# Patient Record
Sex: Female | Born: 1945
Health system: Southern US, Community
[De-identification: ages and names within clinical notes are randomized; demographics above are authoritative.]

## PROBLEM LIST (undated history)

## (undated) DIAGNOSIS — M949 Disorder of cartilage, unspecified: Secondary | ICD-10-CM

## (undated) DIAGNOSIS — M899 Disorder of bone, unspecified: Secondary | ICD-10-CM

## (undated) DIAGNOSIS — E669 Obesity, unspecified: Secondary | ICD-10-CM

## (undated) DIAGNOSIS — K5730 Diverticulosis of large intestine without perforation or abscess without bleeding: Secondary | ICD-10-CM

## (undated) DIAGNOSIS — R51 Headache: Secondary | ICD-10-CM

## (undated) DIAGNOSIS — E785 Hyperlipidemia, unspecified: Secondary | ICD-10-CM

## (undated) DIAGNOSIS — R519 Headache, unspecified: Secondary | ICD-10-CM

## (undated) DIAGNOSIS — N951 Menopausal and female climacteric states: Secondary | ICD-10-CM

## (undated) DIAGNOSIS — Z8639 Personal history of other endocrine, nutritional and metabolic disease: Secondary | ICD-10-CM

## (undated) DIAGNOSIS — H6690 Otitis media, unspecified, unspecified ear: Secondary | ICD-10-CM

## (undated) DIAGNOSIS — I10 Essential (primary) hypertension: Secondary | ICD-10-CM

## (undated) DIAGNOSIS — J984 Other disorders of lung: Secondary | ICD-10-CM

## (undated) DIAGNOSIS — E7800 Pure hypercholesterolemia, unspecified: Secondary | ICD-10-CM

## (undated) DIAGNOSIS — J309 Allergic rhinitis, unspecified: Secondary | ICD-10-CM

## (undated) DIAGNOSIS — R002 Palpitations: Secondary | ICD-10-CM

## (undated) DIAGNOSIS — M1990 Unspecified osteoarthritis, unspecified site: Secondary | ICD-10-CM

## (undated) DIAGNOSIS — F329 Major depressive disorder, single episode, unspecified: Secondary | ICD-10-CM

## (undated) DIAGNOSIS — E039 Hypothyroidism, unspecified: Secondary | ICD-10-CM

## (undated) DIAGNOSIS — H269 Unspecified cataract: Secondary | ICD-10-CM

## (undated) DIAGNOSIS — Z862 Personal history of diseases of the blood and blood-forming organs and certain disorders involving the immune mechanism: Secondary | ICD-10-CM

## (undated) DIAGNOSIS — R42 Dizziness and giddiness: Secondary | ICD-10-CM

## (undated) HISTORY — DX: Unspecified osteoarthritis, unspecified site: M19.90

## (undated) HISTORY — DX: Essential (primary) hypertension: I10

## (undated) HISTORY — PX: THYROID SURGERY: SHX805

## (undated) HISTORY — DX: Major depressive disorder, single episode, unspecified: F32.9

## (undated) HISTORY — DX: Personal history of other endocrine, nutritional and metabolic disease: Z86.39

## (undated) HISTORY — DX: Personal history of diseases of the blood and blood-forming organs and certain disorders involving the immune mechanism: Z86.2

## (undated) HISTORY — DX: Pure hypercholesterolemia, unspecified: E78.00

## (undated) HISTORY — DX: Other disorders of lung: J98.4

## (undated) HISTORY — PX: ABDOMINAL HYSTERECTOMY: SHX81

## (undated) HISTORY — DX: Obesity, unspecified: E66.9

## (undated) HISTORY — DX: Menopausal and female climacteric states: N95.1

## (undated) HISTORY — DX: Allergic rhinitis, unspecified: J30.9

## (undated) HISTORY — DX: Otitis media, unspecified, unspecified ear: H66.90

## (undated) HISTORY — PX: OTHER SURGICAL HISTORY: SHX169

## (undated) HISTORY — DX: Dizziness and giddiness: R42

## (undated) HISTORY — PX: TOTAL HIP ARTHROPLASTY: SHX124

## (undated) HISTORY — DX: Hyperlipidemia, unspecified: E78.5

## (undated) HISTORY — DX: Diverticulosis of large intestine without perforation or abscess without bleeding: K57.30

## (undated) HISTORY — PX: POLYPECTOMY: SHX149

## (undated) HISTORY — DX: Disorder of cartilage, unspecified: M94.9

## (undated) HISTORY — PX: UMBILICAL HERNIA REPAIR: SHX196

## (undated) HISTORY — DX: Unspecified cataract: H26.9

## (undated) HISTORY — DX: Palpitations: R00.2

## (undated) HISTORY — DX: Disorder of bone, unspecified: M89.9

## (undated) HISTORY — DX: Hypothyroidism, unspecified: E03.9

## (undated) HISTORY — PX: COLONOSCOPY: SHX174

---

## 1998-03-22 ENCOUNTER — Encounter: Payer: Self-pay | Admitting: Obstetrics and Gynecology

## 1998-03-22 ENCOUNTER — Ambulatory Visit (HOSPITAL_COMMUNITY): Admission: RE | Admit: 1998-03-22 | Discharge: 1998-03-22 | Payer: Self-pay | Admitting: Obstetrics and Gynecology

## 1999-03-25 ENCOUNTER — Ambulatory Visit (HOSPITAL_COMMUNITY): Admission: RE | Admit: 1999-03-25 | Discharge: 1999-03-25 | Payer: Self-pay | Admitting: Obstetrics and Gynecology

## 1999-03-25 ENCOUNTER — Encounter: Payer: Self-pay | Admitting: Obstetrics and Gynecology

## 1999-03-28 ENCOUNTER — Encounter: Payer: Self-pay | Admitting: Obstetrics and Gynecology

## 1999-03-28 ENCOUNTER — Ambulatory Visit (HOSPITAL_COMMUNITY): Admission: RE | Admit: 1999-03-28 | Discharge: 1999-03-28 | Payer: Self-pay | Admitting: Obstetrics and Gynecology

## 2000-04-09 ENCOUNTER — Encounter: Payer: Self-pay | Admitting: Obstetrics and Gynecology

## 2000-04-09 ENCOUNTER — Ambulatory Visit (HOSPITAL_COMMUNITY): Admission: RE | Admit: 2000-04-09 | Discharge: 2000-04-09 | Payer: Self-pay | Admitting: Obstetrics and Gynecology

## 2000-10-05 ENCOUNTER — Other Ambulatory Visit: Admission: RE | Admit: 2000-10-05 | Discharge: 2000-10-05 | Payer: Self-pay | Admitting: Internal Medicine

## 2000-10-05 ENCOUNTER — Encounter (INDEPENDENT_AMBULATORY_CARE_PROVIDER_SITE_OTHER): Payer: Self-pay | Admitting: *Deleted

## 2000-11-01 ENCOUNTER — Encounter: Payer: Self-pay | Admitting: Orthopedic Surgery

## 2000-11-08 ENCOUNTER — Encounter: Payer: Self-pay | Admitting: Orthopedic Surgery

## 2000-11-08 ENCOUNTER — Inpatient Hospital Stay (HOSPITAL_COMMUNITY): Admission: RE | Admit: 2000-11-08 | Discharge: 2000-11-12 | Payer: Self-pay | Admitting: Orthopedic Surgery

## 2002-03-29 ENCOUNTER — Encounter: Payer: Self-pay | Admitting: Obstetrics and Gynecology

## 2002-03-29 ENCOUNTER — Ambulatory Visit (HOSPITAL_COMMUNITY): Admission: RE | Admit: 2002-03-29 | Discharge: 2002-03-29 | Payer: Self-pay | Admitting: Obstetrics and Gynecology

## 2002-04-18 ENCOUNTER — Emergency Department (HOSPITAL_COMMUNITY): Admission: EM | Admit: 2002-04-18 | Discharge: 2002-04-18 | Payer: Self-pay | Admitting: *Deleted

## 2002-05-16 ENCOUNTER — Other Ambulatory Visit: Admission: RE | Admit: 2002-05-16 | Discharge: 2002-05-16 | Payer: Self-pay | Admitting: Obstetrics and Gynecology

## 2003-02-17 HISTORY — PX: TOTAL HIP ARTHROPLASTY: SHX124

## 2003-04-11 ENCOUNTER — Ambulatory Visit (HOSPITAL_COMMUNITY): Admission: RE | Admit: 2003-04-11 | Discharge: 2003-04-11 | Payer: Self-pay | Admitting: Obstetrics and Gynecology

## 2003-05-02 ENCOUNTER — Inpatient Hospital Stay (HOSPITAL_COMMUNITY): Admission: RE | Admit: 2003-05-02 | Discharge: 2003-05-06 | Payer: Self-pay | Admitting: Orthopedic Surgery

## 2003-06-26 ENCOUNTER — Other Ambulatory Visit: Admission: RE | Admit: 2003-06-26 | Discharge: 2003-06-26 | Payer: Self-pay | Admitting: Obstetrics and Gynecology

## 2003-12-21 ENCOUNTER — Ambulatory Visit: Admission: RE | Admit: 2003-12-21 | Discharge: 2003-12-21 | Payer: Self-pay | Admitting: General Surgery

## 2004-03-28 ENCOUNTER — Ambulatory Visit: Payer: Self-pay | Admitting: Internal Medicine

## 2004-04-11 ENCOUNTER — Ambulatory Visit (HOSPITAL_COMMUNITY): Admission: RE | Admit: 2004-04-11 | Discharge: 2004-04-11 | Payer: Self-pay | Admitting: Internal Medicine

## 2004-05-09 ENCOUNTER — Ambulatory Visit: Payer: Self-pay | Admitting: Internal Medicine

## 2004-07-29 ENCOUNTER — Ambulatory Visit (HOSPITAL_COMMUNITY): Admission: EM | Admit: 2004-07-29 | Discharge: 2004-07-29 | Payer: Self-pay | Admitting: Emergency Medicine

## 2004-09-03 ENCOUNTER — Ambulatory Visit: Payer: Self-pay | Admitting: Internal Medicine

## 2004-09-08 ENCOUNTER — Ambulatory Visit: Payer: Self-pay

## 2005-02-24 ENCOUNTER — Inpatient Hospital Stay (HOSPITAL_COMMUNITY): Admission: AD | Admit: 2005-02-24 | Discharge: 2005-02-24 | Payer: Self-pay | Admitting: Obstetrics and Gynecology

## 2005-04-07 ENCOUNTER — Encounter (INDEPENDENT_AMBULATORY_CARE_PROVIDER_SITE_OTHER): Payer: Self-pay | Admitting: Specialist

## 2005-04-08 ENCOUNTER — Inpatient Hospital Stay (HOSPITAL_COMMUNITY): Admission: RE | Admit: 2005-04-08 | Discharge: 2005-04-09 | Payer: Self-pay | Admitting: Obstetrics and Gynecology

## 2005-05-21 ENCOUNTER — Ambulatory Visit (HOSPITAL_COMMUNITY): Admission: RE | Admit: 2005-05-21 | Discharge: 2005-05-21 | Payer: Self-pay | Admitting: Obstetrics and Gynecology

## 2005-11-17 ENCOUNTER — Ambulatory Visit: Payer: Self-pay | Admitting: Internal Medicine

## 2005-12-01 ENCOUNTER — Encounter: Payer: Self-pay | Admitting: Internal Medicine

## 2005-12-01 ENCOUNTER — Ambulatory Visit: Payer: Self-pay | Admitting: Internal Medicine

## 2005-12-25 ENCOUNTER — Ambulatory Visit: Payer: Self-pay | Admitting: Internal Medicine

## 2006-01-01 ENCOUNTER — Ambulatory Visit: Payer: Self-pay | Admitting: Internal Medicine

## 2006-01-29 ENCOUNTER — Emergency Department (HOSPITAL_COMMUNITY): Admission: EM | Admit: 2006-01-29 | Discharge: 2006-01-29 | Payer: Self-pay | Admitting: Emergency Medicine

## 2006-05-28 ENCOUNTER — Ambulatory Visit (HOSPITAL_COMMUNITY): Admission: RE | Admit: 2006-05-28 | Discharge: 2006-05-28 | Payer: Self-pay | Admitting: Obstetrics and Gynecology

## 2006-06-21 ENCOUNTER — Ambulatory Visit: Payer: Self-pay | Admitting: Internal Medicine

## 2006-06-21 LAB — CONVERTED CEMR LAB
Bilirubin, Direct: 0.1 mg/dL (ref 0.0–0.3)
Cholesterol: 174 mg/dL (ref 0–200)
HDL: 56.9 mg/dL (ref >39.0)
LDL Cholesterol: 105 mg/dL — ABNORMAL HIGH (ref 0–99)
Total CHOL/HDL Ratio: 3.1
Total Protein: 7.1 g/dL (ref 6.0–8.3)
Triglycerides: 59 mg/dL (ref 0–149)

## 2006-11-25 ENCOUNTER — Encounter: Payer: Self-pay | Admitting: Internal Medicine

## 2006-11-25 DIAGNOSIS — M19019 Primary osteoarthritis, unspecified shoulder: Secondary | ICD-10-CM | POA: Insufficient documentation

## 2006-11-25 DIAGNOSIS — M899 Disorder of bone, unspecified: Secondary | ICD-10-CM

## 2006-11-25 DIAGNOSIS — F329 Major depressive disorder, single episode, unspecified: Secondary | ICD-10-CM

## 2006-11-25 DIAGNOSIS — F3289 Other specified depressive episodes: Secondary | ICD-10-CM

## 2006-11-25 DIAGNOSIS — E210 Primary hyperparathyroidism: Secondary | ICD-10-CM | POA: Insufficient documentation

## 2006-11-25 DIAGNOSIS — N951 Menopausal and female climacteric states: Secondary | ICD-10-CM | POA: Insufficient documentation

## 2006-11-25 DIAGNOSIS — J984 Other disorders of lung: Secondary | ICD-10-CM | POA: Insufficient documentation

## 2006-11-25 DIAGNOSIS — E039 Hypothyroidism, unspecified: Secondary | ICD-10-CM

## 2006-11-25 DIAGNOSIS — M949 Disorder of cartilage, unspecified: Secondary | ICD-10-CM

## 2006-11-25 DIAGNOSIS — M1990 Unspecified osteoarthritis, unspecified site: Secondary | ICD-10-CM

## 2006-11-25 DIAGNOSIS — E7800 Pure hypercholesterolemia, unspecified: Secondary | ICD-10-CM | POA: Insufficient documentation

## 2006-11-25 DIAGNOSIS — F32A Depression, unspecified: Secondary | ICD-10-CM | POA: Insufficient documentation

## 2006-11-25 DIAGNOSIS — Z8639 Personal history of other endocrine, nutritional and metabolic disease: Secondary | ICD-10-CM

## 2006-11-25 DIAGNOSIS — Z862 Personal history of diseases of the blood and blood-forming organs and certain disorders involving the immune mechanism: Secondary | ICD-10-CM

## 2006-11-25 HISTORY — DX: Disorder of bone, unspecified: M89.9

## 2006-11-25 HISTORY — DX: Other disorders of lung: J98.4

## 2006-11-25 HISTORY — DX: Menopausal and female climacteric states: N95.1

## 2006-11-25 HISTORY — DX: Unspecified osteoarthritis, unspecified site: M19.90

## 2006-11-25 HISTORY — DX: Pure hypercholesterolemia, unspecified: E78.00

## 2006-11-25 HISTORY — DX: Personal history of diseases of the blood and blood-forming organs and certain disorders involving the immune mechanism: Z86.39

## 2006-11-25 HISTORY — DX: Hypothyroidism, unspecified: E03.9

## 2006-11-25 HISTORY — DX: Personal history of diseases of the blood and blood-forming organs and certain disorders involving the immune mechanism: Z86.2

## 2006-11-25 HISTORY — DX: Major depressive disorder, single episode, unspecified: F32.9

## 2006-11-25 HISTORY — DX: Other specified depressive episodes: F32.89

## 2006-12-04 DIAGNOSIS — E669 Obesity, unspecified: Secondary | ICD-10-CM

## 2006-12-04 DIAGNOSIS — K5730 Diverticulosis of large intestine without perforation or abscess without bleeding: Secondary | ICD-10-CM

## 2006-12-04 HISTORY — DX: Obesity, unspecified: E66.9

## 2006-12-04 HISTORY — DX: Diverticulosis of large intestine without perforation or abscess without bleeding: K57.30

## 2007-05-31 ENCOUNTER — Ambulatory Visit (HOSPITAL_COMMUNITY): Admission: RE | Admit: 2007-05-31 | Discharge: 2007-05-31 | Payer: Self-pay | Admitting: Obstetrics and Gynecology

## 2007-07-15 ENCOUNTER — Ambulatory Visit: Payer: Self-pay | Admitting: Internal Medicine

## 2007-07-15 DIAGNOSIS — I10 Essential (primary) hypertension: Secondary | ICD-10-CM | POA: Insufficient documentation

## 2007-07-15 HISTORY — DX: Essential (primary) hypertension: I10

## 2008-03-13 ENCOUNTER — Ambulatory Visit: Payer: Self-pay | Admitting: Internal Medicine

## 2008-06-07 ENCOUNTER — Ambulatory Visit (HOSPITAL_COMMUNITY): Admission: RE | Admit: 2008-06-07 | Discharge: 2008-06-07 | Payer: Self-pay | Admitting: Internal Medicine

## 2008-06-07 LAB — HM MAMMOGRAPHY: HM Mammogram: NEGATIVE

## 2008-07-20 ENCOUNTER — Ambulatory Visit: Payer: Self-pay | Admitting: Internal Medicine

## 2008-07-20 DIAGNOSIS — R002 Palpitations: Secondary | ICD-10-CM

## 2008-07-20 HISTORY — DX: Palpitations: R00.2

## 2009-06-13 ENCOUNTER — Ambulatory Visit (HOSPITAL_COMMUNITY): Admission: RE | Admit: 2009-06-13 | Discharge: 2009-06-13 | Payer: Self-pay | Admitting: Obstetrics and Gynecology

## 2009-10-28 ENCOUNTER — Telehealth: Payer: Self-pay | Admitting: Internal Medicine

## 2009-11-18 ENCOUNTER — Encounter: Payer: Self-pay | Admitting: Internal Medicine

## 2009-11-18 ENCOUNTER — Ambulatory Visit: Payer: Self-pay | Admitting: Internal Medicine

## 2009-11-18 DIAGNOSIS — R42 Dizziness and giddiness: Secondary | ICD-10-CM | POA: Insufficient documentation

## 2009-11-18 DIAGNOSIS — H6690 Otitis media, unspecified, unspecified ear: Secondary | ICD-10-CM | POA: Insufficient documentation

## 2009-11-18 DIAGNOSIS — E785 Hyperlipidemia, unspecified: Secondary | ICD-10-CM

## 2009-11-18 HISTORY — DX: Otitis media, unspecified, unspecified ear: H66.90

## 2009-11-18 HISTORY — DX: Hyperlipidemia, unspecified: E78.5

## 2009-11-18 HISTORY — DX: Dizziness and giddiness: R42

## 2009-11-18 LAB — CONVERTED CEMR LAB
Albumin: 4.1 g/dL (ref 3.5–5.2)
Alkaline Phosphatase: 50 units/L (ref 39–117)
BUN: 15 mg/dL (ref 6–23)
Basophils Absolute: 0 10*3/uL (ref 0.0–0.1)
Bilirubin, Direct: 0.1 mg/dL (ref 0.0–0.3)
CO2: 27 meq/L (ref 19–32)
Calcium: 10.6 mg/dL — ABNORMAL HIGH (ref 8.4–10.5)
Cholesterol: 185 mg/dL (ref 0–200)
Creatinine, Ser: 0.8 mg/dL (ref 0.4–1.2)
Eosinophils Absolute: 0.2 10*3/uL (ref 0.0–0.7)
GFR calc non Af Amer: 87.17 mL/min (ref >60)
Glucose, Bld: 99 mg/dL (ref 70–99)
HDL: 61.8 mg/dL (ref >39.00)
Leukocytes, UA: NEGATIVE
Lymphocytes Relative: 19.7 % (ref 12.0–46.0)
MCHC: 34.2 g/dL (ref 30.0–36.0)
Monocytes Relative: 11.5 % (ref 3.0–12.0)
Neutro Abs: 3 10*3/uL (ref 1.4–7.7)
Neutrophils Relative %: 64.5 % (ref 43.0–77.0)
Platelets: 239 10*3/uL (ref 150.0–400.0)
RDW: 13.6 % (ref 11.5–14.6)
Specific Gravity, Urine: 1.02 (ref 1.000–1.030)
Total Bilirubin: 0.6 mg/dL (ref 0.3–1.2)
Urine Glucose: NEGATIVE mg/dL
Urobilinogen, UA: 1 (ref 0.0–1.0)
pH: 7 (ref 5.0–8.0)

## 2009-12-13 ENCOUNTER — Encounter: Payer: Self-pay | Admitting: Internal Medicine

## 2009-12-17 ENCOUNTER — Ambulatory Visit (HOSPITAL_COMMUNITY): Admission: RE | Admit: 2009-12-17 | Discharge: 2009-12-17 | Payer: Self-pay | Admitting: Internal Medicine

## 2009-12-17 ENCOUNTER — Ambulatory Visit: Payer: Self-pay

## 2009-12-17 ENCOUNTER — Encounter: Payer: Self-pay | Admitting: Internal Medicine

## 2009-12-17 ENCOUNTER — Ambulatory Visit: Payer: Self-pay | Admitting: Cardiovascular Disease

## 2010-03-08 ENCOUNTER — Encounter: Payer: Self-pay | Admitting: Orthopedic Surgery

## 2010-03-16 LAB — CONVERTED CEMR LAB
ALT: 15 units/L (ref 0–35)
ALT: 17 units/L (ref 0–35)
AST: 21 units/L (ref 0–37)
Albumin: 3.8 g/dL (ref 3.5–5.2)
Alkaline Phosphatase: 48 units/L (ref 39–117)
BUN: 13 mg/dL (ref 6–23)
BUN: 8 mg/dL (ref 6–23)
Basophils Absolute: 0 10*3/uL (ref 0.0–0.1)
Basophils Relative: 0 % (ref 0.0–1.0)
Basophils Relative: 0 % (ref 0.0–3.0)
Bilirubin, Direct: 0.3 mg/dL (ref 0.0–0.3)
CO2: 29 meq/L (ref 19–32)
Calcium: 10.6 mg/dL — ABNORMAL HIGH (ref 8.4–10.5)
Calcium: 10.8 mg/dL — ABNORMAL HIGH (ref 8.4–10.5)
Cholesterol: 172 mg/dL (ref 0–200)
Creatinine, Ser: 0.7 mg/dL (ref 0.4–1.2)
Creatinine, Ser: 0.7 mg/dL (ref 0.4–1.2)
Eosinophils Absolute: 0.1 10*3/uL (ref 0.0–0.7)
Eosinophils Relative: 2.8 % (ref 0.0–5.0)
Eosinophils Relative: 2.9 % (ref 0.0–5.0)
Glucose, Bld: 89 mg/dL (ref 70–99)
HDL: 66 mg/dL (ref >39.00)
Hemoglobin, Urine: NEGATIVE
Hemoglobin: 13.2 g/dL (ref 12.0–15.0)
Ketones, ur: NEGATIVE mg/dL
Ketones, ur: NEGATIVE mg/dL
LDL Cholesterol: 110 mg/dL — ABNORMAL HIGH (ref 0–99)
Leukocytes, UA: NEGATIVE
Lymphocytes Relative: 23.3 % (ref 12.0–46.0)
Lymphocytes Relative: 30.8 % (ref 12.0–46.0)
MCHC: 34.2 g/dL (ref 30.0–36.0)
MCV: 88.7 fL (ref 78.0–100.0)
Monocytes Absolute: 0.3 10*3/uL (ref 0.1–1.0)
Neutro Abs: 2.2 10*3/uL (ref 1.4–7.7)
Neutrophils Relative %: 65 % (ref 43.0–77.0)
Platelets: 237 10*3/uL (ref 150.0–400.0)
RBC: 4.24 M/uL (ref 3.87–5.11)
RBC: 4.36 M/uL (ref 3.87–5.11)
Specific Gravity, Urine: 1.01 (ref 1.000–1.030)
Specific Gravity, Urine: 1.015 (ref 1.000–1.03)
TSH: 0.77 microintl units/mL (ref 0.35–5.50)
TSH: 2.15 microintl units/mL (ref 0.35–5.50)
Total Bilirubin: 0.7 mg/dL (ref 0.3–1.2)
Total Bilirubin: 0.8 mg/dL (ref 0.3–1.2)
Total CHOL/HDL Ratio: 3
Total Protein: 7.1 g/dL (ref 6.0–8.3)
Total Protein: 7.5 g/dL (ref 6.0–8.3)
Triglycerides: 50 mg/dL (ref 0–149)
Triglycerides: 59 mg/dL (ref 0.0–149.0)
Urine Glucose: NEGATIVE mg/dL
Urine Glucose: NEGATIVE mg/dL
Urobilinogen, UA: 0.2 (ref 0.0–1.0)
Vit D, 1,25-Dihydroxy: 29 — ABNORMAL LOW (ref 30–89)
WBC: 3.5 10*3/uL — ABNORMAL LOW (ref 4.5–10.5)
pH: 6 (ref 5.0–8.0)

## 2010-03-20 NOTE — Progress Notes (Signed)
  Phone Note Outgoing Call   Call placed by: Robin Call placed to: Patient Summary of Call: Called patient to inform the need to schedule office visit with Dr. Jonny Ruiz as has not been seen since June of 2010. Informed patient that to continue getting refills an OV is due and she agreed to call back and schedule appt. Initial call taken by: Robin Ewing CMA Duncan Dull),  October 28, 2009 9:44 AM    Prescriptions: LEVOTHYROXINE SODIUM 75 MCG  TABS (LEVOTHYROXINE SODIUM) 1po once daily  #30 x 0   Entered by:   Scharlene Gloss CMA (AAMA)   Authorized by:   Corwin Levins MD   Signed by:   Scharlene Gloss CMA (AAMA) on 10/28/2009   Method used:   Faxed to ...       [facility]* (retail)       [address]       [place], [place]  [id number]       Ph: [phone]       Fax: [phone]   RxID:   [id number] LOVASTATIN 20 MG TABS (LOVASTATIN) 1 by mouth once daily  #30 x 0   Entered by:   Scharlene Gloss CMA (AAMA)   Authorized by:   Corwin Levins MD   Signed by:   Scharlene Gloss CMA (AAMA) on 10/28/2009   Method used:   Faxed to ...       [facility] (retail)       [address]       [place], [place]  [address]       Ph: [phone]       Fax: [phone]   RxID:   [id number] LISINOPRIL 10 MG TABS (LISINOPRIL) 1 by mouth once daily  #30 x 0   Entered by:   Scharlene Gloss CMA (AAMA)   Authorized by:   Corwin Levins MD   Signed by:   Scharlene Gloss CMA (AAMA) on 10/28/2009   Method used:   Faxed to ...       [facility] (retail)       [address]       [place], [place]  [id number]       Ph: [phone]       Fax: [phone]   RxID:   [id number] ALENDRONATE SODIUM 70 MG  TABS (ALENDRONATE SODIUM) 1po every week  #4 x 0   Entered by:   Scharlene Gloss CMA (AAMA)   Authorized by:   Corwin Levins MD   Signed by:   Scharlene Gloss CMA (AAMA) on 10/28/2009   Method used:   Faxed to ...       [facility] (retail)       [address]       [place], [place]  [address]       Ph: [phone]       Fax: [phone]   RxID:   [id number]

## 2010-03-20 NOTE — Assessment & Plan Note (Signed)
Summary: FU ON MEDS/NWS   Vital Signs:  Patient profile:   65 year old female Height:      60 inches Weight:      212.13 pounds BMI:     41.58 O2 Sat:      99 % on Room air Temp:     99.1 degrees F oral Pulse rate:   106 / minute BP sitting:   108 / 74  (left arm) Cuff size:   large  Vitals Entered By: Zella Ball Ewing CMA Duncan Dull) (November 18, 2009 3:27 PM)  O2 Flow:  Room air  Preventive Care Screening  Bone Density:    Date:  02/17/2008    Next Due:  02/2010    Results:  abnormal std dev     decliens flu shot, pneumovax  CC: followup/RE   CC:  followup/RE.  History of Present Illness: here with wellness and acute - feels off balalnce intermittent for many months, can go away for 2 wks then return;  Pt denies CP, worsening sob, doe, wheezing, orthopnea, pnd, worsening LE edema, palps, dizziness or syncope  Pt denies new neuro symptoms such as headache, facial or extremity weakness  Pt denies polydipsia, polyuria.  Overall good compliance with meds, trying to follow low chol diet, wt stable, little excercise however No  wt loss, night sweats, loss of appetite or other constitutional symptoms , but does feel some warm today, and left earache for several days, without reduced hearing or vertigo. No falls or injury  Preventive Screening-Counseling & Management  Alcohol-Tobacco     Smoking Status: never      Drug Use:  no.    Problems Prior to Update: 1)  Otitis Media, Acute, Left  (ICD-382.9) 2)  Dizziness  (ICD-780.4) 3)  Hyperlipidemia  (ICD-272.4) 4)  Palpitations, Recurrent  (ICD-785.1) 5)  Hypertension  (ICD-401.9) 6)  Preventive Health Care  (ICD-V70.0) 7)  Obesity  (ICD-278.00) 8)  Diverticulosis, Colon  (ICD-562.10) 9)  Postmenopausal Status  (ICD-627.2) 10)  Degenerative Joint Disease  (ICD-715.90) 11)  Hyperparathyroidism, Hx of  (ICD-V12.2) 12)  Hypercholesterolemia  (ICD-272.0) 13)  Pulmonary Nodule  (ICD-518.89) 14)  Osteopenia  (ICD-733.90) 15)   Hypothyroidism  (ICD-244.9) 16)  Depression  (ICD-311)  Medications Prior to Update: 1)  Alendronate Sodium 70 Mg  Tabs (Alendronate Sodium) .Marland Kitchen.. 1po Every Week 2)  Vitamin D3 1000 Unit Tabs (Cholecalciferol) .... Take 1 Tablet By Mouth Once A Day 3)  Levothyroxine Sodium 75 Mcg  Tabs (Levothyroxine Sodium) .Marland Kitchen.. 1po Once Daily 4)  Lisinopril 10 Mg Tabs (Lisinopril) .Marland Kitchen.. 1 By Mouth Once Daily 5)  Lovastatin 20 Mg Tabs (Lovastatin) .Marland Kitchen.. 1 By Mouth Once Daily 6)  Adult Aspirin Ec Low Strength 81 Mg  Tbec (Aspirin) .Marland Kitchen.. 1po Once Daily 7)  Vitamin D3 1000 Unit  Tabs (Cholecalciferol) .Marland Kitchen.. 1 By Mouth Once Daily 8)  Vitamin D (Ergocalciferol) 50000 Unit Caps (Ergocalciferol) .Marland Kitchen.. 1 By Mouth Once Daily  Current Medications (verified): 1)  Alendronate Sodium 70 Mg  Tabs (Alendronate Sodium) .Marland Kitchen.. 1po Every Week 2)  Levothyroxine Sodium 75 Mcg  Tabs (Levothyroxine Sodium) .Marland Kitchen.. 1po Once Daily 3)  Lisinopril 5 Mg Tabs (Lisinopril) .Marland Kitchen.. 1 By Mouth Once Daily 4)  Lovastatin 20 Mg Tabs (Lovastatin) .Marland Kitchen.. 1 By Mouth Once Daily 5)  Adult Aspirin Ec Low Strength 81 Mg  Tbec (Aspirin) .Marland Kitchen.. 1po Once Daily 6)  Vitamin D3 1000 Unit  Tabs (Cholecalciferol) .Marland Kitchen.. 1 By Mouth Once Daily 7)  Amoxicillin 500 Mg Caps (  Amoxicillin) .... 2po Two Times A Day  Allergies (verified): No Known Drug Allergies  Past History:  Past Medical History: Last updated: 07/15/2007 Depression Hypothyroidism Osteopenia Diverticulosis, colon Hyperparathyroidism DJD-Bilateral Hip Hyperlipidemia Hx of Benign Pulmonary Nodule Obesity low vit D Hypertension  Past Surgical History: Last updated: 12/04/2006 left knee right wrist Hysterectomy Total hip replacement- Left 3/05, Right 2002  Family History: Last updated: 07/15/2007 HTN  Social History: Last updated: 11/18/2009 Retired Alcohol use-no Drug use-no Never Smoked  Risk Factors: Smoking Status: never (11/18/2009)  Family History: Reviewed history from  07/15/2007 and no changes required. HTN  Social History: Reviewed history from 07/15/2007 and no changes required. Retired Alcohol use-no Drug use-no Never Smoked Drug Use:  no Smoking Status:  never  Review of Systems  The patient denies anorexia, fever, vision loss, decreased hearing, hoarseness, chest pain, syncope, dyspnea on exertion, peripheral edema, prolonged cough, headaches, hemoptysis, abdominal pain, melena, hematochezia, severe indigestion/heartburn, hematuria, muscle weakness, suspicious skin lesions, transient blindness, difficulty walking, depression, unusual weight change, abnormal bleeding, enlarged lymph nodes, and angioedema.         all otherwise negative per pt -    Physical Exam  General:  alert and well-developed.   Head:  normocephalic and atraumatic.   Eyes:  vision grossly intact, pupils equal, and pupils round.   Ears:  right tm ok, left TM mod to severe erythema and mild bulging, canals clear Nose:  no external deformity and no nasal discharge.   Mouth:  no gingival abnormalities and pharynx pink and moist.   Neck:  supple and no masses.   Lungs:  normal respiratory effort and normal breath sounds.   Heart:  normal rate, regular rhythm, no murmur, and no gallop.   Abdomen:  soft, non-tender, and normal bowel sounds.   Msk:  no joint tenderness and no joint swelling.   Extremities:  no edema, no ulcers  Neurologic:  cranial nerves II-XII intact and strength normal in all extremities.   Skin:  color normal and no rashes.   Psych:  not anxious appearing and not depressed appearing.     Impression & Recommendations:  Problem # 1:  Preventive Health Care (ICD-V70.0)  Overall doing well, age appropriate education and counseling updated and referral for appropriate preventive services done unless declined, immunizations up to date or declined, diet counseling done if overweight, urged to quit smoking if smokes , most recent labs reviewed and current  ordered if appropriate, ecg reviewed or declined (interpretation per ECG scanned in the EMR if done); information regarding Medicare Prevention requirements given if appropriate; speciality referrals updated as appropriate   Orders: EKG w/ Interpretation (93000) TLB-BMP (Basic Metabolic Panel-BMET) (80048-METABOL) TLB-CBC Platelet - w/Differential (85025-CBCD) TLB-Hepatic/Liver Function Pnl (80076-HEPATIC) TLB-TSH (Thyroid Stimulating Hormone) (84443-TSH) TLB-Udip w/ Micro (81001-URINE)  Problem # 2:  OTITIS MEDIA, ACUTE, LEFT (ICD-382.9)  Her updated medication list for this problem includes:    Adult Aspirin Ec Low Strength 81 Mg Tbec (Aspirin) .Marland Kitchen... 1po once daily    Amoxicillin 500 Mg Caps (Amoxicillin) .Marland Kitchen... 2po two times a day treat as above, f/u any worsening signs or symptoms   Problem # 3:  DIZZINESS (ICD-780.4) unclear etiology except for the left ear as above - also for dopplers and echo Orders: Radiology Referral (Radiology) Echo Referral (Echo)  Problem # 4:  HYPERTENSION (ICD-401.9)  Her updated medication list for this problem includes:    Lisinopril 5 Mg Tabs (Lisinopril) .Marland Kitchen... 1 by mouth once daily ? overcontrolled - to  reduce the ACE as above  Complete Medication List: 1)  Alendronate Sodium 70 Mg Tabs (Alendronate sodium) .Marland Kitchen.. 1po every week 2)  Levothyroxine Sodium 75 Mcg Tabs (Levothyroxine sodium) .Marland Kitchen.. 1po once daily 3)  Lisinopril 5 Mg Tabs (Lisinopril) .Marland Kitchen.. 1 by mouth once daily 4)  Lovastatin 20 Mg Tabs (Lovastatin) .Marland Kitchen.. 1 by mouth once daily 5)  Adult Aspirin Ec Low Strength 81 Mg Tbec (Aspirin) .Marland Kitchen.. 1po once daily 6)  Vitamin D3 1000 Unit Tabs (Cholecalciferol) .Marland Kitchen.. 1 by mouth once daily 7)  Amoxicillin 500 Mg Caps (Amoxicillin) .... 2po two times a day  Other Orders: Admin 1st Vaccine (40347) DT Vaccine (42595) Tdap => 45yrs IM (63875) Admin of Any Addtl Vaccine (64332) TLB-Lipid Panel (80061-LIPID)  Patient Instructions: 1)  you had the  tetanus today 2)  Please take all new medications as prescribed - the antibiotic 3)  You can also use Mucinex OTC or it's generic for congestion  4)  Your EKG was OK today 5)  You will be contacted about the referral(s) to: carotid dopplers (neck arteries), and Echocardiogram (heart ultrasound) 6)  Please go to the Lab in the basement for your blood and/or urine tests today 7)  decrease the lisinopril to 5 mg per day 8)  Please call the number on the [facility] Card for results of your testing  9)  Please schedule a follow-up appointment in 6 months. Prescriptions: ALENDRONATE SODIUM 70 MG  TABS (ALENDRONATE SODIUM) 1po every week  #12 x 3   Entered and Authorized by:   Corwin Levins MD   Signed by:   Corwin Levins MD on 11/18/2009   Method used:   Print then Give to Patient   RxID:   [id number] LEVOTHYROXINE SODIUM 75 MCG  TABS (LEVOTHYROXINE SODIUM) 1po once daily  #90 x 3   Entered and Authorized by:   Corwin Levins MD   Signed by:   Corwin Levins MD on 11/18/2009   Method used:   Print then Give to Patient   RxID:   [id number] LOVASTATIN 20 MG TABS (LOVASTATIN) 1 by mouth once daily  #90 x 3   Entered and Authorized by:   Corwin Levins MD   Signed by:   Corwin Levins MD on 11/18/2009   Method used:   Print then Give to Patient   RxID:   [id number] LISINOPRIL 5 MG TABS (LISINOPRIL) 1 by mouth once daily  #90 x 3   Entered and Authorized by:   Corwin Levins MD   Signed by:   Corwin Levins MD on 11/18/2009   Method used:   Print then Give to Patient   RxID:   [id number] AMOXICILLIN 500 MG CAPS (AMOXICILLIN) 2po two times a day  #40 x 0   Entered and Authorized by:   Corwin Levins MD   Signed by:   Corwin Levins MD on 11/18/2009   Method used:   Electronically to        [facility] (retail)       [address]       [place], [place]  [address]       Ph: [phone]       Fax: [phone]   RxID:   [id number]    Immunizations  Administered:  Tetanus Vaccine:    Vaccine Type: Tdap    Site: left deltoid    Mfr: GlaxoSmithKline    Dose: 0.5 ml  Route: IM    Given by: Zella Ball Ewing CMA (AAMA)    Exp. Date: 12/06/2011    Lot #: [id number]    VIS given: 01/04/08 version given November 18, 2009.

## 2010-03-20 NOTE — Miscellaneous (Signed)
Summary: Appointment Canceled  Appointment status changed to canceled by [employer] on 11/27/2009 10:22 AM.  Cancellation Comments --------------------- echo/dizziness/jml  Appointment Information ----------------------- Appt Type:  CARDIOLOGY ANCILLARY VISIT      Date:  Wednesday, December 04, 2009      Time:  1:00 PM for 60 min   Urgency:  Routine   Made By:  Hoy Finlay Scheduler  To Visit:  [id number]    Reason:  echo/dizziness/jml  Appt Comments ------------- -- 11/27/09 10:22: ([facility]) CANCELED -- echo/dizziness/jml -- 11/19/09 11:46: ([facility]) BOOKED -- Routine CARDIOLOGY ANCILLARY VISIT at 12/04/2009 1:00 PM for 60 min echo/dizziness/jml -- 11/19/09 10:18: ([facility]) BOOKED -- Routine CARDIOLOGY ANCILLARY VISIT

## 2010-03-20 NOTE — Miscellaneous (Signed)
Summary: Orders Update  Clinical Lists Changes  Orders: Added new Test order of Carotid Duplex (Carotid Duplex) - Signed 

## 2010-05-22 ENCOUNTER — Other Ambulatory Visit: Payer: Self-pay | Admitting: Internal Medicine

## 2010-05-22 DIAGNOSIS — Z1231 Encounter for screening mammogram for malignant neoplasm of breast: Secondary | ICD-10-CM

## 2010-05-26 ENCOUNTER — Ambulatory Visit: Payer: Self-pay | Admitting: Internal Medicine

## 2010-06-08 ENCOUNTER — Encounter: Payer: Self-pay | Admitting: Internal Medicine

## 2010-06-08 DIAGNOSIS — Z0001 Encounter for general adult medical examination with abnormal findings: Secondary | ICD-10-CM | POA: Insufficient documentation

## 2010-06-13 ENCOUNTER — Ambulatory Visit (INDEPENDENT_AMBULATORY_CARE_PROVIDER_SITE_OTHER): Payer: Medicare Other | Admitting: Internal Medicine

## 2010-06-13 ENCOUNTER — Encounter: Payer: Self-pay | Admitting: Internal Medicine

## 2010-06-13 VITALS — BP 106/62 | HR 113 | Temp 99.3°F | Ht 59.0 in | Wt 203.8 lb

## 2010-06-13 DIAGNOSIS — I10 Essential (primary) hypertension: Secondary | ICD-10-CM

## 2010-06-13 DIAGNOSIS — E785 Hyperlipidemia, unspecified: Secondary | ICD-10-CM

## 2010-06-13 DIAGNOSIS — F329 Major depressive disorder, single episode, unspecified: Secondary | ICD-10-CM

## 2010-06-13 DIAGNOSIS — Z0000 Encounter for general adult medical examination without abnormal findings: Secondary | ICD-10-CM

## 2010-06-13 DIAGNOSIS — F3289 Other specified depressive episodes: Secondary | ICD-10-CM

## 2010-06-13 DIAGNOSIS — E039 Hypothyroidism, unspecified: Secondary | ICD-10-CM

## 2010-06-13 NOTE — Patient Instructions (Signed)
Please ask your pharmacist about the alendronate;  If you have been taking it more than 5 yrs, it is ok to stop Continue all other medications as before Please return in 6 mo with Lab testing done 3-5 days before

## 2010-06-14 ENCOUNTER — Encounter: Payer: Self-pay | Admitting: Internal Medicine

## 2010-06-14 NOTE — Assessment & Plan Note (Signed)
stable overall by hx and exam, most recent lab reviewed with pt, and pt to continue medical treatment as before  BP Readings from Last 3 Encounters:  06/13/10 106/62  11/18/09 108/74  07/20/08 136/82

## 2010-06-14 NOTE — Assessment & Plan Note (Signed)
stable overall by hx and exam, most recent lab reviewed with pt, and pt to continue medical treatment as before  Lab Results  Component Value Date   LDLCALC 106* 11/18/2009    declnies f/u lab today

## 2010-06-14 NOTE — Progress Notes (Signed)
Subjective:    Patient ID: [name], female    DOB: 06-Apr-1945, 65 y.o.   MRN: [id number]  HPI Here to f/u; overall doing ok,  Pt denies chest pain, increased sob or doe, wheezing, orthopnea, PND, increased LE swelling, palpitations, dizziness or syncope.  Pt denies new neurological symptoms such as new headache, or facial or extremity weakness or numbness   Pt denies polydipsia, polyuria  Pt states overall good compliance with meds, trying to follow lower cholesterol diet, wt overall stable but little exercise however.  Not sure if taken the alendronate more than 5 yrs, which is what she heard may be a good time to stop.  Denies hyper or hypo thyroid symptoms such as voice, skin or hair change.  No other new complaints today.    Pt denies fever, wt loss, night sweats, loss of appetite, or other constitutional symptoms  Denies worsening depressive symptoms, suicidal ideation, or panic, though has ongoing anxiety, not increased recently.    Past Medical History  Diagnosis Date  . HYPOTHYROIDISM 11/25/2006  . HYPERCHOLESTEROLEMIA 11/25/2006  . HYPERLIPIDEMIA 11/18/2009  . OBESITY 12/04/2006  . DEPRESSION 11/25/2006  . OTITIS MEDIA, ACUTE, LEFT 11/18/2009  . HYPERTENSION 07/15/2007  . PULMONARY NODULE 11/25/2006  . DIVERTICULOSIS, COLON 12/04/2006  . POSTMENOPAUSAL STATUS 11/25/2006  . DEGENERATIVE JOINT DISEASE 11/25/2006  . OSTEOPENIA 11/25/2006  . DIZZINESS 11/18/2009  . PALPITATIONS, RECURRENT 07/20/2008  . HYPERPARATHYROIDISM, HX OF 11/25/2006  . Vitamin D deficiency   . DJD (degenerative joint disease)     Bilateral Hip   Past Surgical History  Procedure Date  . Left knee   . Right wrist   . Abdominal hysterectomy   . Total hip arthroplasty     left 3/05, right 2002    reports that she has never smoked. She does not have any smokeless tobacco history on file. She reports that she does not drink alcohol or use illicit drugs. family history includes Hypertension in her other. No Known  Allergies Current Outpatient Prescriptions on File Prior to Visit  Medication Sig Dispense Refill  . aspirin 81 MG EC tablet Take 81 mg by mouth daily.        . Cholecalciferol (VITAMIN D3) 1000 UNITS capsule Take 1,000 Units by mouth daily.        Marland Kitchen levothyroxine (SYNTHROID, LEVOTHROID) 75 MCG tablet Take 75 mcg by mouth daily.        Marland Kitchen lisinopril (PRINIVIL,ZESTRIL) 5 MG tablet Take 5 mg by mouth daily.        Marland Kitchen lovastatin (MEVACOR) 20 MG tablet Take 20 mg by mouth daily.        Marland Kitchen alendronate (FOSAMAX) 70 MG tablet Take 70 mg by mouth every 7 (seven) days. Take in the morning with a full glass of water, on an empty stomach, and do not take anything else by mouth or lie down for the next 30 min.       Marland Kitchen DISCONTD: amoxicillin (AMOXIL) 500 MG capsule Take 500 mg by mouth. 2 po two times per day        Review of Systems Review of Systems  Constitutional: Negative for diaphoresis and unexpected weight change.  HENT: Negative for drooling and tinnitus.   Eyes: Negative for photophobia and visual disturbance.  Respiratory: Negative for choking and stridor.   Gastrointestinal: Negative for vomiting and blood in stool.  Genitourinary: Negative for hematuria and decreased urine volume.  Musculoskeletal: Negative for gait problem.  Skin: Negative for color change  and wound.  Neurological: Negative for tremors and numbness.  Psychiatric/Behavioral: Negative for decreased concentration. The patient is not hyperactive.       Objective:   Physical Exam BP 106/62  Pulse 113  Temp(Src) 99.3 F (37.4 C) (Oral)  Ht 4\' 11"  (1.499 m)  Wt 203 lb 12 oz (92.42 kg)  BMI 41.15 kg/m2  SpO2 95% Physical Exam  VS noted, obese Constitutional: Pt appears well-developed and well-nourished.  HENT: Head: Normocephalic.  Right Ear: External ear normal.  Left Ear: External ear normal.  Eyes: Conjunctivae and EOM are normal. Pupils are equal, round, and reactive to light.  Neck: Normal range of motion. Neck  supple.  Cardiovascular: Normal rate and regular rhythm.   Pulmonary/Chest: Effort normal and breath sounds normal.  Abd:  Soft, NT, non-distended, + BS Neurological: Pt is alert. No cranial nerve deficit.  Skin: Skin is warm. No erythema.  Psychiatric: Pt behavior is normal. Thought content normal.  Not depressed affect, mild nervous at best        Assessment & Plan:

## 2010-06-14 NOTE — Assessment & Plan Note (Signed)
stable overall by hx and exam, most recent lab reviewed with pt, and pt to continue medical treatment as before  Lab Results  Component Value Date   WBC 4.6 11/18/2009   HGB 13.0 11/18/2009   HCT 38.0 11/18/2009   PLT 239.0 11/18/2009   CHOL 185 11/18/2009   TRIG 87.0 11/18/2009   HDL 61.80 11/18/2009   ALT 12 11/18/2009   AST 20 11/18/2009   NA 136 11/18/2009   K 4.3 11/18/2009   CL 103 11/18/2009   CREATININE 0.8 11/18/2009   BUN 15 11/18/2009   CO2 27 11/18/2009   TSH 2.89 11/18/2009

## 2010-06-14 NOTE — Assessment & Plan Note (Signed)
stable overall by hx and exam, most recent lab reviewed with pt, and pt to continue medical treatment as before  Lab Results  Component Value Date   TSH 2.89 11/18/2009

## 2010-06-20 ENCOUNTER — Ambulatory Visit (HOSPITAL_COMMUNITY)
Admission: RE | Admit: 2010-06-20 | Discharge: 2010-06-20 | Disposition: A | Discharge status: Home / Self Care | Payer: Medicare Other | Source: Ambulatory Visit | Attending: Internal Medicine | Admitting: Internal Medicine

## 2010-06-20 DIAGNOSIS — Z1231 Encounter for screening mammogram for malignant neoplasm of breast: Secondary | ICD-10-CM | POA: Insufficient documentation

## 2010-07-04 NOTE — Discharge Summary (Signed)
NAME:  [name], [name]                         ACCOUNT NO.:  [id number]   MEDICAL RECORD NO.:  [id number]                   PATIENT TYPE:  INP   LOCATION:  0479                                 FACILITY:  [facility]   PHYSICIAN:  Ollen Gross, M.D.                 DATE OF BIRTH:  02-14-1946   DATE OF ADMISSION:  05/02/2003  DATE OF DISCHARGE:  05/06/2003                                 DISCHARGE SUMMARY   ADMISSION DIAGNOSES:  1. Osteoarthritis, left hip.  2. Hypothyroidism.  3. History of depression.   DISCHARGE DIAGNOSES:  1. Osteoarthritis, left hip, status post left total hip replacement     arthroplasty.  2. Mild postoperative hyponatremia.  3. A 6 to 7 mm nodule, right lower lobe per CT scan.  4. A 13 mm low density lesion in liver per CT scan.   CONSULTATIONS:  [facility], Dr. Willow Ora.   HISTORY:  The patient is a 65 year old female with severe end-stage  osteoarthritis of the left hip that has been refractory to nonoperative  management.  Has previously had a successful right total knee arthroplasty,  now presents for a left total hip surgery.   LABORATORY DATA:  CBC on admission:  Hemoglobin of 12.5, hematocrit 37.5,  white blood cell count normal at 4.1, red cell count 4.36, differential all  within normal limits.  Postoperative H&H 10.9 and 32.9.  Last noted H&H 10.3  and 30.6.  PT and PTT preoperatively were 12.9 and 33, respectively, with an  INR normal at 1.  Serial prothrombin times were followed.  Last noted PT and  INR were 19.9 and 2.1.  Chem panel on admission all within normal limits.  Serial BMETs were followed.  Sodium had a slight drop from 138 down to 134,  glucose went up from 95 to 142, back down to 132.  TSH level low-normal at  0.564.  Urine cloudy, otherwise negative.  Blood group type O positive.   EKG dated April 26, 2003, showed normal sinus rhythm, normal EKG, no  previous tracings, confirmed by Dr. Olga Millers.  Followup EKG on  May 04, 2003, sinus tachycardia, otherwise normal.  Since previous tracing, rate  is faster, confirmed by Dr. Viann Fish.   Chest x-ray dated April 26, 2003, no active findings, stable chest.  Left  hip films preoperatively showed mild progression of left hip osteoarthritic  changes.  Portable hip film and pelvis film on May 02, 2003, left total  hip arthroplasty without evidence of complicating features.  Portable chest  x-ray taken on May 04, 2003, showed no active chest disease.  CT scan of  the chest on May 04, 2003, no spiral CT evidence of acute pulmonary  embolism, a 6 to 7 mm nodule in the right lower lobe, followup uninfused CT  scan in three months is recommended.  A 13 mm low density lesion in the  liver cannot be completely characterized.  Attenuation value too high to  allow calcification as a simple cyst, may represent a cyst complicated by  ___________debris or hemorrhage of the patient if the patient has no history  of primary malignancy.  This could also be reassessed at the time of the  followup CT scan in three months.   HOSPITAL COURSE:  The patient was admitted to [facility], taken to  the OR, underwent the above stated procedure without complications.  The  patient tolerated the procedure well, later transferred to the recovery room  and then to the orthopaedic floor for continued postoperative care.  Vital  signs were followed.  The patient was given 24 hours of postoperative IV  antibiotics in the form of Ancef, placed on Coumadin, started back on  Levoxyl.  PT and OT were consulted postoperatively.  Partial weightbearing  50%.  Hemovac drain which was placed at time of surgery was pulled on  postoperative day #1.  The patient was doing fairly well on postoperative  day #1.  Had a little mild positive fluid balance.  Fluids were KVO.  By day  #2, the patient was doing very well, hopefully would progress well with  physical therapy.  She was  up ambulating approximately 60 and 80 feet by day  #2 twice.  She was noted to have some elevated rate in her pulse.  An EKG  was checked.  This showed some sinus tachycardia, blood pressure was low-  normal at 99/57.  She did not have any complaints of palpitations nor  shortness of breath or chest pain.  Due to the low pressure and the pulse  rate, she was bolused with saline.  She continued with an elevated pulse  rate and mild low-grade temperature.  Therefore, a CT scan was ordered to  rule out pulmonary embolism.  CT scan was done as above.  No pulmonary  embolism per CT scan.  Did show a right lower lobe nodule and liver lesion.  By day #3, her temperature had improved.  Her temperature came back down.  No evidence of CT pulmonary embolism, although due to the other findings of  the nodule in her lobe and also the liver lesion, a consult was called.  The  patient was seen by [facility] hospitalist, Dr. Drue Novel.  Seen and  evaluated.  It was felt with the recommendations accompanying the CT scan,  that recommend a followup CT scan in her chest and liver to be done in three  months.  With regards to the sinus tachycardia, pulse rate did stay elevated  which was felt to be multifactorial.  A TSH level was taken, appeared to be  normal, although it was on the lower-normal side.  Toprol was started.  It  is recommended that she follow up with her primary care physician within  four weeks.  By day #4, the patient was feeling much better, pulse rate had  come down.  She was weaned over to p.o. medications, and her pain was  controlled.  She was doing well with her therapy, and it was decided the  patient could be discharge home.   DISCHARGE PLAN:  The patient was discharged home on May 06, 2003.   DISCHARGE DIAGNOSES:  Please see above.   DISCHARGE MEDICATIONS:  1. Coumadin.  2. Percocet.  3. Robaxin.  4. She was also given Toprol.   FOLLOWUP: 1. Dr. Lequita Halt in two weeks.   2. Recommend to  follow up with Dr. Oliver Barre in four weeks.  She is to call both offices to set up appointment.   DISPOSITION:  Home.   CONDITION ON DISCHARGE:  Stable and improving.     Alexzandrew L. Julien Girt, P.A.              Ollen Gross, M.D.    ALP/MEDQ  D:  06/08/2003  T:  06/08/2003  Job:  [id number]   cc:   Corwin Levins, M.D. [facility]

## 2010-07-04 NOTE — Op Note (Signed)
NAMEMYANA, SCHLUP NO.:  [id number]   MEDICAL RECORD NO.:  [id number]          PATIENT TYPE:  EMS   LOCATION:  ED                           FACILITY:  [facility]   PHYSICIAN:  Erasmo Leventhal, M.D.DATE OF BIRTH:  05/09/1945   DATE OF PROCEDURE:  DATE OF DISCHARGE:                                 OPERATIVE REPORT   PREOPERATIVE DIAGNOSIS:  Dislocated right total hip arthroplasty.   POSTOPERATIVE DIAGNOSIS:  Dislocated right total hip arthroplasty.   PROCEDURE:  Closed reduction of dislocated right total hip arthroplasty,  examination under anesthesia, C-arm utilization.   SURGEON:  Erasmo Leventhal, M.D.   ASSISTANT:  Jaquelyn Bitter. Chabon, PA-C.   ANESTHESIA:  General.   ESTIMATED BLOOD LOSS:  None.   COMPLICATIONS:  None.   DISPOSITION:  To PACU stable.   OPERATIVE DETAILS:  The patient and family were counseled beforehand.  Taken  to the OR and placed in prone position.  General anesthesia.  She was  examined and found to be flexed and internally rotated.  Utilizing gentle  combination of longitudinal traction and flexion, the hip easily reduced.  Exam under anesthesia revealed that she was stable to 90 degrees of flexion  and 30 degrees of internal rotation.  The C-arm was then utilized to confirm  anatomic reduction and dislocation.  No complications or fractures.  She was  placed in a knee immobilizer.  Her pulses remained unchanged.  Neurovascular  and neurologic examination will be checked in the PACU.  The patient  tolerated the procedure without complications.  She left the OR for the PACU  in stable condition.        RAC/MEDQ  D:  07/29/2004  T:  07/29/2004  Job:  [id number]

## 2010-07-04 NOTE — Op Note (Signed)
[name], [name]               ACCOUNT NO.:  [id number]   MEDICAL RECORD NO.:  [id number]          PATIENT TYPE:  INP   LOCATION:  0004                         [facility]ACILITY:  [facility]   PHYSICIAN:  Ollen Gross. Vernell Morgans, M.D. DATE OF BIRTH:  May 20, 1945   DATE OF PROCEDURE:  12/21/2003  DATE OF DISCHARGE:                                 OPERATIVE REPORT   PREOPERATIVE DIAGNOSIS:  Umbilical hernia.   POSTOPERATIVE DIAGNOSIS:  Umbilical hernia.   PROCEDURE:  Umbilical hernia repair with mesh.   SURGEON:  Dr. Carolynne Edouard.   ANESTHESIA:  General endotracheal.   PROCEDURE:  After informed consent was obtained, the patient was brought to  the operating room and placed in a supine position on the operating table.  After adequate induction of general anesthesia, the patient's abdomen was  prepped with Betadine and draped in usual sterile manner. The area above the  umbilicus was infiltrated with 0.25%. A small incision was made with a 15  blade knife. This incision was carried down to the skin and subcutaneous  tissues sharply with the electrocautery. The edge of the hernia defect was  identified. The hernia sac was opened sharply with the electrocautery. The  contents of the hernia contained only some preperitoneal fat. This was  excised sharply with the electrocautery. The edges were cleaned up sharply  with the electrocautery. The defect was about a centimeter to a centimeter  and a half in diameter. The defect was closed with interrupted 0 Prolene  stitches. Piece of Prolene mesh was then chosen and cut into an elliptical  shape just larger than the defect. The mesh was then anchored in four  quadrants with interrupted 0 Prolene stitch. The wound was then irrigated  with copious amounts of saline. The subcutaneous fat was then closed with  interrupted 3-0 Vicryl stitches, and the skin was closed with a running 4-0  Monocryl subcuticular stitch. Benzoin, Steri-Strips, and sterile dressings  with  sterile cotton balls packed in the umbilicus were then applied. The  patient tolerated the entire procedure well. At the end of the case, all  needles, sponges, and instrument counts were correct. The patient was then  awakened and taken to recovery room in stable condition.      PST/MEDQ  D:  12/23/2003  T:  12/24/2003  Job:  [id number]

## 2010-07-04 NOTE — H&P (Signed)
NAME:  [name], [name]                         ACCOUNT NO.:  [id number]   MEDICAL RECORD NO.:  [id number]                   PATIENT TYPE:  INP   LOCATION:  0479                                 FACILITY:  [facility]   PHYSICIAN:  Ollen Gross, M.D.                 DATE OF BIRTH:  [birth year]   DATE OF ADMISSION:  05/02/2003  DATE OF DISCHARGE:  05/06/2003                                HISTORY & PHYSICAL   CHIEF COMPLAINT:  Left hip pain.   HISTORY OF PRESENT ILLNESS:  The patient is a 65 year old female well known  to Dr. Ollen Gross who has previously undergone a right total hip  replacement arthroplasty back in September 2002, is doing extremely well  with that.  She unfortunately has had continued, progressive pain in the  left hip to the point now where there is a huge difference between her right  and left side.  She is hurting all the time, interfering with her sleep.  Since she has done well with the right she would like to proceed with the  left hip replacement.  Risks and benefits discussed.  The patient is  subsequently admitted to the hospital.  Allergies  No known drug allergies.   CURRENT MEDICATIONS:  1. Evista 60 mg daily.  2. Levoxyl 0.125 mg daily.   PAST MEDICAL HISTORY:  1. History of depression.  2. Hypothyroidism.   PAST SURGICAL HISTORY:  1. Thyroidectomy.  2. She had a right wrist and hand surgery.  3. Left knee arthroscopy.  4. Right total hip replacement September 2002.   SOCIAL HISTORY:  Married, three children.  Nonsmoker, no alcohol.  Husband  and children will be assisting with her care.   FAMILY HISTORY:  Mother with a history of heart disease, diabetes, and  cancer and arthritis.  Father with a history of diabetes, arthritis, and  history of a stroke.   REVIEW OF SYSTEMS:  GENERAL:  No fevers, chills, night sweats.  NEURO:  No  seizures, syncope, paralysis.  RESPIRATORY:  She has had some nighttime  cough which she attributes to some recent  congestion and postnasal drip.  No  shortness of breath or hemoptysis.  CARDIOVASCULAR:  She has some slight  difficulty breathing while she is lying down flat, again attributed to  congestion and postnasal drip.  Otherwise, no chest pain or angina.  GI:  No  nausea, vomiting, diarrhea, or constipation.  GU:  No dysuria, hematuria, or  discharge.  MUSCULOSKELETAL:  Pertinent to the hip, found in the history of  present illness.   PHYSICAL EXAMINATION:  VITAL SIGNS:  Pulse 104, respirations 16, blood  pressure 149/92.  GENERAL:  A 65 year old African-American female, well-nourished, short  stature, slightly overweight, no acute distress.  Alert and oriented and  cooperative, very pleasant.  HEENT:  Normocephalic, atraumatic.  Pupils round and reactive.  Oropharynx  clear.  The patient is noted  to have upper dentures.  NECK:  Supple, no carotid bruits.  CHEST:  Clear anterior and posterior chest wall.  There are no rhonchi,  rales, or wheezing.  HEART:  Regular rate and rhythm, no murmurs.  ABDOMEN:  Soft, nontender, bowel sounds are present.  RECTAL, BREAST, GENITALIA:  Not done, not pertinent to present illness.  EXTREMITIES:  Significant for that of the left hip, has hip flexion of 90  degrees.  There is no internal rotation, only 20 degrees of external  rotation and 20 degrees of abduction.  She does ambulate with an antalgic  gait.   IMPRESSION:  1. Osteoarthritis, left hip.  2. Hypothyroidism.  3. History of depression.   PLAN:  The patient will be admitted to [facility] and undergo a  left total hip replacement arthroplasty.  Surgery will be performed by Dr.  Ollen Gross.     Alexzandrew L. Julien Girt, P.A.              Ollen Gross, M.D.    ALP/MEDQ  D:  05/06/2003  T:  05/08/2003  Job:  [id number]   cc:   Corwin Levins, M.D. [facility]

## 2010-07-04 NOTE — Op Note (Signed)
NAME:  [name], [name]                         ACCOUNT NO.:  [id number]   MEDICAL RECORD NO.:  [id number]                   PATIENT TYPE:  INP   LOCATION:  0479                                 FACILITY:  [facility]   PHYSICIAN:  Ollen Gross, M.D.                 DATE OF BIRTH:  1945-05-11   DATE OF PROCEDURE:  05/02/2003  DATE OF DISCHARGE:                                 OPERATIVE REPORT   PREOPERATIVE DIAGNOSES:  Osteoarthritis left hip.   POSTOPERATIVE DIAGNOSES:  Osteoarthritis left hip.   PROCEDURE:  Left total hip arthroplasty.   SURGEON:  Ollen Gross, M.D.   ASSISTANT:  Avel Peace, P.A.-C.   ANESTHESIA:  General.   ESTIMATED BLOOD LOSS:  400   DRAINS:  Hemovac x1.   COMPLICATIONS:  None.   CONDITION:  Stable to recovery.   BRIEF CLINICAL NOTE:  Ms. Privott is a 65 year old female with severe end-  stage osteoarthritis of the left hip with pain refractory to nonoperative  management. She has had a previous successful right total hip arthroplasty  and presents now for left total hip arthroplasty.   DESCRIPTION OF PROCEDURE:  After successful administration of general  anesthetic, the patient is placed in the right lateral decubitus position  with the left side up and held with the hip positioner.  The left lower  extremity is isolated from her perineum with plastic drapes and prepped and  draped in the usual sterile fashion. A standard posterolateral incision is  made with a 10 blade through the subcutaneous tissue to the level of the  fascia lata which was incised in line with the skin incision.  The sciatic  nerve was palpated and protected and short rotators isolated off the femur.  Capsulectomy is performed and the hip is dislocated. The center of the  femoral head is marked and a trial prosthesis placed such that the center of  her trial head is corresponding to the center of her native femoral head.  Osteotomy line is marked on the femoral neck and osteotomy  made with an  oscillating saw. The head and neck are removed and femur retracted  anteriorly to gain acetabular exposure.   The acetabular retractors are placed and osteophytes and labrum removed.  Reaming starts at a 47 coursing in increments of 2 to a 51 and a 52 mm  pinnacle acetabular shell is placed in anatomic position and transfixed with  two dome screws.  She has a slight dysplastic acetabulum thus there was some  overhand posterolaterally as expected.  The trial 32 mm neutral liner is  then placed.   Femoral preparation is initiated with the canal finder and then irrigation.  Axial reaming is performed up to 13.5 mm, proximal reaming to an 18D and the  sleeve machined to a small. An 18D small sleeve is trialed, sleeve is placed  through an 18 x 13 stem and a 36  plus 8 neck. We went 10 degrees beyond her  native anteversion to give approximately 20 degrees of version. A 32 plus 0  trial head is placed and the hip is reduced with outstanding stability, full  extension and full external rotation, 70 degrees flexion, 40 degrees  adduction and 90 degrees internal rotation and 90 degrees flexion, 70  degrees internal rotation.  The trials were all removed and then the  permanent apex hole eliminator is placed into the acetabular shell. The  permanent 32 mm neutral marathon liner is placed. The 18D small trial sleeve  is then placed and the 18 x 13 stem with the 36 plus 8 neck placed once  again 10 degrees beyond her native anteversion.  The 32 plus 0 head is  placed and the hip is reduced to the same stability parameters.  The wound  is copiously irrigated with antibiotic solution and short rotators  reattached to the femur through drill holes.  The fascia lata is closed over  a Hemovac drain with interrupted #1 Vicryl, subcu closed in three layers  with #1 and then #1 and then 2-0 Vicryl and subcuticular with running 4-0  Monocryl.  20 mL of 0.25% Marcaine with epinephrine injected  into the  subcutaneous tissues around the incision. A bulky sterile dressing is  applied and drains hooked to suction. She is then placed into a knee  immobilizer, awakened and transported to recovery in stable condition.                                               Ollen Gross, M.D.    FA/MEDQ  D:  05/02/2003  T:  05/03/2003  Job:  [id number]

## 2010-07-04 NOTE — H&P (Signed)
[facility]  Patient:    [name], [name] Visit Number: [id number] MRN: [id number]          Service Type: Attending:  Ollen Gross, M.D. Dictated by:   Ottie Glazier. Wynona Neat, P.A.C. Adm. Date:  11/08/00                           History and Physical  DATE OF BIRTH:  12-29-1945  CHIEF COMPLAINT:  Bilateral hip pain.  HISTORY OF PRESENT ILLNESS:  [name] is a very pleasant 65 year old white female with a history of bilateral hip pain for several years.  She states the pain in her right hip is greater than the pain in her left hip.  She states the pain is mainly in the groin area where it radiates down into the middle thigh.  It has now gotten to the point where is "hurts her every day now." She is having difficulty with her daily activities and functional activities, i.e., she is having trouble tying her shoes, getting up and down stairs, getting into and out of a car.  She also states that recently she has been having a lot of pain at night in the right hip.  After a lengthy discussion with Dr. Lequita Halt, the patient came to the decision where she wanted to proceed with definitive surgical treatment.  ALLERGIES:  None.  MEDICATIONS: 1. Evista 60 mg one p.o. q.d. 2. Levoxyl 0.1 mg one p.o. q.d. 3. Aleve over the counter.  PAST MEDICAL HISTORY: 1. Significant for hypothyroidism. 2. End-stage osteoarthritis, left hip.  PAST SURGICAL HISTORY:  She has had a thyroidectomy.  She has had a right LRTI.  She has also had a left knee arthroscopy.  SOCIAL HISTORY:  She is married with two children.  She is a nonsmoker, denies any alcohol use.  She lives in a single level home.  FAMILY HISTORY:  She had a mother, sister, and two nieces with diabetes mellitus, both type 1 and 2.  Her family doctor is Dr. Jonny Ruiz, who has granted medical clearance for this procedure.  REVIEW OF SYSTEMS:  In general the patient denies any recent night sweats, fever, chills,  decrease in appetite, or decrease in weight.  HEENT:  No history of chronic headaches, ringing of the ears, persistent runny nose, or sore throat.  CHEST:  No history of chronic cough, productive cough, or hemoptysis.  CARDIOVASCULAR:  No history of chest pains or irregular heartbeats.  GASTROINTESTINAL/GENITOURINARY:  No history of chronic diarrhea, constipation, melena, bright red stools per rectum.  No frequency, urgency, nocturia, dysuria.  EXTREMITIES:  Please see HPI.  NEUROLOGIC:  She denies any history of seizures, stroke, paralysis, paresthesias, numbness, or tingling, or weakness.  PHYSICAL EXAMINATION:  VITAL SIGNS:  Blood pressure is 130/90, respirations 18, pulse is 100.  GENERAL:  In general this is a very pleasant 65 year old black female in no acute distress, very talkative, alert and oriented x 3.  HEENT:  Head is atraumatic, normocephalic.  NECK:  Supple.  No bruits noted.  CHEST:  Clear to auscultation bilaterally.  BREASTS:  Examination deferred.  HEART:  S1, S2, slightly tachycardic.  ABDOMEN:  Soft, nontender, bowel sounds positive.  No guarding, no rebound.  EXTREMITIES:  Right hip placement of approximately 90, internal rotation none, external rotation of about 10, abduction was 20.  She has hip pain on any range of motion.  She walks with an antalgic gait with assistance from  a cane as well.  LABORATORY/X-RAYS:  X-rays reveal severe bone on bone changes with slight subluxation on the right.  IMPRESSION:  End-stage osteoarthritis of the right hip.  PLAN:  The patient will be admitted to [facility] to undergo a right total hip replacement per Dr. Lequita Halt on November 08, 2000 at 9:15.  All questions have been encouraged and answered appropriately with Ms. Temme, to which she stated a good understanding prior to entering the operating suite. Dictated by:   Ottie Glazier. Wynona Neat, P.A.C. Attending:  Ollen Gross, M.D. DD:  11/01/00 TD:  11/01/00 Job:  [id number]

## 2010-07-04 NOTE — Op Note (Signed)
[name], [name]               ACCOUNT NO.:  [id number]   MEDICAL RECORD NO.:  [id number]          PATIENT TYPE:  OBV   LOCATION:  9305                          FACILITY:  [facility]   PHYSICIAN:  Randye Lobo, M.D.   DATE OF BIRTH:  1945-12-04   DATE OF PROCEDURE:  04/07/2005  DATE OF DISCHARGE:                                 OPERATIVE REPORT   PREOPERATIVE DIAGNOSES:  1.  Genuine stress incontinence.  2.  Complete uterovaginal prolapse.   POSTOPERATIVE DIAGNOSES:  1.  Genuine stress incontinence.  2.  Complete uterovaginal prolapse.   PROCEDURE:  Tension-free vaginal tape, cystoscopy, McCall culdoplasty,  anterior and posterior colporrhaphy.   SURGEON:  [name], M.D.   ASSISTANT:  [name], M.D.   ANESTHESIA:  General endotracheal, local with 0.5% lidocaine with 1:200,000  of epinephrine.   IV FLUIDS:  1500 mL Ringer's lactate.   ESTIMATED BLOOD LOSS:  100 mL.   URINE OUTPUT:  200 mL.   COMPLICATIONS:  None.   INDICATIONS FOR PROCEDURE:  The patient is a 65 year old gravida 3, para 3-0-  0-2 African-American female who presented with vaginal pressure to her  primary gynecologist, Dr. Bobbie Stark, who diagnosed the patient with  complete uterovaginal prolapse.  The patient did have a trial of a pessary  use which was not successful. The patient wished for surgical treatment of  her prolapse.  She did undergo urodynamic testing with reduction of the  prolapse and this  did document genuine stress incontinence.   A plan is made now to proceed with a total vaginal hysterectomy with  possible bilateral salpingo-oophorectomy by Dr. Cherly Stark, and then I will  proceed with a McCall culdoplasty, tension-free vaginal tape, cystoscopy,  and the anterior and posterior colporrhaphy.  The risks, benefits, and  alternatives have been discussed with the patient who wishes to proceed.   FINDINGS:  The patient was examined under anesthesia and the complete  procidentia was appreciated. The patient was noted to have a very large  cystocele.  There was a small rectocele and a probable enterocele  appreciated with exam under anesthesia.   Cystoscopy demonstrated no foreign body in the bladder or urethra with  placement of the sling needle passers . The ureters were noted to be patent  bilaterally.  Once the posterior vaginal wall was opened, there was  appreciation of the small enterocele.   SPECIMEN:  None.   DESCRIPTION OF PROCEDURE:  The patient was reidentified in the preoperative  hold area.  She did receive Ancef then and gentamicin for antibiotic  prophylaxis due to a fairly recent hip replacement.  The patient received  both PAS stockings and TED hose for DVT prophylaxis.   The patient was brought to the operating room where general endotracheal  anesthesia was induced.  She was placed in the dorsal lithotomy position and  the vagina and perineum and mons pubis were then sterilely prepped and  draped.  A Foley catheter was sterilely placed inside the bladder.  The  patient was examined under anesthesia and the findings are as noted above.  Dr. Cherly Stark proceeded with the with the total vaginal hysterectomy with a  right salpingo-oophorectomy.  Please refer to this dictation separately.  At  termination of this procedure. hemostasis was good.  Dr. Cherly Stark had placed  a running locked suture of the posterior vaginal cuff and the peritoneal  cavity was opened at this time.   I then began my portion of the procedure.  I placed a small weighted  speculum in the vagina.  A McCall culdoplasty suture was performed with a 0  Vicryl suture.  This was brought through the vagina into the posterior cul-  de-sac at the 6 o'clock position, through the patient's left uterosacral  ligament across the posterior cul-de-sac in a pursestring shape and then  down through the right uterosacral ligament before coming out the vagina at  the 6 o'clock  position.  The suture was held until the end of the case at  which time it was tied to provide excellent vaginal cuff elevation.   The anterior colporrhaphy was performed next.  Allis clamps were used to  mark the midline of the vaginal mucosa all the way to 1 cm below the  urethral meatus.  The vaginal mucosa was injected with 0.5% lidocaine with  1:200,000 of epinephrine.  The vaginal mucosa was then incised vertically  with Metzenbaum scissors.  The subvaginal tissue was dissected off of the  overlying bladder bilaterally using a combination of sharp and blunt  dissection.  The dissection was carried back to the level of the pubic rami  bilaterally without entering into the retropubic spaces.  Hemostasis was  created with both monopolar cautery and with a series of two figure-of-eight  sutures of 2-0 Vicryl to the right of the proximal urethra where there was a  bleeding vein.   The TVT sling was performed next.  Two 1 cm incisions were created 2 cm to  the right and to the left of the midline of the pubic symphysis.  The TVT  was performed in a top-down fashion.  The right abdominal needle passer was  placed through the right suprapubic incision and out through the vagina at  the level of the mid urethra and lateral to this.  The abdominal needle  passer was similarly placed on the patient's left-hand side. The Foley  catheter was removed at this time, and cystoscopy was performed in the  findings were as noted above.  The cystoscopy fluid was then drained from  the bladder, and the Foley catheter was placed and the TVT was attached to  the abdominal needle passers and drawn up through the suprapubic incisions  bilaterally.  The plastic sheaths were separated from the surrounding sling,  and the sling was drawn gently upward while protecting the tension from  below by placing a Kelly clamp between the urethra and the sling itself. Excess sling material was then trimmed suprapubically.   The anterior  colporrhaphy was performed by placing a series of vertical mattress sutures  of 2-0 Vicryl which reduced the cystocele well.  Excess vaginal mucosa was  then trimmed anteriorly, and the anterior vaginal wall was closed with a  running locked suture of 2-0 Vicryl down to the level of the vaginal cuff.  A separate suture of 2-0 Vicryl was similarly placed in a running locked  fashion to close the vaginal cuff at this time.   The posterior colporrhaphy was performed last.  Again Allis clamps were used  to mark the midline of the posterior vaginal  wall up to the level of the  [facility] and just below this.  The posterior vaginal mucosa and  the perineum were then injected with 0.5% lidocaine with 1:200,000 of  epinephrine.  A triangular wedge of epithelium was removed from the perineal  body, and the posterior vaginal wall was then incised vertically using Mayo  scissors.  The perirectal fascia was dissected off of the overlying vaginal  mucosa bilaterally.  At the top of the rectocele a small enterocele was  encountered.  Rectal examination confirmed the presence of what was really a  high rectocele and a small enterocele.  The enterocele sac was not opened,  and a pursestring suture of 2-0 Vicryl was placed for reduction of the  enterocele.  The remaining posterior colporrhaphy was performed with a  series of vertical mattress sutures, first of 2-0 Vicryl which then  transitioned to 0 Vicryl down at the distal vagina and along the perineal  body.  Excess posterior vaginal mucosa was then trimmed, and the posterior  vagina was closed with a running locked suture of 2-0 Vicryl which was  continued along the perineal body in a subcuticular fashion. The knot was  tied at the hymen.   The culdoplasty suture was tied at this time and provided excellent  elevation of the vaginal cuff.  The Foley catheter was left to gravity  drainage. The suprapubic incisions were closed  with Dermabond.  A vaginal  packing with Estrace cream was placed inside the vagina.  Final rectal exam  confirmed the absence of sutures in the rectum.   The patient was cleansed with Betadine and taken out of the dorsal lithotomy  position. She was awakened and escorted to the recovery room in stable and  awake condition. There were no complications to the procedure.  All needle,  instrument and sponge counts were correct.      Randye Lobo, M.D.  Electronically Signed     BES/MEDQ  D:  04/07/2005  T:  04/07/2005  Job:  [id number]   cc:   [name], M.D.  Fax: [phone]

## 2010-07-04 NOTE — Consult Note (Signed)
NAMELULIE, HURD NO.:  [id number]   MEDICAL RECORD NO.:  [id number]          PATIENT TYPE:  EMS   LOCATION:  ED                           [facility]ACILITY:  [facility]   PHYSICIAN:  Erasmo Leventhal, M.D.DATE OF BIRTH:  08/09/1945   DATE OF CONSULTATION:  DATE OF DISCHARGE:                                   CONSULTATION   HISTORY OF PRESENT ILLNESS:  Ms. Pieratt is a 65 year old female patient of  Dr. Despina Hick and is status post bilateral total hip replacements in the past.  A couple of weeks, she felt like the right hip might have slipped and went  back into place.  Today, on June 13, she was getting up from a chair and  felt like the right hip slipped out of place.  She was brought to [facility] emergency room by EMS.  Here, she was evaluated by the emergency room  staff and found to have a dislocated right total hip arthroplasty.  I was  consulted.   ALLERGIES:  None.   MEDICATIONS:  Lisinopril, aspirin, Levoxyl, and a statin drug for  cholesterol.   PAST MEDICAL HISTORY:  1.  Hypothyroidism.  2.  Dyslipidemia.   PAST SURGICAL HISTORY:  Total hips, as above.   FAMILY HISTORY:  Negative.   SOCIAL HISTORY:  Lives with husband.  No alcohol or tobacco.   REVIEW OF SYSTEMS:  Negative.   PHYSICAL EXAMINATION:  GENERAL:  An awake and alert, very pleasant lady.  Accompanied by her family.  EXTREMITIES:  The right hip is flexed and slightly internally rotated.  Distal pulses are 4+.  Capillary refill is normal.  Dorsiflexion and plantar  flexion of foot well.  The upper extremities and left lower extremity are  unremarkable.   X-rays show a superior dislocation of a right total hip arthroplasty.   IMPRESSION:  Status post right total hip arthroplasty with acute  dislocation.   RECOMMENDATIONS:  I had a discussion with the patient and her family members  that are with her.  We discussed treatment options.  We have opted for the  operating room with general  anesthetic and closed reduction.  She  understands the possibility of fracture, inability to get reduced closed,  neurovascular damage, etc.  With that in  mind, we will proceed to the OR for closed reduction under general  anesthesia.  If, in fact, her hip was found to be stable, we will put her  through an immobilizer.  She would like to go home this evening.  She will  follow up in our office at the end of the week.  All questions are  encouraged and answered for the patient and her family.        RAC/MEDQ  D:  07/29/2004  T:  07/29/2004  Job:  [id number]

## 2010-07-04 NOTE — Consult Note (Signed)
NAME:  [name], [name]                         ACCOUNT NO.:  [id number]   MEDICAL RECORD NO.:  [id number]                   PATIENT TYPE:  INP   LOCATION:  0479                                 FACILITY:  [facility]   PHYSICIAN:  Wanda Plump, MD [facility]                 DATE OF BIRTH:  August 08, 1945   DATE OF CONSULTATION:  DATE OF DISCHARGE:  05/06/2003                                   CONSULTATION   INTERNAL MEDICINE CONSULTATION:   REASON FOR CONSULTATION:  Abnormal CT of the chest and liver.   Mrs. [name] is a 65 year old black female admitted to the hospital 3  days ago for a left total hip arthroplasty.  She was doing very well until  yesterday when she was noticed to be slightly febrile and tachycardic, this  prompted a chest x-ray which was reportedly negative and a CT of the chest  that was negative for pulmonary embolism but shows a 6-7 mm right lower lobe  lung nodule as well as a 13 mm poorly characterized low density in the liver  - questionable cyst or hemorrhage.  Clinically the patient is doing better  today.   PAST MEDICAL HISTORY:  1. Severe osteoarthritis.  2. Status post thyroidectomy years ago.  3. Status post right total hip replacement a few years ago.   FAMILY HISTORY:  Positive for diabetes.   SOCIAL HISTORY:  Negative for tobacco or alcohol.   MEDICATIONS:  As an outpatient Mrs. [name] takes only Evista and Synthroid.   REVIEW OF SYSTEMS:  Prior to admission to the hospital, besides the  arthritic pain, the patient was doing well.  She denies any fever, weight  loss, nausea or diarrhea.  She did have seldom episodes of vomiting but that  has not triggered in the past any workup.  She denies any abdominal pain.  She does have occasional cough which is mostly nocturnal.   ALLERGIES:  No known drug allergies.   PHYSICAL EXAMINATION:  GENERAL:  The patient is alert, oriented, she is no  apparent distress.  She is resting at her chair with her legs  elevated.  VITAL SIGNS:  Temperature today is 98.4 with a pulse of 96, respirations 18,  blood pressure 129/63.  NECK:  No JVD.  LUNGS:  Basically clear to auscultation bilaterally.  CARDIOVASCULAR:  Regular rate and rhythm without a murmur.  ABDOMEN:  Not distended, soft, and has good bowel sounds.  It is not tender  to palpation.  EXTREMITIES:  No peripheral edema.   LABORATORIES AND X-RAYS:  Baseline labs prior to surgery show a white count  of 4.1 with a hemoglobin of 12.5, platelets of 288, potassium was 3.6,  glucose 95, BUN and creatinine were 11 and 0.7, respectively.  LFTs, PT and  PTT were within normal range.   ASSESSMENT AND PLAN:  Mrs. [name] is a 65 year old lady who is status post  a  left total hip arthroplasty 3 days ago with an abnormal CT of the chest and  liver, I believe this can be followed up as an outpatient, my recommendation  is for her to see her primary care doctor in 3 or 4 weeks after the surgery,  at that time Dr. Jonny Ruiz will decide whether or not she needs a liver  ultrasound to further characterize the liver lesion.  She will also need CT  of the lungs and liver in 3 months to reassess the findings.  Most likely  this will be a benign condition and I told so to the patient, at the same  time I emphasized the need to follow up on this problem, she seems to  understand.  The patient is doing well at present and I do not think we need  to follow her while she is in the hospital however, I recommend the  orthopedic team to call us any time should any question or problem arise.                                               Wanda Plump, MD [facility]    JEP/MEDQ  D:  05/05/2003  T:  05/06/2003  Job:  [id number]   cc:   Ollen Gross, M.D.  [facility]  [address]  [address]  [place]  [place] [address]  Fax: [phone]   Corwin Levins, M.D. [facility]

## 2010-07-04 NOTE — Discharge Summary (Signed)
[facility]  Patient:    AMEENA, VESEY Visit Number: [id number] MRN: [id number]          Service Type: SUR Location: 4W [id number] 02 Attending Physician:  Loanne Drilling Dictated by:   Marcie Bal Troncale, P.A.-C. Admit Date:  11/08/2000 Disc. Date: 11/12/00   CC:         Corwin Levins, M.D. [facility]   Discharge Summary  PRIMARY DIAGNOSES: 1. End-stage osteoarthritis, left hip. 2. Postoperative mild hyponatremia.  SECONDARY DIAGNOSIS:  Hypothyroidism.  SURGICAL PROCEDURE:  Right total hip arthroplasty by Dr. Ollen Gross with the assistance of Dr. Beverely Low on November 08, 2000.  Please see operative summary for further details.  CONSULTATIONS:  None.  LABORATORY DATA:  Preoperative H&H were 12.9 and 37.5 respectively. Postoperatively, she was relatively stable, initially 9.9 and 29.0 respectively and, then, prior to discharge, 9.5 and 27.6 respectively.  Her PT and INR preoperatively were 13.3 and 1.0 respectively.  Prior to discharge, improved to 20 and 2.0 INR.  Chemistry 7 preoperatively was all within normal limits with the exception of calcium was 11.2, slightly elevated. Postoperatively, also stable with just slightly elevated glucoses.  Urinalysis preoperatively showed negative nitrite, just a trace of leukocyte esterase, but it was a contaminated specimen with many epithelial cells and 3 to 6 white blood cells.  A repeat on the 16th was nitrite and leukocyte esterase negative with less than 3 white blood cells.  Chest x-ray preoperatively was negative for active disease.  EKG from August 31, 2000, was read as normal with no acute ST changes.  CHIEF COMPLAINT:  Bilateral hip pain.  HISTORY OF PRESENT ILLNESS:  Odesser Ammirati is a 65 year old female who has had a history of bilateral hip pain for quite a few years.  She notes that the right hip has been worse than the left hip.  She has pain mostly on a daily basis and now  affecting her activities of daily living and affecting her functional activities.  She has tried conservative measures including various medications without improvement.  Because of significant findings on her clinical and radiographic exam and the effect this is having on her ADLs, it is recommended she may benefit from surgical intervention.  Preoperative labs and signed surgical consent were obtained and she was admitted on September 23 for surgical intervention.  HOSPITAL COURSE:  Following the surgical procedure, patient was taken to the PACU in stable condition and transferred to the orthopedic floor in stable condition.  She did well during her postoperative stay.  Her hemoglobin remained stable and she did not require any blood transfusions.  She did have some mild hyponatremia postoperatively and this was corrected with dosing of Lasix and fluid restriction.  Wound was examined on postoperative day #2 and subsequently found to be clean, dry, and intact without signs or symptoms of infection.  She was on Coumadin for DVT prophylaxis and INRs were therapeutic prior to discharge.  She received routine postoperative total hip protocol including pain medications as well as postoperative antibiotics.  By postoperative day #4, she was medically and orthopedically stable and ready for discharge home as she had been progressing well with physical therapy. She was ambulating upwards of 145 feet prior to discharge.  DISPOSITION:  Patient will be discharged home.  She is to follow up with Dr. Lequita Halt in two weeks.  She is to call [phone] for an appointment.  She will be discharged home with [facility] for home health  physical therapy and Coumadin management.  ACTIVITIES:  She is to remain touchdown weightbearing on the right lower extremity with the assistance of a walker.  Continue to follow total hip precautions.  She may begin showering at home.   A knee immobilizer  when sleeping only.  She is to take it off for ambulation and gait.  CONDITION ON DISCHARGE:  Stable.  DISCHARGE MEDICATIONS: 1. Percocet 5/325 one to two p.o. q.4-6h. p.r.n. pain. 2. Robaxin 500 mg one p.o. q.8h. p.r.n. spasm. 3. Coumadin per pharmacy dosing for a total of three weeks postoperatively. 4. Synthroid 100 mcg p.o. q.d. 5. Trinsicon one p.o. t.i.d. p.c. Dictated by:   Marcie Bal Troncale, P.A.-C. Attending Physician:  Loanne Drilling DD:  11/12/00 TD:  11/12/00 Job: [id number]

## 2010-07-04 NOTE — Discharge Summary (Signed)
[name], [name]               ACCOUNT NO.:  [id number]   MEDICAL RECORD NO.:  [id number]          PATIENT TYPE:  INP   LOCATION:  9305                          FACILITY:  [facility]   PHYSICIAN:  Maxie Better, M.D.DATE OF BIRTH:  07-01-1945   DATE OF ADMISSION:  04/07/2005  DATE OF DISCHARGE:  04/09/2005                                 DISCHARGE SUMMARY   ADMISSION DIAGNOSES:  1.  Procidentia.  2.  Genuine stress incontinence.  3.  Rectocele  4.  Cystocele   DISCHARGE DIAGNOSES:  1.  Genuine stress incontinence.  2.  Procidentia.  3.  Rectocele  4.  Enterocele  5.  Cystocele   PROCEDURE:  A total vaginal hysterectomy and right salpingo-oophorectomy,  TVT, cystoscopy, McCall culdoplasty, anterior and posterior colporrhaphy.   HOSPITAL COURSE:  The patient was admitted to the [facility].  She was  taken to the operating room where she underwent the above described  procedures in combination between Dr.  Cherly Hensen and Dr. Edward Jolly.  The findings  at the time of surgery were a complete procidentia, small enterocele,  tainted ureters by cystoscopy, the left tube and ovary was normal, but fixed  to the left pelvic side wall and therefore, was not amendable for removal.  The uterus and cervix was otherwise normal.  The specimen sent to pathology  showed a uterus that was 141 g, normal right tube and ovary, a cervix that  was hyperkeratotic due to the prolapse, a benign endometrial polyp,  proliferative phase endometrium and several fibroids.  The patient had an  essentially unremarkable postoperative course.  She had a temperature  maximum of 100.2 with subsequently spontaneously defervesced with pulmonary  toiletry.  She had increased post void residual, which resulted in the  decision to place an indwelling Foley catheter with a leg bag before  discharge.  Her CBC on postop day #1 showed a hemoglobin of 10.1, hematocrit  of 29.9, white count of 8.1 and platelet count  221,000.  Her basic metabolic  profile was unremarkable.  On postop day #2, she had a bowel movement, pain  was well controlled, she had no evidence of any infection, she had voided  250 mL of urine, but the post void residual was 150.  Based on these  findings, the patient was discharged home with an indwelling Foley catheter.  She was, otherwise, deemed well to be discharged home and she was feeling  well.   DISPOSITION:  Home.   CONDITION:  Stable.   DISCHARGE INSTRUCTIONS:  Followup with Dr. Edward Jolly in 4 days and Dr. Cherly Hensen  in 4-6 weeks.  Call for temperature greater than or equal to 100.4.  Nothing  per vagina for 4-6 weeks.  No heavy lifting or driving.  Do not strain with  any bowel movements.  Call for severe abdominal pain, nausea or vomiting,  soaking a regular pad every hour or more frequently and bladder spasms.   DISCHARGE MEDICATIONS:  1.  Tylox #30, 1 p.o. q.4-6h. p.r.n. pain.  2.  Esterase cream 1 g per vagina daily.  3.  Macrobid 100 mg  p.o. q.h.s. until seen by Dr. Edward Jolly.      Maxie Better, M.D.  Electronically Signed     [place]/MEDQ  D:  04/26/2005  T:  04/27/2005  Job:  [id number]

## 2010-07-04 NOTE — H&P (Signed)
[name], [name]               ACCOUNT NO.:  [id number]   MEDICAL RECORD NO.:  [id number]         PATIENT TYPE:  WOBV   LOCATION:                                FACILITY:  [facility]   PHYSICIAN:  Randye Lobo, M.D.   DATE OF BIRTH:  1945/03/27   DATE OF ADMISSION:  02/24/2005  DATE OF DISCHARGE:  02/24/2005                                HISTORY & PHYSICAL   CHIEF COMPLAINT:  Vaginal pressure.   HISTORY OF PRESENT ILLNESS:  The patient is a 65 year old gravida 3, para 3-  0-0-2 African-American female, who was referred by Dr. Maxie Better  for evaluation and possible treatment of complete uterine procidentia.  The  patient had failed the use of a pessary and was interested in surgical  treatment.  Due to the patient's extensive uterovaginal prolapse, she did  undergo multichannel urodynamic testing with reduction of the prolapse, and  this did document genuine stress incontinence.  The patient also had voiding  dysfunction on her uroflow study with a large postvoid residual of 115 cc  after a void of 80 cc.   PAST OBSTETRIC AND GYNECOLOGIC HISTORY:  The patient has had three term  pregnancies.  One of these pregnancies was a set of twins which died after  birth.  The patient's last Pap smear was performed in May of 2006 and was  within normal limits.  Her last mammogram was performed in February 2006 and  was within normal limits.  The patient is not taking any hormone replacement  therapy.   PAST MEDICAL HISTORY:  1.  Hypothyroidism.  The patient is status post partial thyroidectomy due to      a goiter.  She is currently maintained on Levoxyl.  2.  Hyperlipidemia.  3.  Hypertension.  The patient has not suffered from a stroke nor a      myocardial infarction.  4.  Obesity.  5.  Varicose veins of the lower extremities.   PAST SURGICAL HISTORY:  1.  Status post partial thyroidectomy.  2.  Status post umbilical herniorrhaphy with mesh placement in November of       2005.  3.  Status post right total hip replacement in 2002.  4.  Status post left total hip replacement in 2005.   MEDICATIONS:  1.  Lisinopril 10 mg p.o. daily.  2.  Lovastatin 20 mg p.o. daily.  3.  Levoxyl 100 mcg p.o. daily.  4.  Multivitamin.  5.  Metamucil p.r.n.   ALLERGIES:  No known drug allergies.   SOCIAL HISTORY:  The patient has been married for 32 years.  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  The patient  denies the use of tobacco, alcohol, or illicit drugs.   FAMILY HISTORY:  Positive for heart disease in the patient's parents.  Positive for stroke in the patient's father.  Positive for diabetes mellitus  in the patient's mother.   PHYSICAL EXAMINATION:  VITAL SIGNS:  Height 5 feet 2 inches, weight 204  pounds.  Blood pressure 120/60.  HEENT:  Normocephalic, atraumatic.  LUNGS:  Clear to auscultation bilaterally.  HEART:  S1/S2 with a regular rate and rhythm.  ABDOMEN:  Soft and nontender and without evidence of hepatosplenomegaly or  organomegaly.  The patient has central obesity.  PELVIC:  Normal urethra and external genitalia.  There is complete  procidentia of the uterus.  The vagina demonstrates no ulcerations.  The  patient has a large cystocele.  There is a small rectocele present.  Rectovaginal examination confirms the bimanual examination.   IMPRESSION:  The patient is a 65 year old female with complete uterovaginal  prolapse and urodynamically proven genuine stress incontinence.  The patient  is status post recent complete hip replacement.   PLAN:  The patient will be admitted to the [facility] on  April 07, 2005, at which time she will undergo a total vaginal  hysterectomy with bilateral salpingo-oophorectomy by Dr. Maxie Better.  I will perform a tension free vaginal tape with cystoscopy and assist Dr.  Cherly Hensen with a culdoplasty of the vaginal cuff and an anterior and posterior   colporrhaphy.  The patient will be treated with ampicillin and gentamicin IV  preoperatively.  She will receive TED hose and PAS stockings for DVT  prophylaxis.  Risks, benefits, and alternatives have been discussed with the  patient who wishes to proceed.      Randye Lobo, M.D.  Electronically Signed     BES/MEDQ  D:  04/06/2005  T:  04/06/2005  Job:  [id number]   cc:   Maxie Better, M.D.  Fax: [phone]

## 2010-07-04 NOTE — Op Note (Signed)
[facility]  Patient:    [name], [name] Visit Number: [id number] MRN: [id number]          Service Type: SUR Location: 1S 0008 01 Attending Physician:  Loanne Drilling Dictated by:   Ollen Gross, M.D. Proc. Date: 11/08/00 Admit Date:  11/08/2000                             Operative Report  PREOPERATIVE DIAGNOSIS:  Osteoarthritis, right hip.  POSTOPERATIVE DIAGNOSIS:  Osteoarthritis, right hip.  PROCEDURE:  Right total hip arthroplasty.  SURGEON:  Ollen Gross, M.D.  ASSISTANTS:  Dr. Beverely Low.  ANESTHESIA:  General.  ESTIMATED BLOOD LOSS:  600.  DRAINS:  Hemovac x 1.  COMPLICATIONS:  None.  DISPOSITION:  To recovery.  BRIEF CLINICAL NOTE:  Ms. Carriger is a 65 year old female with severe osteoarthritis involving both hips.  Pain is currently worse in the right.  It has been refractory to nonoperative management on both sides.  She presents now for right total hip arthroplasty.  DESCRIPTION OF PROCEDURE:  After successful administration of general anesthetic, patient placed in the left lateral decubitus position with the right side up and held with the hip positioner.  The right lower extremity was isolated from her perineum with a plastic drape and prepped and draped in the usual sterile fashion.  Standard posterolateral incision was made, being cut with a 10 blade through subcutaneous tissue to the level of the fascia lata, which was incised in line with the skin incision.  Short external rotators were isolated off the femur and the sciatic nerve palpated and protected. Capsulectomy was then performed.  Hip was dislocated, center of the femoral head and marked.  A trial was placed, 60 cm.  The trial head corresponds to the center of our native femoral head.  Osteotomy lines then marked on the femoral neck and an osteotomy made with an oscillating saw.  The femur was retracted anteriorly and the anterior capsule removed  and acetabular exposure obtained.  Acetabular reaming starts at 47, coursing in increments of 2 to a 51, and then a 52 mm Pinnacle acetabular shell is impacted into the acetabulum, matching her native anteversion.  It is transfixed with two domed screws.  Trial 28 mm 10 degree liner is placed.  The femur is then addressed with canal finder and then the canal irrigated. Axial reaming is performed starting at 9 mm and coursing in increments of 0.5 up to 13.5 mm.  Proximal reaming is then performed to an 18D, and the sleeve is machined to a large.  An 18D trial large sleeve is placed with an 18 x 13 stem and a 36 +8 neck.  The anteversion is rotated to match her native anteversion, and a 28 +0 head is placed.  The hip is reduced with outstanding stability, full extension, full external rotation, 70 degrees flexion 40 degrees adduction, 70 degrees internal rotation and 90 degrees of flexion at 70 degrees internal rotation.  At this time the trial is removed.  The Apex hole eliminator is placed into the acetabular shell, and the permanent 28 mm +4 10 degree Marathon liner is impacted into the shell.  The 18D large sleeve is placed in the proximal femur, and the 18 x 13 stem with 36 +8 neck is impacted, matching her native anteversion.  A permanent 28 +0 head is placed, hip reduced, once again with excellent stability.  The wound was copiously  irrigated with antibiotic solution, the short external rotators reattached to the femur through drill holes.  The fascia lata is closed over a Hemovac drain with interrupted #1 Vicryl.  The thick subcu layer is closed with multiple layers with #1, 0, and 2-0 Vicryl and the subcuticular closed with a running 4-0 Monocryl.  The incision is cleaned and dried, and Steri-Strips and bulky sterile dressings applied.  The drain is hooked to suction.  She is placed in a knee immobilizer, awakened, and transported to recovery in stable condition. Dictated by:    Ollen Gross, M.D. Attending Physician:  Loanne Drilling DD:  11/08/00 TD:  11/08/00 Job: [id number]

## 2010-07-04 NOTE — Op Note (Signed)
NAMESHADOW, STIGGERS               ACCOUNT NO.:  [id number]   MEDICAL RECORD NO.:  [id number]          PATIENT TYPE:  EMS   LOCATION:  ED                           FACILITY:  [facility]   PHYSICIAN:  Vania Rea. Supple, M.D.  DATE OF BIRTH:  Oct 19, 1945   DATE OF PROCEDURE:  01/29/2006  DATE OF DISCHARGE:                               OPERATIVE REPORT   PREOPERATIVE DIAGNOSIS:  Dislocated right total hip arthroplasty.   POSTOPERATIVE DIAGNOSIS:  Dislocated right total hip arthroplasty.   PROCEDURE:  Closed reduction of dislocated right total hip arthroplasty.   SURGEON:  Vania Rea. Supple, M.D.   ANESTHESIA:  IV sedation.   HISTORY:  Ms. Nanney is a 65 year old female status post bilateral hip  replacements by Dr. Lequita Halt and had, unfortunately, a right hip  dislocation back in June and underwent a successful closed reduction.  The patient was doing well until today at home when she was getting up  out of a chair and had immediate complaints of right hip pain and  inability to bear weight.  During her evaluation in the emergency room,  she was found to have a foreshortened and internally rotated right lower  extremity.  Radiographs confirmed a superior dislocation of the right  total hip implant.  She is subsequently treated for closed reduction.   We counseled Ms. Anastos on treatment options as well as risks versus  benefits thereof.  The possible complications include recurrent  dislocation and possible risks of surgery are reviewed.  She  understands, accepts and agrees to planned procedure.   PROCEDURE IN DETAIL:  After undergoing routine monitoring, the patient  received IV sedation.  After appropriate relaxation, a reduction  maneuver was performed with gentle flexion, internal rotation, and  longitudinal traction.  Palpable and audible reduction was observed.  Fluoroscopic images were then obtained and confirmed that the right hip  was concentrically reduced and had a stable arc  of motion.  The right  lower extremity was then placed in a knee immobilizer.  The patient was  then awakened and transferred back to the emergency room for planned  discharge home.   POSTOP PLAN:  Follow-up with Dr. Lequita Halt in 2-3 weeks.  Use the knee  immobilizer when she is in bed and in the chair.  Otherwise, she may be  weight-bearing as tolerated.      Vania Rea. Supple, M.D.  Electronically Signed     KMS/MEDQ  D:  01/29/2006  T:  01/30/2006  Job:  [id number]

## 2010-07-04 NOTE — Op Note (Signed)
[name], [name]               ACCOUNT NO.:  [id number]   MEDICAL RECORD NO.:  [id number]          PATIENT TYPE:  OBV   LOCATION:  9399                          FACILITY:  [facility]   PHYSICIAN:  Maxie Better, M.D.DATE OF BIRTH:  12-05-1945   DATE OF PROCEDURE:  04/07/2005  DATE OF DISCHARGE:                                 OPERATIVE REPORT   PREOPERATIVE DIAGNOSIS:  Prosthodontia.   OPERATION/PROCEDURE:  Total vaginal hysterectomy and right salpingo-  oophorectomy.   PREOPERATIVE DIAGNOSIS:  Prosthodontia.   ANESTHESIA:  General.   SURGEON:  Maxie Better, M.D.   ASSISTANT:  Randye Lobo, M.D.   DESCRIPTION OF PROCEDURE:  Under adequate general anesthesia, the patient  was placed in the dorsal lithotomy position. She was thoroughly prepped and  draped in the usual fashion.  Examination was notable for the cervix with  the uterus outside of the vagina.  An indwelling Foley catheter had been  sterilely placed.  A weighted speculum was placed in the vagina.  The cervix  that was noted to be flush to the vagina was gently and carefully grasped  anteriorly and posteriorly.  A dilute solution of Pitressin was injected  circumferentially.  A circumferential incision was then made and sharp  dissection using Mayo scissors was then utilized anteriorly.  The cervix was  noted to be extremely elongated.  Posteriorly the cul de sac was  subsequently opened.  The peritoneum posteriorly was then opened  transversely.  The uterosacral ligaments were then bilaterally clamped, cut  and suture ligated with 0 Vicryl suture.  The anterior cul-de-sac was still  not opened.  Smaller clamping of cardinal ligaments were then performed  bilaterally, cut and suture ligated with 0 Vicryl.The uterine vessels were  clamped, cuaterized and cut.  At that point the anterior cul-de-sac was then  reevaluated and ultimately the anterior cul-de-sac was opened transversely.  The left tube and ovary  were identified.  The right tube and ovary were also  identified.  The right tube and ovary were more mobile.  The  infundibulopelvic ligament on the right was then severed sequentially using  LigaSure.  Small bleeder was then noted on the right lateral wall which was  carefully clamped at the pedicle and free-tied with 0 Vicryl x2.  The left  fallopian tube was elongated.  However, the left ovary, which was normal in  size, was fixed to the pelvic sidewall and, therefore,  not amenable for  removal.  Decision was then made to remove the left uteroovarian ligament by  using LigaSure.  Uterus and cervix were then removed with anterior fundal  fibroid noted.  The posterior cuff was then oversewn with 0 Vicryl running  locked stitch.  Good hemostasis was subsequently noted.  At that point Dr.  Edward Jolly performed her anterior and posterior repair and additional procedures.  Please see her dictated operative report for the remainder of the case.   Specimens were uterus with cervix and right tube and ovary.  Estimated blood  loss for the hysterectomy was 100 mL.  There were no complications.  The  patient  tolerated the procedure well.  Urine output was 200 mL clear yellow  urine.  Intraoperative fluid was 1500 mL.  The patient tolerated the  procedure well and was transferred to the recovery room at the closure of  all her procedures.      Maxie Better, M.D.  Electronically Signed     [place]/MEDQ  D:  04/07/2005  T:  04/07/2005  Job:  [id number]

## 2010-12-19 ENCOUNTER — Other Ambulatory Visit: Payer: Self-pay | Admitting: *Deleted

## 2010-12-19 ENCOUNTER — Ambulatory Visit: Payer: Medicare Other | Admitting: Internal Medicine

## 2010-12-19 MED ORDER — LOVASTATIN 20 MG PO TABS: 20.0000 mg | ORAL_TABLET | Freq: Every day | ORAL | Status: DC

## 2010-12-19 MED ORDER — LISINOPRIL 5 MG PO TABS: 5.0000 mg | ORAL_TABLET | Freq: Every day | ORAL | Status: DC

## 2010-12-19 MED ORDER — LEVOTHYROXINE SODIUM 75 MCG PO TABS: 75.0000 ug | ORAL_TABLET | Freq: Every day | ORAL | Status: DC

## 2010-12-19 MED ORDER — ALENDRONATE SODIUM 70 MG PO TABS: 70.0000 mg | ORAL_TABLET | ORAL | Status: DC

## 2010-12-19 NOTE — Telephone Encounter (Signed)
R'cd fax from [facility] for refills of Lisinopril, Levothyroxine, Fosamax, and Lovastatin  Last OV-05/2010

## 2010-12-22 ENCOUNTER — Other Ambulatory Visit: Payer: Self-pay | Admitting: Internal Medicine

## 2010-12-22 ENCOUNTER — Other Ambulatory Visit (INDEPENDENT_AMBULATORY_CARE_PROVIDER_SITE_OTHER): Payer: Medicare Other

## 2010-12-22 DIAGNOSIS — Z79899 Other long term (current) drug therapy: Secondary | ICD-10-CM

## 2010-12-22 DIAGNOSIS — Z0000 Encounter for general adult medical examination without abnormal findings: Secondary | ICD-10-CM

## 2010-12-22 LAB — LIPID PANEL
Cholesterol: 198 mg/dL (ref 0–200)
LDL Cholesterol: 118 mg/dL — ABNORMAL HIGH (ref 0–99)
Total CHOL/HDL Ratio: 3
VLDL: 9 mg/dL (ref 0.0–40.0)

## 2010-12-22 LAB — HEPATIC FUNCTION PANEL
AST: 20 U/L (ref 0–37)
Bilirubin, Direct: 0 mg/dL (ref 0.0–0.3)
Total Bilirubin: 0.4 mg/dL (ref 0.3–1.2)

## 2010-12-22 LAB — URINALYSIS, ROUTINE W REFLEX MICROSCOPIC
Bilirubin Urine: NEGATIVE
Ketones, ur: NEGATIVE
Total Protein, Urine: NEGATIVE
pH: 5.5 (ref 5.0–8.0)

## 2010-12-22 LAB — BASIC METABOLIC PANEL
BUN: 15 mg/dL (ref 6–23)
Chloride: 110 mEq/L (ref 96–112)
GFR: 99.03 mL/min (ref >60.00)
Potassium: 4.4 mEq/L (ref 3.5–5.1)

## 2010-12-22 LAB — CBC WITH DIFFERENTIAL/PLATELET
Basophils Relative: 1.4 % (ref 0.0–3.0)
Eosinophils Absolute: 0.1 10*3/uL (ref 0.0–0.7)
Eosinophils Relative: 3.1 % (ref 0.0–5.0)
HCT: 37.7 % (ref 36.0–46.0)
Lymphs Abs: 0.8 10*3/uL (ref 0.7–4.0)
MCHC: 33.6 g/dL (ref 30.0–36.0)
MCV: 88.2 fl (ref 78.0–100.0)
Monocytes Absolute: 0.4 10*3/uL (ref 0.1–1.0)
Platelets: 254 10*3/uL (ref 150.0–400.0)
RBC: 4.28 Mil/uL (ref 3.87–5.11)
WBC: 3.6 10*3/uL — ABNORMAL LOW (ref 4.5–10.5)

## 2010-12-22 MED ORDER — CEPHALEXIN 500 MG PO CAPS: 500.0000 mg | ORAL_CAPSULE | Freq: Four times a day (QID) | ORAL | Status: AC

## 2010-12-22 NOTE — Telephone Encounter (Signed)
Ok for cephalexin course - done per [employer]

## 2010-12-22 NOTE — Telephone Encounter (Signed)
Message copied by Corwin Levins on Mon Dec 22, 2010  4:35 PM ------      Message from: Scharlene Gloss B      Created: Mon Dec 22, 2010  3:37 PM       Called the patient and she is having to go more often and having some discomfort.

## 2010-12-26 ENCOUNTER — Ambulatory Visit (INDEPENDENT_AMBULATORY_CARE_PROVIDER_SITE_OTHER): Payer: Medicare Other | Admitting: Internal Medicine

## 2010-12-26 ENCOUNTER — Encounter: Payer: Self-pay | Admitting: Internal Medicine

## 2010-12-26 VITALS — BP 122/70 | HR 101 | Temp 99.3°F | Ht 60.0 in | Wt 200.0 lb

## 2010-12-26 DIAGNOSIS — Z0000 Encounter for general adult medical examination without abnormal findings: Secondary | ICD-10-CM

## 2010-12-26 DIAGNOSIS — J309 Allergic rhinitis, unspecified: Secondary | ICD-10-CM | POA: Insufficient documentation

## 2010-12-26 HISTORY — DX: Allergic rhinitis, unspecified: J30.9

## 2010-12-26 MED ORDER — FEXOFENADINE HCL 180 MG PO TABS: 180.0000 mg | ORAL_TABLET | Freq: Every day | ORAL | Status: DC

## 2010-12-26 NOTE — Assessment & Plan Note (Signed)

## 2010-12-26 NOTE — Patient Instructions (Addendum)
Take all new medications as prescribed - the antibiotic, and the allergy medication Continue all other medications as before Please remember to followup with your GYN for the yearly pap smear and/or mammogram as you do Please return in 1 year for your yearly visit, or sooner if needed, with Lab testing done 3-5 days before

## 2010-12-27 ENCOUNTER — Encounter: Payer: Self-pay | Admitting: Internal Medicine

## 2010-12-27 NOTE — Assessment & Plan Note (Signed)
For allegra prn,  to f/u any worsening symptoms or concerns  

## 2010-12-27 NOTE — Progress Notes (Signed)
Subjective:    Patient ID: [name], female    DOB: 1945-07-13, 65 y.o.   MRN: [id number]  HPI Here for wellness and f/u;  Overall doing ok;  Pt denies CP, worsening SOB, DOE, wheezing, orthopnea, PND, worsening LE edema, palpitations, dizziness or syncope.  Pt denies neurological change such as new Headache, facial or extremity weakness.  Pt denies polydipsia, polyuria, or low sugar symptoms. Pt states overall good compliance with treatment and medications, good tolerability, and trying to follow lower cholesterol diet.  Pt denies worsening depressive symptoms, suicidal ideation or panic. No fever, wt loss, night sweats, loss of appetite, or other constitutional symptoms.  Pt states good ability with ADL's, low fall risk, home safety reviewed and adequate, no significant changes in hearing or vision, and occasionally active with exercise.  Has mild urinary freq, not yet started the keflex for UTI.  Does have several wks ongoing nasal allergy symptoms with clear congestion, itch and sneeze, without fever, pain, ST, cough or wheezing. Past Medical History  Diagnosis Date  . HYPOTHYROIDISM 11/25/2006  . HYPERCHOLESTEROLEMIA 11/25/2006  . HYPERLIPIDEMIA 11/18/2009  . OBESITY 12/04/2006  . DEPRESSION 11/25/2006  . OTITIS MEDIA, ACUTE, LEFT 11/18/2009  . HYPERTENSION 07/15/2007  . PULMONARY NODULE 11/25/2006  . DIVERTICULOSIS, COLON 12/04/2006  . POSTMENOPAUSAL STATUS 11/25/2006  . DEGENERATIVE JOINT DISEASE 11/25/2006  . OSTEOPENIA 11/25/2006  . DIZZINESS 11/18/2009  . PALPITATIONS, RECURRENT 07/20/2008  . HYPERPARATHYROIDISM, HX OF 11/25/2006  . Vitamin D deficiency   . DJD (degenerative joint disease)     Bilateral Hip  . Allergic rhinitis, cause unspecified 12/26/2010   Past Surgical History  Procedure Date  . Left knee   . Right wrist   . Abdominal hysterectomy   . Total hip arthroplasty     left 3/05, right 2002    reports that she has never smoked. She does not have any smokeless tobacco  history on file. She reports that she does not drink alcohol or use illicit drugs. family history includes Hypertension in her other. No Known Allergies Current Outpatient Prescriptions on File Prior to Visit  Medication Sig Dispense Refill  . alendronate (FOSAMAX) 70 MG tablet Take 1 tablet (70 mg total) by mouth every 7 (seven) days. Take in the morning with a full glass of water, on an empty stomach, and do not take anything else by mouth or lie down for the next 30 min.  12 tablet  2  . aspirin 81 MG EC tablet Take 81 mg by mouth daily.        . Cholecalciferol (VITAMIN D3) 1000 UNITS capsule Take 1,000 Units by mouth daily.        Marland Kitchen levothyroxine (SYNTHROID, LEVOTHROID) 75 MCG tablet Take 1 tablet (75 mcg total) by mouth daily.  90 tablet  2  . lisinopril (PRINIVIL,ZESTRIL) 5 MG tablet Take 1 tablet (5 mg total) by mouth daily.  90 tablet  2  . lovastatin (MEVACOR) 20 MG tablet Take 1 tablet (20 mg total) by mouth daily.  90 tablet  2  . cephALEXin (KEFLEX) 500 MG capsule Take 1 capsule (500 mg total) by mouth 4 (four) times daily.  40 capsule  0   Review of Systems Review of Systems  Constitutional: Negative for diaphoresis, activity change, appetite change and unexpected weight change.  HENT: Negative for hearing loss, ear pain, facial swelling, mouth sores and neck stiffness.   Eyes: Negative for pain, redness and visual disturbance.  Respiratory: Negative for shortness of breath  and wheezing.   Cardiovascular: Negative for chest pain and palpitations.  Gastrointestinal: Negative for diarrhea, blood in stool, abdominal distention and rectal pain.  Genitourinary: Negative for hematuria, flank pain and decreased urine volume.  Musculoskeletal: Negative for myalgias and joint swelling.  Skin: Negative for color change and wound.  Neurological: Negative for syncope and numbness.  Hematological: Negative for adenopathy.  Psychiatric/Behavioral: Negative for hallucinations, self-injury,  decreased concentration and agitation.      Objective:   Physical Exam BP 122/70  Pulse 101  Temp(Src) 99.3 F (37.4 C) (Oral)  Ht 5' (1.524 m)  Wt 200 lb (90.719 kg)  BMI 39.06 kg/m2  SpO2 98% Physical Exam  VS noted Constitutional: Pt is oriented to person, place, and time. Appears well-developed and well-nourished.  HENT:  Head: Normocephalic and atraumatic.  Right Ear: External ear normal.  Left Ear: External ear normal.  Nose: Nose normal.  Mouth/Throat: Oropharynx is clear and moist.  Bilat tm's mild erythema.  Sinus nontender.  Pharynx mild erythema Eyes: Conjunctivae and EOM are normal. Pupils are equal, round, and reactive to light.  Neck: Normal range of motion. Neck supple. No JVD present. No tracheal deviation present.  Cardiovascular: Normal rate, regular rhythm, normal heart sounds and intact distal pulses.   Pulmonary/Chest: Effort normal and breath sounds normal.  Abdominal: Soft. Bowel sounds are normal. There is no tenderness.  Musculoskeletal: Normal range of motion. Exhibits no edema.  Lymphadenopathy:  Has no cervical adenopathy.  Neurological: Pt is alert and oriented to person, place, and time. Pt has normal reflexes. No cranial nerve deficit.  Skin: Skin is warm and dry. No rash noted.  Psychiatric:  Has  normal mood and affect. Behavior is normal.     Assessment & Plan:

## 2011-01-22 ENCOUNTER — Encounter: Payer: Self-pay | Admitting: Internal Medicine

## 2011-06-23 ENCOUNTER — Other Ambulatory Visit (HOSPITAL_COMMUNITY): Payer: Self-pay | Admitting: Obstetrics and Gynecology

## 2011-06-23 DIAGNOSIS — Z1231 Encounter for screening mammogram for malignant neoplasm of breast: Secondary | ICD-10-CM

## 2011-07-17 ENCOUNTER — Ambulatory Visit (HOSPITAL_COMMUNITY)
Admission: RE | Admit: 2011-07-17 | Discharge: 2011-07-17 | Disposition: A | Discharge status: Home / Self Care | Payer: Medicare Other | Source: Ambulatory Visit | Attending: Obstetrics and Gynecology | Admitting: Obstetrics and Gynecology

## 2011-07-17 DIAGNOSIS — Z1231 Encounter for screening mammogram for malignant neoplasm of breast: Secondary | ICD-10-CM

## 2011-09-24 ENCOUNTER — Other Ambulatory Visit: Payer: Self-pay | Admitting: Internal Medicine

## 2011-11-24 ENCOUNTER — Other Ambulatory Visit: Payer: Self-pay | Admitting: Internal Medicine

## 2011-12-14 ENCOUNTER — Other Ambulatory Visit: Payer: Self-pay | Admitting: Internal Medicine

## 2011-12-29 ENCOUNTER — Other Ambulatory Visit: Payer: Self-pay | Admitting: Internal Medicine

## 2012-01-01 ENCOUNTER — Encounter: Payer: Self-pay | Admitting: Internal Medicine

## 2012-01-01 ENCOUNTER — Ambulatory Visit (INDEPENDENT_AMBULATORY_CARE_PROVIDER_SITE_OTHER): Payer: Medicare Other | Admitting: Internal Medicine

## 2012-01-01 ENCOUNTER — Other Ambulatory Visit (INDEPENDENT_AMBULATORY_CARE_PROVIDER_SITE_OTHER): Payer: Medicare Other

## 2012-01-01 VITALS — BP 120/62 | HR 122 | Temp 99.8°F | Ht 59.0 in | Wt 203.2 lb

## 2012-01-01 DIAGNOSIS — Z0000 Encounter for general adult medical examination without abnormal findings: Secondary | ICD-10-CM

## 2012-01-01 DIAGNOSIS — Z136 Encounter for screening for cardiovascular disorders: Secondary | ICD-10-CM

## 2012-01-01 DIAGNOSIS — Z79899 Other long term (current) drug therapy: Secondary | ICD-10-CM

## 2012-01-01 DIAGNOSIS — I10 Essential (primary) hypertension: Secondary | ICD-10-CM

## 2012-01-01 DIAGNOSIS — E785 Hyperlipidemia, unspecified: Secondary | ICD-10-CM

## 2012-01-01 LAB — URINALYSIS, ROUTINE W REFLEX MICROSCOPIC
Bilirubin Urine: NEGATIVE
Ketones, ur: NEGATIVE
Urine Glucose: NEGATIVE
Urobilinogen, UA: 1 (ref 0.0–1.0)

## 2012-01-01 LAB — HEPATIC FUNCTION PANEL
AST: 21 U/L (ref 0–37)
Albumin: 4.2 g/dL (ref 3.5–5.2)
Alkaline Phosphatase: 50 U/L (ref 39–117)
Bilirubin, Direct: 0.1 mg/dL (ref 0.0–0.3)
Total Bilirubin: 0.4 mg/dL (ref 0.3–1.2)

## 2012-01-01 LAB — CBC WITH DIFFERENTIAL/PLATELET
Basophils Absolute: 0.1 10*3/uL (ref 0.0–0.1)
Eosinophils Absolute: 0.1 10*3/uL (ref 0.0–0.7)
HCT: 39.8 % (ref 36.0–46.0)
Hemoglobin: 12.9 g/dL (ref 12.0–15.0)
Lymphs Abs: 1.1 10*3/uL (ref 0.7–4.0)
MCHC: 32.3 g/dL (ref 30.0–36.0)
MCV: 90 fl (ref 78.0–100.0)
Monocytes Absolute: 0.5 10*3/uL (ref 0.1–1.0)
Neutro Abs: 2.5 10*3/uL (ref 1.4–7.7)
Platelets: 246 10*3/uL (ref 150.0–400.0)
RDW: 13.7 % (ref 11.5–14.6)

## 2012-01-01 LAB — BASIC METABOLIC PANEL
GFR: 103.48 mL/min (ref >60.00)
Glucose, Bld: 108 mg/dL — ABNORMAL HIGH (ref 70–99)
Potassium: 4.2 mEq/L (ref 3.5–5.1)
Sodium: 138 mEq/L (ref 135–145)

## 2012-01-01 LAB — LIPID PANEL
HDL: 71 mg/dL (ref >39.00)
Total CHOL/HDL Ratio: 3

## 2012-01-01 LAB — TSH: TSH: 5.59 u[IU]/mL — ABNORMAL HIGH (ref 0.35–5.50)

## 2012-01-01 MED ORDER — LOVASTATIN 20 MG PO TABS: 20.0000 mg | ORAL_TABLET | Freq: Every day | ORAL | Status: DC

## 2012-01-01 MED ORDER — ALENDRONATE SODIUM 70 MG PO TABS: 70.0000 mg | ORAL_TABLET | ORAL | Status: DC

## 2012-01-01 MED ORDER — LEVOTHYROXINE SODIUM 75 MCG PO TABS: 75.0000 ug | ORAL_TABLET | Freq: Every day | ORAL | Status: DC

## 2012-01-01 MED ORDER — LISINOPRIL 5 MG PO TABS: 5.0000 mg | ORAL_TABLET | Freq: Every day | ORAL | Status: DC

## 2012-01-01 NOTE — Patient Instructions (Addendum)
Please return if you change your mind about the flu shot, pneumonia, or the shingles shot Continue all other medications as before Your refills were all done today Please go to LAB in the Basement for the blood and/or urine tests to be done today You will be contacted by phone if any changes need to be made immediately.  Otherwise, you will receive a letter about your results with an explanation, but please check with MyChart first. Please remember to sign up for My Chart at your earliest convenience, as this will be important to you in the future with finding out test results. You are otherwise up to date with prevention measures Please return in 1 year for your yearly visit, or sooner if needed, with Lab testing done 3-5 days before

## 2012-01-01 NOTE — Assessment & Plan Note (Addendum)
Overall doing well, age appropriate education and counseling updated, referrals for preventative services and immunizations addressed, dietary and smoking counseling addressed, most recent labs and ECG reviewed.  I have personally reviewed and have noted: 1) the patient's medical and social history 2) The pt's use of alcohol, tobacco, and illicit drugs 3) The patient's current medications and supplements 4) Functional ability including ADL's, fall risk, home safety risk, hearing and visual impairment 5) Diet and physical activities 6) Evidence for depression or mood disorder 7) The patient's height, weight, and BMI have been recorded in the chart I have made referrals, and provided counseling and education based on review of the above ECG reviewed as per emr, declines immunizations

## 2012-01-03 ENCOUNTER — Encounter: Payer: Self-pay | Admitting: Internal Medicine

## 2012-01-03 NOTE — Progress Notes (Signed)
Subjective:    Patient ID: [name], female    DOB: 03-22-1945, 66 y.o.   MRN: [id number]  HPI  Here for wellness and f/u;  Overall doing ok;  Pt denies CP, worsening SOB, DOE, wheezing, orthopnea, PND, worsening LE edema, palpitations, dizziness or syncope.  Pt denies neurological change such as new Headache, facial or extremity weakness.  Pt denies polydipsia, polyuria, or low sugar symptoms. Pt states overall good compliance with treatment and medications, good tolerability, and trying to follow lower cholesterol diet.  Pt denies worsening depressive symptoms, suicidal ideation or panic. No fever, wt loss, night sweats, loss of appetite, or other constitutional symptoms.  Pt states good ability with ADL's, low fall risk, home safety reviewed and adequate, no significant changes in hearing or vision, and occasionally active with exercise.  Declines all immunizations.  Is s/p bladder surgury without incontinence now but has some freqeuency symptoms returning. Still works as Surveyor, mining, likes to keep busy. Past Medical History  Diagnosis Date  . HYPOTHYROIDISM 11/25/2006  . HYPERCHOLESTEROLEMIA 11/25/2006  . HYPERLIPIDEMIA 11/18/2009  . OBESITY 12/04/2006  . DEPRESSION 11/25/2006  . OTITIS MEDIA, ACUTE, LEFT 11/18/2009  . HYPERTENSION 07/15/2007  . PULMONARY NODULE 11/25/2006  . DIVERTICULOSIS, COLON 12/04/2006  . POSTMENOPAUSAL STATUS 11/25/2006  . DEGENERATIVE JOINT DISEASE 11/25/2006  . OSTEOPENIA 11/25/2006  . DIZZINESS 11/18/2009  . PALPITATIONS, RECURRENT 07/20/2008  . HYPERPARATHYROIDISM, HX OF 11/25/2006  . Vitamin D deficiency   . DJD (degenerative joint disease)     Bilateral Hip  . Allergic rhinitis, cause unspecified 12/26/2010   Past Surgical History  Procedure Date  . Left knee   . Right wrist   . Abdominal hysterectomy   . Total hip arthroplasty     left 3/05, right 2002    reports that she has never smoked. She does not have any smokeless tobacco history on file. She  reports that she does not drink alcohol or use illicit drugs. family history includes Hypertension in her other. No Known Allergies Current Outpatient Prescriptions on File Prior to Visit  Medication Sig Dispense Refill  . aspirin 81 MG EC tablet Take 81 mg by mouth daily.        . Cholecalciferol (VITAMIN D3) 1000 UNITS capsule Take 1,000 Units by mouth daily.        Marland Kitchen levothyroxine (SYNTHROID, LEVOTHROID) 75 MCG tablet Take 1 tablet (75 mcg total) by mouth daily.  90 tablet  3  . lisinopril (PRINIVIL,ZESTRIL) 5 MG tablet Take 1 tablet (5 mg total) by mouth daily.  90 tablet  3  . lovastatin (MEVACOR) 20 MG tablet Take 1 tablet (20 mg total) by mouth daily.  90 tablet  3  . fexofenadine (ALLEGRA) 180 MG tablet Take 1 tablet (180 mg total) by mouth daily.  30 tablet  2   Review of Systems Review of Systems  Constitutional: Negative for diaphoresis, activity change, appetite change and unexpected weight change.  HENT: Negative for hearing loss, ear pain, facial swelling, mouth sores and neck stiffness.   Eyes: Negative for pain, redness and visual disturbance.  Respiratory: Negative for shortness of breath and wheezing.   Cardiovascular: Negative for chest pain and palpitations.  Gastrointestinal: Negative for diarrhea, blood in stool, abdominal distention and rectal pain.  Genitourinary: Negative for hematuria, flank pain and decreased urine volume.  Musculoskeletal: Negative for myalgias and joint swelling.  Skin: Negative for color change and wound.  Neurological: Negative for syncope and numbness.  Hematological: Negative for adenopathy.  Psychiatric/Behavioral: Negative for hallucinations, self-injury, decreased concentration and agitation.      Objective:   Physical Exam BP 120/62  Pulse 122  Temp 99.8 F (37.7 C) (Oral)  Ht 4\' 11"  (1.499 m)  Wt 203 lb 4 oz (92.194 kg)  BMI 41.05 kg/m2  SpO2 97% Physical Exam  VS noted Constitutional: Pt is oriented to person, place, and  time. Appears well-developed and well-nourished.  HENT:  Head: Normocephalic and atraumatic.  Right Ear: External ear normal.  Left Ear: External ear normal.  Nose: Nose normal.  Mouth/Throat: Oropharynx is clear and moist.  Eyes: Conjunctivae and EOM are normal. Pupils are equal, round, and reactive to light.  Neck: Normal range of motion. Neck supple. No JVD present. No tracheal deviation present.  Cardiovascular: Normal rate, regular rhythm, normal heart sounds and intact distal pulses.   Pulmonary/Chest: Effort normal and breath sounds normal.  Abdominal: Soft. Bowel sounds are normal. There is no tenderness.  Musculoskeletal: Normal range of motion. Exhibits no edema.  Lymphadenopathy:  Has no cervical adenopathy.  Neurological: Pt is alert and oriented to person, place, and time. Pt has normal reflexes. No cranial nerve deficit.  Skin: Skin is warm and dry. No rash noted.  Psychiatric:  Has  normal mood and affect. Behavior is normal.     Assessment & Plan:

## 2012-01-05 ENCOUNTER — Encounter: Payer: Self-pay | Admitting: Internal Medicine

## 2012-02-18 ENCOUNTER — Encounter (HOSPITAL_COMMUNITY): Payer: Self-pay | Admitting: Emergency Medicine

## 2012-02-18 ENCOUNTER — Emergency Department (HOSPITAL_COMMUNITY)
Admission: EM | Admit: 2012-02-18 | Discharge: 2012-02-18 | Disposition: A | Discharge status: Home / Self Care | Payer: Medicare Other | Attending: Emergency Medicine | Admitting: Emergency Medicine

## 2012-02-18 DIAGNOSIS — E785 Hyperlipidemia, unspecified: Secondary | ICD-10-CM | POA: Insufficient documentation

## 2012-02-18 DIAGNOSIS — I10 Essential (primary) hypertension: Secondary | ICD-10-CM | POA: Insufficient documentation

## 2012-02-18 DIAGNOSIS — Z79899 Other long term (current) drug therapy: Secondary | ICD-10-CM | POA: Insufficient documentation

## 2012-02-18 DIAGNOSIS — Z8669 Personal history of other diseases of the nervous system and sense organs: Secondary | ICD-10-CM | POA: Insufficient documentation

## 2012-02-18 DIAGNOSIS — Z8719 Personal history of other diseases of the digestive system: Secondary | ICD-10-CM | POA: Insufficient documentation

## 2012-02-18 DIAGNOSIS — E039 Hypothyroidism, unspecified: Secondary | ICD-10-CM | POA: Insufficient documentation

## 2012-02-18 DIAGNOSIS — Z8659 Personal history of other mental and behavioral disorders: Secondary | ICD-10-CM | POA: Insufficient documentation

## 2012-02-18 DIAGNOSIS — Z8739 Personal history of other diseases of the musculoskeletal system and connective tissue: Secondary | ICD-10-CM | POA: Insufficient documentation

## 2012-02-18 DIAGNOSIS — Z7982 Long term (current) use of aspirin: Secondary | ICD-10-CM | POA: Insufficient documentation

## 2012-02-18 DIAGNOSIS — K625 Hemorrhage of anus and rectum: Secondary | ICD-10-CM

## 2012-02-18 DIAGNOSIS — Z8679 Personal history of other diseases of the circulatory system: Secondary | ICD-10-CM | POA: Insufficient documentation

## 2012-02-18 DIAGNOSIS — E7800 Pure hypercholesterolemia, unspecified: Secondary | ICD-10-CM | POA: Insufficient documentation

## 2012-02-18 LAB — CBC WITH DIFFERENTIAL/PLATELET
Basophils Absolute: 0 10*3/uL (ref 0.0–0.1)
Eosinophils Relative: 2 % (ref 0–5)
Lymphocytes Relative: 24 % (ref 12–46)
MCV: 87.1 fL (ref 78.0–100.0)
Neutro Abs: 2.5 10*3/uL (ref 1.7–7.7)
Neutrophils Relative %: 63 % (ref 43–77)
Platelets: 268 10*3/uL (ref 150–400)
RBC: 4.41 MIL/uL (ref 3.87–5.11)
RDW: 13 % (ref 11.5–15.5)
WBC: 4 10*3/uL (ref 4.0–10.5)

## 2012-02-18 LAB — COMPREHENSIVE METABOLIC PANEL
ALT: 12 U/L (ref 0–35)
AST: 22 U/L (ref 0–37)
Alkaline Phosphatase: 54 U/L (ref 39–117)
CO2: 21 mEq/L (ref 19–32)
Calcium: 11.4 mg/dL — ABNORMAL HIGH (ref 8.4–10.5)
GFR calc non Af Amer: 87 mL/min — ABNORMAL LOW (ref >90)
Potassium: 4.4 mEq/L (ref 3.5–5.1)
Sodium: 136 mEq/L (ref 135–145)

## 2012-02-18 LAB — URINALYSIS, ROUTINE W REFLEX MICROSCOPIC
Bilirubin Urine: NEGATIVE
Specific Gravity, Urine: 1.015 (ref 1.005–1.030)
Urobilinogen, UA: 1 mg/dL (ref 0.0–1.0)
pH: 6 (ref 5.0–8.0)

## 2012-02-18 LAB — URINE MICROSCOPIC-ADD ON

## 2012-02-18 NOTE — ED Provider Notes (Signed)
History     CSN: [id number]  Arrival date & time 02/18/12  1350   First MD Initiated Contact with Patient 02/18/12 1504      Chief Complaint  Patient presents with  . GI Bleeding    (Consider location/radiation/quality/duration/timing/severity/associated sxs/prior treatment) HPI Comments: Patient presents with rectal bleeding. She states she's had some intermittent spotting with bowel movements over the last 2 months usually happens only couple times a month. Today she noticed some heavier bleeding with dark clots mixed with her stool. She had one episode earlier this morning she's had no further bowel movements. She denies any abdominal pain. She had a cramp in her low back earlier this morning but denies any current discomfort. She denies he nausea or vomiting. She denies any constipation or pain with bowel movements. She denies any dizziness. She denies any chest pain or shortness of breath. She denies a history of GI bleeding in the past.   Past Medical History  Diagnosis Date  . HYPOTHYROIDISM 11/25/2006  . HYPERCHOLESTEROLEMIA 11/25/2006  . HYPERLIPIDEMIA 11/18/2009  . OBESITY 12/04/2006  . DEPRESSION 11/25/2006  . OTITIS MEDIA, ACUTE, LEFT 11/18/2009  . HYPERTENSION 07/15/2007  . PULMONARY NODULE 11/25/2006  . DIVERTICULOSIS, COLON 12/04/2006  . POSTMENOPAUSAL STATUS 11/25/2006  . DEGENERATIVE JOINT DISEASE 11/25/2006  . OSTEOPENIA 11/25/2006  . DIZZINESS 11/18/2009  . PALPITATIONS, RECURRENT 07/20/2008  . HYPERPARATHYROIDISM, HX OF 11/25/2006  . Vitamin D deficiency   . DJD (degenerative joint disease)     Bilateral Hip  . Allergic rhinitis, cause unspecified 12/26/2010    Past Surgical History  Procedure Date  . Left knee   . Right wrist   . Abdominal hysterectomy   . Total hip arthroplasty     left 3/05, right 2002    Family History  Problem Relation Age of Onset  . Hypertension Other     History  Substance Use Topics  . Smoking status: Never Smoker   . Smokeless  tobacco: Not on file  . Alcohol Use: No    OB History    Grav Para Term Preterm Abortions TAB SAB Ect Mult Living                  Review of Systems  Constitutional: Negative for fever, chills, diaphoresis and fatigue.  HENT: Negative for congestion, rhinorrhea and sneezing.   Eyes: Negative.   Respiratory: Negative for cough, chest tightness and shortness of breath.   Cardiovascular: Negative for chest pain and leg swelling.  Gastrointestinal: Positive for blood in stool. Negative for nausea, vomiting, abdominal pain and diarrhea.  Genitourinary: Negative for frequency, hematuria, flank pain and difficulty urinating.  Musculoskeletal: Positive for back pain. Negative for arthralgias.  Skin: Negative for rash.  Neurological: Negative for dizziness, speech difficulty, weakness, numbness and headaches.    Allergies  Review of patient's allergies indicates no known allergies.  Home Medications   Current Outpatient Rx  Name  Route  Sig  Dispense  Refill  . ALENDRONATE SODIUM 70 MG PO TABS   Oral   Take 70 mg by mouth every 7 (seven) days. Take on sundays Take with a full glass of water on an empty stomach.         . ASPIRIN 81 MG PO TBEC   Oral   Take 81 mg by mouth every morning.          Marland Kitchen VITAMIN D 1000 UNITS PO TABS   Oral   Take 1,000 Units by mouth daily.         Marland Kitchen  LEVOTHYROXINE SODIUM 75 MCG PO TABS   Oral   Take 75 mcg by mouth daily before breakfast.         . LISINOPRIL 5 MG PO TABS   Oral   Take 5 mg by mouth every morning.         Marland Kitchen LOVASTATIN 20 MG PO TABS   Oral   Take 20 mg by mouth.         . PSYLLIUM 95 % PO PACK   Oral   Take 1 packet by mouth daily as needed. For constipation         . FEXOFENADINE HCL 180 MG PO TABS   Oral   Take 1 tablet (180 mg total) by mouth daily.   30 tablet   2     BP 154/84  Pulse 98  Temp 98.5 F (36.9 C) (Oral)  Resp 16  SpO2 100%  Physical Exam  Constitutional: She is oriented to  person, place, and time. She appears well-developed and well-nourished.  HENT:  Head: Normocephalic and atraumatic.  Mouth/Throat: Oropharynx is clear and moist.  Eyes: Conjunctivae normal are normal. Pupils are equal, round, and reactive to light.  Neck: Normal range of motion. Neck supple.  Cardiovascular: Normal rate, regular rhythm and normal heart sounds.   Pulmonary/Chest: Effort normal and breath sounds normal. No respiratory distress. She has no wheezes. She has no rales. She exhibits no tenderness.  Abdominal: Soft. Bowel sounds are normal. There is no tenderness. There is no rebound and no guarding.  Musculoskeletal: Normal range of motion. She exhibits no edema.  Lymphadenopathy:    She has no cervical adenopathy.  Neurological: She is alert and oriented to person, place, and time.  Skin: Skin is warm and dry. No rash noted.  Psychiatric: She has a normal mood and affect.    ED Course  Procedures (including critical care time)  Results for orders placed during the hospital encounter of 02/18/12  CBC WITH DIFFERENTIAL      Component Value Range   WBC 4.0  4.0 - 10.5 K/uL   RBC 4.41  3.87 - 5.11 MIL/uL   Hemoglobin 12.8  12.0 - 15.0 g/dL   HCT 16.1  09.6 - 04.5 %   MCV 87.1  78.0 - 100.0 fL   MCH 29.0  26.0 - 34.0 pg   MCHC 33.3  30.0 - 36.0 g/dL   RDW 40.9  81.1 - 91.4 %   Platelets 268  150 - 400 K/uL   Neutrophils Relative 63  43 - 77 %   Neutro Abs 2.5  1.7 - 7.7 K/uL   Lymphocytes Relative 24  12 - 46 %   Lymphs Abs 1.0  0.7 - 4.0 K/uL   Monocytes Relative 12  3 - 12 %   Monocytes Absolute 0.5  0.1 - 1.0 K/uL   Eosinophils Relative 2  0 - 5 %   Eosinophils Absolute 0.1  0.0 - 0.7 K/uL   Basophils Relative 0  0 - 1 %   Basophils Absolute 0.0  0.0 - 0.1 K/uL  COMPREHENSIVE METABOLIC PANEL      Component Value Range   Sodium 136  135 - 145 mEq/L   Potassium 4.4  3.5 - 5.1 mEq/L   Chloride 104  96 - 112 mEq/L   CO2 21  19 - 32 mEq/L   Glucose, Bld 80  70 - 99  mg/dL   BUN 13  6 - 23 mg/dL   Creatinine,  Ser 0.71  0.50 - 1.10 mg/dL   Calcium 16.1 (*) 8.4 - 10.5 mg/dL   Total Protein 7.7  6.0 - 8.3 g/dL   Albumin 4.1  3.5 - 5.2 g/dL   AST 22  0 - 37 U/L   ALT 12  0 - 35 U/L   Alkaline Phosphatase 54  39 - 117 U/L   Total Bilirubin 0.3  0.3 - 1.2 mg/dL   GFR calc non Af Amer 87 (*) >90 mL/min   GFR calc Af Amer >90  >90 mL/min  URINALYSIS, ROUTINE W REFLEX MICROSCOPIC      Component Value Range   Color, Urine YELLOW  YELLOW   APPearance CLOUDY (*) CLEAR   Specific Gravity, Urine 1.015  1.005 - 1.030   pH 6.0  5.0 - 8.0   Glucose, UA NEGATIVE  NEGATIVE mg/dL   Hgb urine dipstick LARGE (*) NEGATIVE   Bilirubin Urine NEGATIVE  NEGATIVE   Ketones, ur 15 (*) NEGATIVE mg/dL   Protein, ur NEGATIVE  NEGATIVE mg/dL   Urobilinogen, UA 1.0  0.0 - 1.0 mg/dL   Nitrite NEGATIVE  NEGATIVE   Leukocytes, UA NEGATIVE  NEGATIVE  URINE MICROSCOPIC-ADD ON      Component Value Range   Squamous Epithelial / LPF FEW (*) RARE   RBC / HPF 3-6  <3 RBC/hpf   No results found.   No results found.   1. Rectal bleeding       MDM  Rectal exam was performed which showed small amount of blood around the perirectal area there is no active bleeding. She's had no further episodes of bloody stools since the one episode this morning. Her hemoglobins normal. I feel that she can be discharged home with close followup. I discussed this with the on-call physician with [facility] who is Dr Arthur Holms. He was agreeable to the plan and patient is to followup tomorrow with Dr. Oliver Barre. I advised her return if she has any recurrence of the bleeding, or she experiences dizziness or shortness of breath.        Rolan Bucco, MD 02/18/12 [phone]

## 2012-02-18 NOTE — ED Notes (Signed)
States that she began seeing some spots of blood a couple of months ago. States today she was passing clots of blood. Also states that she has lower abd pain and back pain.

## 2012-02-19 ENCOUNTER — Ambulatory Visit (INDEPENDENT_AMBULATORY_CARE_PROVIDER_SITE_OTHER): Payer: Medicare Other | Admitting: Internal Medicine

## 2012-02-19 ENCOUNTER — Encounter: Payer: Self-pay | Admitting: Internal Medicine

## 2012-02-19 VITALS — BP 128/82 | HR 111 | Temp 98.0°F | Ht 59.0 in | Wt 201.5 lb

## 2012-02-19 DIAGNOSIS — K600 Acute anal fissure: Secondary | ICD-10-CM

## 2012-02-19 DIAGNOSIS — K625 Hemorrhage of anus and rectum: Secondary | ICD-10-CM

## 2012-02-19 DIAGNOSIS — K602 Anal fissure, unspecified: Secondary | ICD-10-CM

## 2012-02-19 NOTE — Patient Instructions (Signed)
Rectal Bleeding Rectal bleeding is when blood passes out of the anus. It is usually a sign that something is wrong. It may not be serious, but it should always be evaluated. Rectal bleeding may present as bright red blood or extremely dark stools. The color may range from dark red or maroon to black (like tar). It is important that the cause of rectal bleeding be identified so treatment can be started and the problem corrected. CAUSES   Hemorrhoids. These are enlarged (dilated) blood vessels or veins in the anal or rectal area.  Fistulas. Theseare abnormal, burrowing channels that usually run from inside the rectum to the skin around the anus. They can bleed.  Anal fissures. This is a tear in the tissue of the anus. Bleeding occurs with bowel movements.  Diverticulosis. This is a condition in which pockets or sacs project from the bowel wall. Occasionally, the sacs can bleed.  Diverticulitis. Thisis an infection involving diverticulosis of the colon.  Proctitis and colitis. These are conditions in which the rectum, colon, or both, can become inflamed and pitted (ulcerated).  Polyps and cancer. Polyps are non-cancerous (benign) growths in the colon that may bleed. Certain types of polyps turn into cancer.  Protrusion of the rectum. Part of the rectum can project from the anus and bleed.  Certain medicines.  Intestinal infections.  Blood vessel abnormalities. HOME CARE INSTRUCTIONS  Eat a high-fiber diet to keep your stool soft.  Limit activity.  Drink enough fluids to keep your urine clear or pale yellow.  Warm baths may be useful to soothe rectal pain.  Follow up with your caregiver as directed. SEEK IMMEDIATE MEDICAL CARE IF:  You develop increased bleeding.  You have black or dark red stools.  You vomit blood or material that looks like coffee grounds.  You have abdominal pain or tenderness.  You have a fever.  You feel weak, nauseous, or you faint.  You have  severe rectal pain or you are unable to have a bowel movement. MAKE SURE YOU:  Understand these instructions.  Will watch your condition.  Will get help right away if you are not doing well or get worse. Document Released: 07/25/2001 Document Revised: 04/27/2011 Document Reviewed: 07/20/2010 [employer] Patient Information 2013 [employer].  

## 2012-02-19 NOTE — Progress Notes (Signed)
Subjective:    Patient ID: [name], female    DOB: 11/10/1945, 67 y.o.   MRN: [id number]  HPI  Pt presents to the clinic today to f/u from ER visit  on 02/18/12 for rectal bleeding. She noticed 1 BM that had some red blood and clots in it. She went to the ER and her hemoglobin was stable and she did not experience anymore BRBPR so they sent her home and advised her to follow up with her PCP. Since that time, she has had 1 BM with a small amount of blood on it, but not nearly as much as yesterday and without clots. She does strain when going to the bathroom. She has a BM every other day. She is not taking her stool softener like she should. She denies dizziness, lightheadedness, chest pain, shortness of breath or near syncope.   Review of Systems      Past Medical History  Diagnosis Date  . HYPOTHYROIDISM 11/25/2006  . HYPERCHOLESTEROLEMIA 11/25/2006  . HYPERLIPIDEMIA 11/18/2009  . OBESITY 12/04/2006  . DEPRESSION 11/25/2006  . OTITIS MEDIA, ACUTE, LEFT 11/18/2009  . HYPERTENSION 07/15/2007  . PULMONARY NODULE 11/25/2006  . DIVERTICULOSIS, COLON 12/04/2006  . POSTMENOPAUSAL STATUS 11/25/2006  . DEGENERATIVE JOINT DISEASE 11/25/2006  . OSTEOPENIA 11/25/2006  . DIZZINESS 11/18/2009  . PALPITATIONS, RECURRENT 07/20/2008  . HYPERPARATHYROIDISM, HX OF 11/25/2006  . Vitamin D deficiency   . DJD (degenerative joint disease)     Bilateral Hip  . Allergic rhinitis, cause unspecified 12/26/2010    Current Outpatient Prescriptions  Medication Sig Dispense Refill  . alendronate (FOSAMAX) 70 MG tablet Take 70 mg by mouth every 7 (seven) days. Take on sundays Take with a full glass of water on an empty stomach.      Marland Kitchen aspirin 81 MG EC tablet Take 81 mg by mouth every morning.       . cholecalciferol (VITAMIN D) 1000 UNITS tablet Take 1,000 Units by mouth daily.      . fexofenadine (ALLEGRA) 180 MG tablet Take 1 tablet (180 mg total) by mouth daily.  30 tablet  2  . levothyroxine (SYNTHROID,  LEVOTHROID) 75 MCG tablet Take 75 mcg by mouth daily before breakfast.      . lisinopril (PRINIVIL,ZESTRIL) 5 MG tablet Take 5 mg by mouth every morning.      . lovastatin (MEVACOR) 20 MG tablet Take 20 mg by mouth.      . psyllium (HYDROCIL/METAMUCIL) 95 % PACK Take 1 packet by mouth daily as needed. For constipation        No Known Allergies  Family History  Problem Relation Age of Onset  . Hypertension Other     History   Social History  . Marital Status: Married    Spouse Name: N/A    Number of Children: N/A  . Years of Education: N/A   Occupational History  . retired    Social History Main Topics  . Smoking status: Never Smoker   . Smokeless tobacco: Not on file  . Alcohol Use: No  . Drug Use: No  . Sexually Active: Not on file   Other Topics Concern  . Not on file   Social History Narrative  . No narrative on file     Constitutional: Denies fever, malaise, fatigue, headache or abrupt weight changes.  Respiratory: Denies difficulty breathing, shortness of breath, cough or sputum production.   Cardiovascular: Denies chest pain, chest tightness, palpitations or swelling in the hands or feet.  Gastrointestinal: Pt reports blood in stool. Denies abdominal pain, bloating, constipation, diarrhea or  Neurological: Denies dizziness, difficulty with memory, difficulty with speech or problems with balance and coordination.   No other specific complaints in a complete review of systems (except as listed in HPI above).  Objective:   Physical Exam  BP 128/82  Pulse 111  Temp 98 F (36.7 C) (Oral)  Ht 4\' 11"  (1.499 m)  Wt 201 lb 8 oz (91.4 kg)  BMI 40.70 kg/m2  SpO2 93% Wt Readings from Last 3 Encounters:  02/19/12 201 lb 8 oz (91.4 kg)  01/01/12 203 lb 4 oz (92.194 kg)  12/26/10 200 lb (90.719 kg)    General: Appears her stated age, well developed, well nourished in NAD. Cardiovascular: Normal rate and rhythm. S1,S2 noted.  No murmur, rubs or gallops noted. No  JVD or BLE edema. No carotid bruits noted. Pulmonary/Chest: Normal effort and positive vesicular breath sounds. No respiratory distress. No wheezes, rales or ronchi noted.  Abdomen: Soft and nontender. Normal bowel sounds, no bruits noted. No distention or masses noted. Liver, spleen and kidneys non palpable. Neurological: Alert and oriented. Cranial nerves II-XII intact. Coordination normal. +DTRs bilaterally.      Assessment & Plan:   Rectal Bleeding, most likely due to anal fissures, no additional workup required:  Continue to take stool softener as recommend by ER Continue to monitor stools for blood.   RTC as needed or if symptoms persist

## 2012-06-29 ENCOUNTER — Other Ambulatory Visit (HOSPITAL_COMMUNITY): Payer: Self-pay | Admitting: Obstetrics and Gynecology

## 2012-06-29 DIAGNOSIS — Z1231 Encounter for screening mammogram for malignant neoplasm of breast: Secondary | ICD-10-CM

## 2012-07-18 ENCOUNTER — Ambulatory Visit (HOSPITAL_COMMUNITY): Payer: Medicare Other

## 2012-08-04 ENCOUNTER — Ambulatory Visit (HOSPITAL_COMMUNITY)
Admission: RE | Admit: 2012-08-04 | Discharge: 2012-08-04 | Disposition: A | Discharge status: Home / Self Care | Payer: Medicare Other | Source: Ambulatory Visit | Attending: Obstetrics and Gynecology | Admitting: Obstetrics and Gynecology

## 2012-08-04 DIAGNOSIS — Z1231 Encounter for screening mammogram for malignant neoplasm of breast: Secondary | ICD-10-CM

## 2012-08-04 LAB — HM MAMMOGRAPHY

## 2012-11-01 ENCOUNTER — Encounter: Payer: Self-pay | Admitting: Gastroenterology

## 2012-12-20 ENCOUNTER — Other Ambulatory Visit: Payer: Self-pay | Admitting: Internal Medicine

## 2013-01-03 ENCOUNTER — Other Ambulatory Visit: Payer: Self-pay | Admitting: Internal Medicine

## 2013-01-06 ENCOUNTER — Encounter: Payer: Self-pay | Admitting: Internal Medicine

## 2013-01-06 ENCOUNTER — Ambulatory Visit (INDEPENDENT_AMBULATORY_CARE_PROVIDER_SITE_OTHER): Payer: Medicare Other | Admitting: Internal Medicine

## 2013-01-06 ENCOUNTER — Other Ambulatory Visit: Payer: Self-pay | Admitting: Internal Medicine

## 2013-01-06 ENCOUNTER — Other Ambulatory Visit (INDEPENDENT_AMBULATORY_CARE_PROVIDER_SITE_OTHER): Payer: Medicare Other

## 2013-01-06 VITALS — BP 130/80 | HR 98 | Temp 98.2°F | Ht 59.0 in | Wt 196.4 lb

## 2013-01-06 DIAGNOSIS — Z0000 Encounter for general adult medical examination without abnormal findings: Secondary | ICD-10-CM

## 2013-01-06 DIAGNOSIS — E039 Hypothyroidism, unspecified: Secondary | ICD-10-CM

## 2013-01-06 DIAGNOSIS — Z136 Encounter for screening for cardiovascular disorders: Secondary | ICD-10-CM

## 2013-01-06 LAB — LIPID PANEL
LDL Cholesterol: 106 mg/dL — ABNORMAL HIGH (ref 0–99)
Total CHOL/HDL Ratio: 3
Triglycerides: 81 mg/dL (ref 0.0–149.0)

## 2013-01-06 LAB — URINALYSIS, ROUTINE W REFLEX MICROSCOPIC
Leukocytes, UA: NEGATIVE
Specific Gravity, Urine: 1.025 (ref 1.000–1.030)
Urobilinogen, UA: 0.2 (ref 0.0–1.0)

## 2013-01-06 LAB — CBC WITH DIFFERENTIAL/PLATELET
Basophils Absolute: 0 10*3/uL (ref 0.0–0.1)
Eosinophils Absolute: 0.1 10*3/uL (ref 0.0–0.7)
Lymphocytes Relative: 19.9 % (ref 12.0–46.0)
MCHC: 33.5 g/dL (ref 30.0–36.0)
Neutro Abs: 3.2 10*3/uL (ref 1.4–7.7)
Neutrophils Relative %: 69.8 % (ref 43.0–77.0)
Platelets: 250 10*3/uL (ref 150.0–400.0)
RDW: 13.8 % (ref 11.5–14.6)

## 2013-01-06 LAB — HEPATIC FUNCTION PANEL
Albumin: 4.2 g/dL (ref 3.5–5.2)
Alkaline Phosphatase: 59 U/L (ref 39–117)
Bilirubin, Direct: 0.1 mg/dL (ref 0.0–0.3)

## 2013-01-06 LAB — BASIC METABOLIC PANEL
CO2: 27 mEq/L (ref 19–32)
Chloride: 106 mEq/L (ref 96–112)
Creatinine, Ser: 0.6 mg/dL (ref 0.4–1.2)

## 2013-01-06 MED ORDER — LEVOTHYROXINE SODIUM 75 MCG PO TABS: 75.0000 ug | ORAL_TABLET | Freq: Every day | ORAL | Status: DC

## 2013-01-06 MED ORDER — ALENDRONATE SODIUM 70 MG PO TABS: 70.0000 mg | ORAL_TABLET | ORAL | Status: DC

## 2013-01-06 NOTE — Progress Notes (Signed)
Subjective:    Patient ID: [name], female    DOB: [birth year], 67 y.o.   MRN: [id number]  HPI  Here for wellness and f/u;  Overall doing ok;  Pt denies CP, worsening SOB, DOE, wheezing, orthopnea, PND, worsening LE edema, palpitations, dizziness or syncope.  Pt denies neurological change such as new headache, facial or extremity weakness.  Pt denies polydipsia, polyuria, or low sugar symptoms. Pt states overall good compliance with treatment and medications, good tolerability, and has been trying to follow lower cholesterol diet.  Pt denies worsening depressive symptoms, suicidal ideation or panic. No fever, night sweats, wt loss, loss of appetite, or other constitutional symptoms.  Pt states good ability with ADL's, has low fall risk, home safety reviewed and adequate, no other significant changes in hearing or vision, and only occasionally active with exercise.  Declines flu shot.  No acute complaints Past Medical History  Diagnosis Date  . HYPOTHYROIDISM 11/25/2006  . HYPERCHOLESTEROLEMIA 11/25/2006  . HYPERLIPIDEMIA 11/18/2009  . OBESITY 12/04/2006  . DEPRESSION 11/25/2006  . OTITIS MEDIA, ACUTE, LEFT 11/18/2009  . HYPERTENSION 07/15/2007  . PULMONARY NODULE 11/25/2006  . DIVERTICULOSIS, COLON 12/04/2006  . POSTMENOPAUSAL STATUS 11/25/2006  . DEGENERATIVE JOINT DISEASE 11/25/2006  . OSTEOPENIA 11/25/2006  . DIZZINESS 11/18/2009  . PALPITATIONS, RECURRENT 07/20/2008  . HYPERPARATHYROIDISM, HX OF 11/25/2006  . Vitamin D deficiency   . DJD (degenerative joint disease)     Bilateral Hip  . Allergic rhinitis, cause unspecified 12/26/2010   Past Surgical History  Procedure Laterality Date  . Left knee    . Right wrist    . Abdominal hysterectomy    . Total hip arthroplasty      left 3/05, right 2002    reports that she has never smoked. She does not have any smokeless tobacco history on file. She reports that she does not drink alcohol or use illicit drugs. family history includes  Hypertension in her other. No Known Allergies Current Outpatient Prescriptions on File Prior to Visit  Medication Sig Dispense Refill  . aspirin 81 MG EC tablet Take 81 mg by mouth every morning.       . cholecalciferol (VITAMIN D) 1000 UNITS tablet Take 1,000 Units by mouth daily.      Marland Kitchen lisinopril (PRINIVIL,ZESTRIL) 5 MG tablet take 1 tablet by mouth once daily  90 tablet  0  . lovastatin (MEVACOR) 20 MG tablet take 1 tablet by mouth once daily  90 tablet  0  . psyllium (HYDROCIL/METAMUCIL) 95 % PACK Take 1 packet by mouth daily as needed. For constipation      . fexofenadine (ALLEGRA) 180 MG tablet Take 1 tablet (180 mg total) by mouth daily.  30 tablet  2   No current facility-administered medications on file prior to visit.   Review of Systems Constitutional: Negative for diaphoresis, activity change, appetite change or unexpected weight change.  HENT: Negative for hearing loss, ear pain, facial swelling, mouth sores and neck stiffness.   Eyes: Negative for pain, redness and visual disturbance.  Respiratory: Negative for shortness of breath and wheezing.   Cardiovascular: Negative for chest pain and palpitations.  Gastrointestinal: Negative for diarrhea, blood in stool, abdominal distention or other pain Genitourinary: Negative for hematuria, flank pain or change in urine volume.  Musculoskeletal: Negative for myalgias and joint swelling.  Skin: Negative for color change and wound.  Neurological: Negative for syncope and numbness. other than noted Hematological: Negative for adenopathy.  Psychiatric/Behavioral: Negative for hallucinations, self-injury, decreased  concentration and agitation.      Objective:   Physical Exam BP 130/80  Pulse 98  Temp(Src) 98.2 F (36.8 C) (Oral)  Ht 4\' 11"  (1.499 m)  Wt 196 lb 6 oz (89.075 kg)  BMI 39.64 kg/m2  SpO2 96% VS noted,  Constitutional: Pt is oriented to person, place, and time. Appears well-developed and well-nourished.  Head:  Normocephalic and atraumatic.  Right Ear: External ear normal.  Left Ear: External ear normal.  Nose: Nose normal.  Mouth/Throat: Oropharynx is clear and moist.  Eyes: Conjunctivae and EOM are normal. Pupils are equal, round, and reactive to light.  Neck: Normal range of motion. Neck supple. No JVD present. No tracheal deviation present.  Cardiovascular: Normal rate, regular rhythm, normal heart sounds and intact distal pulses.   Pulmonary/Chest: Effort normal and breath sounds normal.  Abdominal: Soft. Bowel sounds are normal. There is no tenderness. No HSM  Musculoskeletal: Normal range of motion. Exhibits no edema.  Lymphadenopathy:  Has no cervical adenopathy.  Neurological: Pt is alert and oriented to person, place, and time. Pt has normal reflexes. No cranial nerve deficit.  Skin: Skin is warm and dry. No rash noted.  Psychiatric:  Has  normal mood and affect. Behavior is normal.     Assessment & Plan:

## 2013-01-06 NOTE — Patient Instructions (Addendum)
Please continue all other medications as before, and refills have been done if requested. Please have the pharmacy call with any other refills you may need. Please continue your efforts at being more active, low cholesterol diet, and weight control. You will be contacted regarding the referral for: colonoscopy Please return if you change your mind about the shots Please continue your efforts at being more active, low cholesterol diet, and weight control. Please go to the LAB in the Basement (turn left off the elevator) for the tests to be done today You will be contacted by phone if any changes need to be made immediately.  Otherwise, you will receive a letter about your results with an explanation, but please check with MyChart first.  Please remember to sign up for My Chart if you have not done so, as this will be important to you in the future with finding out test results, communicating by private email, and scheduling acute appointments online when needed.  Please return in 6 months, or sooner if needed

## 2013-01-06 NOTE — Progress Notes (Signed)
Pre-visit discussion using our clinic review tool. No additional management support is needed unless otherwise documented below in the visit note.  

## 2013-01-07 NOTE — Assessment & Plan Note (Signed)

## 2013-01-24 ENCOUNTER — Encounter: Payer: Self-pay | Admitting: Internal Medicine

## 2013-03-15 ENCOUNTER — Other Ambulatory Visit: Payer: Self-pay

## 2013-03-15 MED ORDER — LEVOTHYROXINE SODIUM 75 MCG PO TABS: 75.0000 ug | ORAL_TABLET | Freq: Every day | ORAL | Status: DC

## 2013-03-15 MED ORDER — LOVASTATIN 20 MG PO TABS: 20.0000 mg | ORAL_TABLET | Freq: Every day | ORAL | Status: DC

## 2013-03-15 MED ORDER — ALENDRONATE SODIUM 70 MG PO TABS: 70.0000 mg | ORAL_TABLET | ORAL | Status: DC

## 2013-03-15 MED ORDER — LISINOPRIL 5 MG PO TABS: 5.0000 mg | ORAL_TABLET | Freq: Every day | ORAL | Status: DC

## 2013-03-17 ENCOUNTER — Ambulatory Visit (AMBULATORY_SURGERY_CENTER): Payer: Self-pay | Admitting: *Deleted

## 2013-03-17 ENCOUNTER — Telehealth: Payer: Self-pay | Admitting: Internal Medicine

## 2013-03-17 VITALS — Ht 59.0 in | Wt 198.4 lb

## 2013-03-17 DIAGNOSIS — Z86010 Personal history of colonic polyps: Secondary | ICD-10-CM

## 2013-03-17 MED ORDER — MOVIPREP 100 G PO SOLR: 1.0000 | Freq: Once | ORAL | Status: DC

## 2013-03-17 NOTE — Telephone Encounter (Signed)
03/17/2013  Pt has switched to [facility] mail order and wants to know if the scripts that were faxed here have been signed and sent back.

## 2013-03-17 NOTE — Progress Notes (Signed)
No egg or soy allergy. ewm No cpap or home 02 use. ewm No problems with past sedation. ewm Pt doesnt have e mail so no emmi video. ewm,

## 2013-03-17 NOTE — Telephone Encounter (Signed)
Patient informed scripts have been sent in as requested.

## 2013-03-31 ENCOUNTER — Encounter: Payer: Self-pay | Admitting: Internal Medicine

## 2013-03-31 ENCOUNTER — Ambulatory Visit (AMBULATORY_SURGERY_CENTER): Payer: Medicare HMO | Admitting: Internal Medicine

## 2013-03-31 VITALS — BP 123/73 | HR 83 | Temp 97.4°F | Resp 19 | Ht 59.0 in | Wt 198.0 lb

## 2013-03-31 DIAGNOSIS — Z1211 Encounter for screening for malignant neoplasm of colon: Secondary | ICD-10-CM

## 2013-03-31 DIAGNOSIS — Z86010 Personal history of colonic polyps: Secondary | ICD-10-CM

## 2013-03-31 MED ORDER — SODIUM CHLORIDE 0.9 % IV SOLN: 500.0000 mL | INTRAVENOUS | Status: DC

## 2013-03-31 NOTE — Patient Instructions (Signed)
FOLLOW DISCHARGE INSTRUCTIONS (BLUE AND GREEN SHEETS).YOU HAD AN ENDOSCOPIC PROCEDURE TODAY AT [facility]: Refer to the procedure report that was given to you for any specific questions about what was found during the examination.  If the procedure report does not answer your questions, please call your gastroenterologist to clarify.  If you requested that your care partner not be given the details of your procedure findings, then the procedure report has been included in a sealed envelope for you to review at your convenience later.  YOU SHOULD EXPECT: Some feelings of bloating in the abdomen. Passage of more gas than usual.  Walking can help get rid of the air that was put into your GI tract during the procedure and reduce the bloating. If you had a lower endoscopy (such as a colonoscopy or flexible sigmoidoscopy) you may notice spotting of blood in your stool or on the toilet paper. If you underwent a bowel prep for your procedure, then you may not have a normal bowel movement for a few days.  DIET: Your first meal following the procedure should be a light meal and then it is ok to progress to your normal diet.  A half-sandwich or bowl of soup is an example of a good first meal.  Heavy or fried foods are harder to digest and may make you feel nauseous or bloated.  Likewise meals heavy in dairy and vegetables can cause extra gas to form and this can also increase the bloating.  Drink plenty of fluids but you should avoid alcoholic beverages for 24 hours.  ACTIVITY: Your care partner should take you home directly after the procedure.  You should plan to take it easy, moving slowly for the rest of the day.  You can resume normal activity the day after the procedure however you should NOT DRIVE or use heavy machinery for 24 hours (because of the sedation medicines used during the test).    SYMPTOMS TO REPORT IMMEDIATELY: A gastroenterologist can be reached at any hour.  During normal  business hours, 8:30 AM to 5:00 PM Monday through [facility], call [phone].  After hours and on weekends, please call the GI answering service at [phone] who will take a message and have the physician on call contact you.   Following lower endoscopy (colonoscopy or flexible sigmoidoscopy):  Excessive amounts of blood in the stool  Significant tenderness or worsening of abdominal pains  Swelling of the abdomen that is new, acute  Fever of 100F or higher    FOLLOW UP: If any biopsies were taken you will be contacted by phone or by letter within the next 1-3 weeks.  Call your gastroenterologist if you have not heard about the biopsies in 3 weeks.  Our staff will call the home number listed on your records the next business day following your procedure to check on you and address any questions or concerns that you may have at that time regarding the information given to you following your procedure. This is a courtesy call and so if there is no answer at the home number and we have not heard from you through the emergency physician on call, we will assume that you have returned to your regular daily activities without incident.  SIGNATURES/CONFIDENTIALITY: You and/or your care partner have signed paperwork which will be entered into your electronic medical record.  These signatures attest to the fact that that the information above on your After Visit Summary has been reviewed and is understood.  Full responsibility of the confidentiality of this discharge information lies with you and/or your care-partner.   Diverticulosis and high fiber information given.  Benefiber 1 teaspoon daily  Recall colonoscopy 10 years-2025.

## 2013-03-31 NOTE — Progress Notes (Signed)
Procedure ends, to recovery, report given and VSS. 

## 2013-03-31 NOTE — Op Note (Signed)
[facility] [address] [place], [id number]   COLONOSCOPY PROCEDURE REPORT  PATIENT: [name], Lori M.  MR#: [id number] BIRTHDATE: 1945-12-10 , 67  yrs. old GENDER: Female ENDOSCOPIST: Hart Carwinora M Breanna Mcdaniel, MD REFERRED XB:JYNWGBY:[name], [name] PROCEDURE DATE:  03/31/2013 PROCEDURE:   Colonoscopy, screening First Screening Colonoscopy - Avg.  risk and is 50 yrs.  old or older - No.  Prior Negative Screening - Now for repeat screening. Other: See Comments  History of Adenoma - Now for follow-up colonoscopy & has been > or = to 3 yrs.  N/A  Polyps Removed Today? No.  Recommend repeat exam, <10 yrs? No. ASA CLASS:   Class II INDICATIONS:upper plastic polyp in 2002.  Last colonoscopy in October 2007,, diverticulosis.  Ulcer at the ileocecal valve. MEDICATIONS: MAC sedation, administered by CRNA and Propofol (Diprivan) 170 mg IV  DESCRIPTION OF PROCEDURE:   After the risks benefits and alternatives of the procedure were thoroughly explained, informed consent was obtained.  A digital rectal exam revealed no abnormalities of the rectum.   The LB [id number]-[id number]  endoscope was introduced through the anus and advanced to the cecum, which was identified by both the appendix and ileocecal valve. No adverse events experienced.   The quality of the prep was good, using MoviPrep  The instrument was then slowly withdrawn as the colon was fully examined.      COLON FINDINGS: There was severe diverticulosis noted in the descending colon with associated muscular hypertrophy, tortuosity and colonic narrowing.  Retroflexed views revealed no abnormalities. The time to cecum=6 minutes 28 seconds.  Withdrawal time=6 minutes 24 seconds.  The scope was withdrawn and the procedure completed. COMPLICATIONS: There were no complications.  ENDOSCOPIC IMPRESSION: There was severe diverticulosis noted in the descending colon ,narrow lumen and a hypertrophied fold, no evidence  of diverticulitis  RECOMMENDATIONS: high-fiber diet Benefiber 1 teaspoon daily Recall colonoscopy in 10 years   eSigned:  Hart Carwinora M Larayah Clute, MD 03/31/2013 10:22 AM   cc:   PATIENT NAME:  [name], [name] MR#: [id number]

## 2013-03-31 NOTE — Progress Notes (Signed)
No egg or soy allergy. ewm No problems with past sedation. ewm 

## 2013-04-03 ENCOUNTER — Telehealth: Payer: Self-pay | Admitting: *Deleted

## 2013-04-03 NOTE — Telephone Encounter (Signed)
  Follow up Call-  Call back number 03/31/2013  Post procedure Call Back phone  # [phone]  Permission to leave phone message Yes     Patient questions:  Do you have a fever, pain , or abdominal swelling? no Pain Score  0 *  Have you tolerated food without any problems? yes  Have you been able to return to your normal activities? yes  Do you have any questions about your discharge instructions: Diet   no Medications  no Follow up visit  no  Do you have questions or concerns about your Care? no  Actions: * If pain score is 4 or above: No action needed, pain <4.

## 2013-07-06 ENCOUNTER — Encounter: Payer: Self-pay | Admitting: Internal Medicine

## 2013-07-06 ENCOUNTER — Ambulatory Visit (INDEPENDENT_AMBULATORY_CARE_PROVIDER_SITE_OTHER): Payer: Medicare HMO | Admitting: Internal Medicine

## 2013-07-06 VITALS — BP 118/72 | HR 93 | Temp 98.1°F | Ht 59.0 in | Wt 196.1 lb

## 2013-07-06 DIAGNOSIS — Z0000 Encounter for general adult medical examination without abnormal findings: Secondary | ICD-10-CM

## 2013-07-06 DIAGNOSIS — I10 Essential (primary) hypertension: Secondary | ICD-10-CM

## 2013-07-06 DIAGNOSIS — E039 Hypothyroidism, unspecified: Secondary | ICD-10-CM

## 2013-07-06 DIAGNOSIS — F329 Major depressive disorder, single episode, unspecified: Secondary | ICD-10-CM

## 2013-07-06 DIAGNOSIS — F3289 Other specified depressive episodes: Secondary | ICD-10-CM

## 2013-07-06 DIAGNOSIS — E785 Hyperlipidemia, unspecified: Secondary | ICD-10-CM

## 2013-07-06 NOTE — Progress Notes (Signed)
 Subjective:    Patient ID: [name], female    DOB: 02/01/1946, 67 y.o.   MRN: [id number]  HPI  Here to f/u; overall doing ok,  Pt denies chest pain, increased sob or doe, wheezing, orthopnea, PND, increased LE swelling, palpitations, dizziness or syncope.  Pt denies polydipsia, polyuria, or low sugar symptoms such as weakness or confusion improved with po intake.  Pt denies new neurological symptoms such as new headache, or facial or extremity weakness or numbness.   Pt states overall good compliance with meds, has been trying to follow lower cholesterol diet, with wt overall stable,  but little exercise however, as she has one leg shorter than the other after bilat hip surgury, needs to walk with cane for balance.  No recent falls. Feels she is getting shorter as well but no spine pain. Denies hyper or hypo thyroid symptoms such as voice, skin or hair change. Denies worsening depressive symptoms, suicidal ideation, or panic Past Medical History  Diagnosis Date  . HYPOTHYROIDISM 11/25/2006  . HYPERCHOLESTEROLEMIA 11/25/2006  . HYPERLIPIDEMIA 11/18/2009  . OBESITY 12/04/2006  . DEPRESSION 11/25/2006  . OTITIS MEDIA, ACUTE, LEFT 11/18/2009  . HYPERTENSION 07/15/2007  . PULMONARY NODULE 11/25/2006  . DIVERTICULOSIS, COLON 12/04/2006  . POSTMENOPAUSAL STATUS 11/25/2006  . DEGENERATIVE JOINT DISEASE 11/25/2006  . OSTEOPENIA 11/25/2006  . DIZZINESS 11/18/2009  . PALPITATIONS, RECURRENT 07/20/2008  . HYPERPARATHYROIDISM, HX OF 11/25/2006  . Vitamin D deficiency   . DJD (degenerative joint disease)     Bilateral Hip  . Allergic rhinitis, cause unspecified 12/26/2010  . Cataract     1 eye-? which   Past Surgical History  Procedure Laterality Date  . Left knee    . Right wrist    . Abdominal hysterectomy    . Total hip arthroplasty      left 3/05, right 2002  . Colonoscopy    . Polypectomy    . Thyroid surgery      early 70's  . Umbilical hernia repair      reports that she has quit  smoking. She has quit using smokeless tobacco. She reports that she does not drink alcohol or use illicit drugs. family history includes Breast cancer in her mother; Hypertension in her other; Stroke in her father. There is no history of Colon cancer, Esophageal cancer, Rectal cancer, or Stomach cancer. No Known Allergies Current Outpatient Prescriptions on File Prior to Visit  Medication Sig Dispense Refill  . alendronate (FOSAMAX) 70 MG tablet Take 1 tablet (70 mg total) by mouth once a week. Take with a full glass of water on an empty stomach.  12 tablet  3  . aspirin 81 MG EC tablet Take 81 mg by mouth every morning.       . cholecalciferol (VITAMIN D) 1000 UNITS tablet Take 1,000 Units by mouth daily.      . levothyroxine (SYNTHROID, LEVOTHROID) 75 MCG tablet Take 1 tablet (75 mcg total) by mouth daily.  90 tablet  3  . lisinopril (PRINIVIL,ZESTRIL) 5 MG tablet Take 1 tablet (5 mg total) by mouth daily.  90 tablet  3  . lovastatin (MEVACOR) 20 MG tablet Take 1 tablet (20 mg total) by mouth daily.  90 tablet  3  . MOVIPREP 100 G SOLR Take 1 kit (200 g total) by mouth once. moviprep as directed. No substitutions  1 kit  0  . Naproxen Sodium (ALEVE) 220 MG CAPS Take 2 capsules by mouth daily as needed.      .   psyllium (HYDROCIL/METAMUCIL) 95 % PACK Take 1 packet by mouth daily as needed. For constipation       No current facility-administered medications on file prior to visit.     Review of Systems  Constitutional: Negative for unusual diaphoresis or other sweats  HENT: Negative for ringing in ear Eyes: Negative for double vision or worsening visual disturbance.  Respiratory: Negative for choking and stridor.   Gastrointestinal: Negative for vomiting or other signifcant bowel change Genitourinary: Negative for hematuria or decreased urine volume.  Musculoskeletal: Negative for other MSK pain or swelling Skin: Negative for color change and worsening wound.  Neurological: Negative for  tremors and numbness other than noted  Psychiatric/Behavioral: Negative for decreased concentration or agitation other than above  '     Objective:   Physical Exam BP 118/72  Pulse 93  Temp(Src) 98.1 F (36.7 C) (Oral)  Ht 4' 11" (1.499 m)  Wt 196 lb 2 oz (88.962 kg)  BMI 39.59 kg/m2  SpO2 97% VS noted,  Constitutional: Pt appears well-developed, well-nourished.  HENT: Head: NCAT.  Right Ear: External ear normal.  Left Ear: External ear normal.  Eyes: . Pupils are equal, round, and reactive to light. Conjunctivae and EOM are normal Neck: Normal range of motion. Neck supple.  Cardiovascular: Normal rate and regular rhythm.   Pulmonary/Chest: Effort normal and breath sounds normal.  Abd:  Soft, NT, ND, + BS Neurological: Pt is alert. Not confused , motor grossly intact Skin: Skin is warm. No rash Has some mild upper lumbar kyphosis Psychiatric: Pt behavior is normal. No agitation.     Assessment & Plan:   

## 2013-07-06 NOTE — Assessment & Plan Note (Signed)
stable overall by history and exam, recent data reviewed with pt, and pt to continue medical treatment as before,  to f/u any worsening symptoms or concerns Lab Results  Component Value Date   LDLCALC 106* 01/06/2013   To cont low chol diet, and current statin

## 2013-07-06 NOTE — Progress Notes (Signed)
Pre visit review using our clinic review tool, if applicable. No additional management support is needed unless otherwise documented below in the visit note. 

## 2013-07-06 NOTE — Patient Instructions (Signed)
Please continue all other medications as before, and refills have been done if requested. Please have the pharmacy call with any other refills you may need.  Please continue your efforts at being more active, low cholesterol diet, and weight control.  You are otherwise up to date with prevention measures today.  Please keep your appointments with your specialists as you may have planned  No further lab testing needed today  Please return in 6 months, or sooner if needed, with Lab testing done 3-5 days before

## 2013-07-06 NOTE — Assessment & Plan Note (Signed)
stable overall by history and exam, recent data reviewed with pt, and pt to continue medical treatment as before,  to f/u any worsening symptoms or concerns BP Readings from Last 3 Encounters:  07/06/13 118/72  03/31/13 123/73  01/06/13 130/80

## 2013-07-06 NOTE — Assessment & Plan Note (Signed)
Resolved, stable overall by history and exam, recent data reviewed with pt, and pt to continue medical treatment as before,  to f/u any worsening symptoms or concerns Lab Results  Component Value Date   WBC 4.6 01/06/2013   HGB 13.4 01/06/2013   HCT 39.9 01/06/2013   PLT 250.0 01/06/2013   GLUCOSE 84 01/06/2013   CHOL 189 01/06/2013   TRIG 81.0 01/06/2013   HDL 67.30 01/06/2013   LDLDIRECT 118.5 01/01/2012   LDLCALC 106* 01/06/2013   ALT 12 01/06/2013   AST 16 01/06/2013   NA 136 01/06/2013   K 4.4 01/06/2013   CL 106 01/06/2013   CREATININE 0.6 01/06/2013   BUN 13 01/06/2013   CO2 27 01/06/2013   TSH 1.88 01/06/2013

## 2013-07-06 NOTE — Assessment & Plan Note (Signed)
stable overall by history and exam, recent data reviewed with pt, and pt to continue medical treatment as before,  to f/u any worsening symptoms or concerns Lab Results  Component Value Date   TSH 1.88 01/06/2013

## 2013-07-07 ENCOUNTER — Telehealth: Payer: Self-pay | Admitting: Internal Medicine

## 2013-07-07 NOTE — Telephone Encounter (Signed)
Relevant patient education mailed to patient.  

## 2013-07-14 ENCOUNTER — Other Ambulatory Visit (HOSPITAL_COMMUNITY): Payer: Self-pay | Admitting: Obstetrics and Gynecology

## 2013-07-14 DIAGNOSIS — Z1231 Encounter for screening mammogram for malignant neoplasm of breast: Secondary | ICD-10-CM

## 2013-07-31 LAB — HM MAMMOGRAPHY

## 2013-08-07 ENCOUNTER — Ambulatory Visit (HOSPITAL_COMMUNITY)
Admission: RE | Admit: 2013-08-07 | Discharge: 2013-08-07 | Disposition: A | Discharge status: Home / Self Care | Payer: Medicare HMO | Source: Ambulatory Visit | Attending: Obstetrics and Gynecology | Admitting: Obstetrics and Gynecology

## 2013-08-07 DIAGNOSIS — Z1231 Encounter for screening mammogram for malignant neoplasm of breast: Secondary | ICD-10-CM

## 2013-12-27 ENCOUNTER — Encounter: Payer: Self-pay | Admitting: Internal Medicine

## 2013-12-27 ENCOUNTER — Other Ambulatory Visit (INDEPENDENT_AMBULATORY_CARE_PROVIDER_SITE_OTHER): Payer: Medicare HMO

## 2013-12-27 ENCOUNTER — Ambulatory Visit (INDEPENDENT_AMBULATORY_CARE_PROVIDER_SITE_OTHER): Payer: Medicare HMO | Admitting: Internal Medicine

## 2013-12-27 VITALS — BP 128/78 | HR 103 | Temp 98.6°F | Ht <= 58 in | Wt 192.5 lb

## 2013-12-27 DIAGNOSIS — Z0000 Encounter for general adult medical examination without abnormal findings: Secondary | ICD-10-CM

## 2013-12-27 LAB — HEPATIC FUNCTION PANEL
ALT: 26 U/L (ref 0–35)
AST: 20 U/L (ref 0–37)
Albumin: 3.4 g/dL — ABNORMAL LOW (ref 3.5–5.2)
Alkaline Phosphatase: 66 U/L (ref 39–117)
Bilirubin, Direct: 0.1 mg/dL (ref 0.0–0.3)
Total Bilirubin: 0.6 mg/dL (ref 0.2–1.2)
Total Protein: 7.9 g/dL (ref 6.0–8.3)

## 2013-12-27 LAB — CBC WITH DIFFERENTIAL/PLATELET
Basophils Absolute: 0 10*3/uL (ref 0.0–0.1)
Basophils Relative: 0.6 % (ref 0.0–3.0)
Eosinophils Absolute: 0.1 10*3/uL (ref 0.0–0.7)
Eosinophils Relative: 2.7 % (ref 0.0–5.0)
HCT: 40.8 % (ref 36.0–46.0)
Hemoglobin: 13.3 g/dL (ref 12.0–15.0)
LYMPHS PCT: 20.9 % (ref 12.0–46.0)
Lymphs Abs: 1 10*3/uL (ref 0.7–4.0)
MCHC: 32.7 g/dL (ref 30.0–36.0)
MCV: 88.2 fl (ref 78.0–100.0)
MONOS PCT: 8.3 % (ref 3.0–12.0)
Monocytes Absolute: 0.4 10*3/uL (ref 0.1–1.0)
NEUTROS PCT: 67.5 % (ref 43.0–77.0)
Neutro Abs: 3.2 10*3/uL (ref 1.4–7.7)
PLATELETS: 400 10*3/uL (ref 150.0–400.0)
RBC: 4.62 Mil/uL (ref 3.87–5.11)
RDW: 13.4 % (ref 11.5–15.5)
WBC: 4.8 10*3/uL (ref 4.0–10.5)

## 2013-12-27 LAB — LIPID PANEL
CHOL/HDL RATIO: 4
CHOLESTEROL: 175 mg/dL (ref 0–200)
HDL: 43.9 mg/dL (ref >39.00)
LDL CALC: 117 mg/dL — AB (ref 0–99)
NonHDL: 131.1
Triglycerides: 72 mg/dL (ref 0.0–149.0)
VLDL: 14.4 mg/dL (ref 0.0–40.0)

## 2013-12-27 LAB — BASIC METABOLIC PANEL
BUN: 13 mg/dL (ref 6–23)
CHLORIDE: 105 meq/L (ref 96–112)
CO2: 27 mEq/L (ref 19–32)
CREATININE: 0.8 mg/dL (ref 0.4–1.2)
Calcium: 11.6 mg/dL — ABNORMAL HIGH (ref 8.4–10.5)
GFR: 93.81 mL/min (ref >60.00)
GLUCOSE: 96 mg/dL (ref 70–99)
POTASSIUM: 4.4 meq/L (ref 3.5–5.1)
Sodium: 138 mEq/L (ref 135–145)

## 2013-12-27 LAB — URINALYSIS, ROUTINE W REFLEX MICROSCOPIC
BILIRUBIN URINE: NEGATIVE
Hgb urine dipstick: NEGATIVE
Ketones, ur: NEGATIVE
LEUKOCYTES UA: NEGATIVE
NITRITE: NEGATIVE
PH: 6.5 (ref 5.0–8.0)
SPECIFIC GRAVITY, URINE: 1.015 (ref 1.000–1.030)
Total Protein, Urine: NEGATIVE
Urine Glucose: NEGATIVE
Urobilinogen, UA: 1 (ref 0.0–1.0)

## 2013-12-27 LAB — TSH: TSH: 1.44 u[IU]/mL (ref 0.35–4.50)

## 2013-12-27 NOTE — Progress Notes (Signed)
Pre visit review using our clinic review tool, if applicable. No additional management support is needed unless otherwise documented below in the visit note. 

## 2013-12-27 NOTE — Progress Notes (Signed)
Subjective:    Patient ID: [name], female    DOB: 10/18/1945, 68 y.o.   MRN: [id number]  HPI  Here for wellness and f/u;  Overall doing ok;  Pt denies CP, worsening SOB, DOE, wheezing, orthopnea, PND, worsening LE edema, palpitations, dizziness or syncope.  Pt denies neurological change such as new headache, facial or extremity weakness.  Pt denies polydipsia, polyuria, or low sugar symptoms. Pt states overall good compliance with treatment and medications, good tolerability, and has been trying to follow lower cholesterol diet.  Pt denies worsening depressive symptoms, suicidal ideation or panic. No fever, night sweats, wt loss, loss of appetite, or other constitutional symptoms.  Pt states good ability with ADL's, has low fall risk, home safety reviewed and adequate, no other significant changes in hearing or vision, and only occasionally active with exercise. No current complaints Past Medical History  Diagnosis Date  . HYPOTHYROIDISM 11/25/2006  . HYPERCHOLESTEROLEMIA 11/25/2006  . HYPERLIPIDEMIA 11/18/2009  . OBESITY 12/04/2006  . DEPRESSION 11/25/2006  . OTITIS MEDIA, ACUTE, LEFT 11/18/2009  . HYPERTENSION 07/15/2007  . PULMONARY NODULE 11/25/2006  . DIVERTICULOSIS, COLON 12/04/2006  . POSTMENOPAUSAL STATUS 11/25/2006  . DEGENERATIVE JOINT DISEASE 11/25/2006  . OSTEOPENIA 11/25/2006  . DIZZINESS 11/18/2009  . PALPITATIONS, RECURRENT 07/20/2008  . HYPERPARATHYROIDISM, HX OF 11/25/2006  . Vitamin D deficiency   . DJD (degenerative joint disease)     Bilateral Hip  . Allergic rhinitis, cause unspecified 12/26/2010  . Cataract     1 eye-? which   Past Surgical History  Procedure Laterality Date  . Left knee    . Right wrist    . Abdominal hysterectomy    . Total hip arthroplasty      left 3/05, right 2002  . Colonoscopy    . Polypectomy    . Thyroid surgery      early 70's  . Umbilical hernia repair      reports that she has quit smoking. She has quit using smokeless tobacco.  She reports that she does not drink alcohol or use illicit drugs. family history includes Breast cancer in her mother; Hypertension in her other; Stroke in her father. There is no history of Colon cancer, Esophageal cancer, Rectal cancer, or Stomach cancer. No Known Allergies Current Outpatient Prescriptions on File Prior to Visit  Medication Sig Dispense Refill  . alendronate (FOSAMAX) 70 MG tablet Take 1 tablet (70 mg total) by mouth once a week. Take with a full glass of water on an empty stomach. 12 tablet 3  . aspirin 81 MG EC tablet Take 81 mg by mouth every morning.     . cholecalciferol (VITAMIN D) 1000 UNITS tablet Take 1,000 Units by mouth daily.    Marland Kitchen. levothyroxine (SYNTHROID, LEVOTHROID) 75 MCG tablet Take 1 tablet (75 mcg total) by mouth daily. 90 tablet 3  . lisinopril (PRINIVIL,ZESTRIL) 5 MG tablet Take 1 tablet (5 mg total) by mouth daily. 90 tablet 3  . lovastatin (MEVACOR) 20 MG tablet Take 1 tablet (20 mg total) by mouth daily. 90 tablet 3  . Naproxen Sodium (ALEVE) 220 MG CAPS Take 2 capsules by mouth daily as needed.    . psyllium (HYDROCIL/METAMUCIL) 95 % PACK Take 1 packet by mouth daily as needed. For constipation     No current facility-administered medications on file prior to visit.   Review of Systems Constitutional: Negative for increased diaphoresis, other activity, appetite or other siginficant weight change  HENT: Negative for worsening hearing loss,  ear pain, facial swelling, mouth sores and neck stiffness.   Eyes: Negative for other worsening pain, redness or visual disturbance.  Respiratory: Negative for shortness of breath and wheezing.   Cardiovascular: Negative for chest pain and palpitations.  Gastrointestinal: Negative for diarrhea, blood in stool, abdominal distention or other pain Genitourinary: Negative for hematuria, flank pain or change in urine volume.  Musculoskeletal: Negative for myalgias or other joint complaints.  Skin: Negative for color  change and wound.  Neurological: Negative for syncope and numbness. other than noted Hematological: Negative for adenopathy. or other swelling Psychiatric/Behavioral: Negative for hallucinations, self-injury, decreased concentration or other worsening agitation.      Objective:   Physical Exam BP 128/78 mmHg  Pulse 103  Temp(Src) 98.6 F (37 C) (Oral)  Ht 4\' 9"  (1.448 m)  Wt 192 lb 8 oz (87.317 kg)  BMI 41.64 kg/m2  SpO2 96% VS noted,  Constitutional: Pt is oriented to person, place, and time. Appears well-developed and well-nourished.  Head: Normocephalic and atraumatic.  Right Ear: External ear normal.  Left Ear: External ear normal.  Nose: Nose normal.  Mouth/Throat: Oropharynx is clear and moist.  Eyes: Conjunctivae and EOM are normal. Pupils are equal, round, and reactive to light.  Neck: Normal range of motion. Neck supple. No JVD present. No tracheal deviation present.  Cardiovascular: Normal rate, regular rhythm, normal heart sounds and intact distal pulses.   Pulmonary/Chest: Effort normal and breath sounds without rales or wheezing  Abdominal: Soft. Bowel sounds are normal. NT. No HSM  Musculoskeletal: Normal range of motion. Exhibits no edema.  Lymphadenopathy:  Has no cervical adenopathy.  Neurological: Pt is alert and oriented to person, place, and time. Pt has normal reflexes. No cranial nerve deficit. Motor grossly intact Skin: Skin is warm and dry. No rash noted.  Psychiatric:  Has normal mood and affect. Behavior is normal.     Assessment & Plan:

## 2013-12-27 NOTE — Patient Instructions (Addendum)
Please consider the new Prevnar pneumonia shot  Please continue all other medications as before, and refills have been done if requested.  Please have the pharmacy call with any other refills you may need.  Please continue your efforts at being more active, low cholesterol diet, and weight control.  You are otherwise up to date with prevention measures today.  Please keep your appointments with your specialists as you may have planned  Please go to the LAB in the Basement (turn left off the elevator) for the tests to be done today  You will be contacted by phone if any changes need to be made immediately.  Otherwise, you will receive a letter about your results with an explanation, but please check with MyChart first.  Please remember to sign up for MyChart if you have not done so, as this will be important to you in the future with finding out test results, communicating by private email, and scheduling acute appointments online when needed.  Please return in 6 months, or sooner if needed

## 2013-12-28 ENCOUNTER — Other Ambulatory Visit: Payer: Medicare HMO

## 2013-12-28 ENCOUNTER — Encounter: Payer: Self-pay | Admitting: Internal Medicine

## 2013-12-28 ENCOUNTER — Other Ambulatory Visit: Payer: Self-pay | Admitting: Internal Medicine

## 2013-12-31 NOTE — Assessment & Plan Note (Signed)

## 2014-01-02 ENCOUNTER — Other Ambulatory Visit: Payer: Medicare HMO

## 2014-01-03 ENCOUNTER — Other Ambulatory Visit: Payer: Self-pay | Admitting: Internal Medicine

## 2014-01-03 ENCOUNTER — Telehealth: Payer: Self-pay | Admitting: Internal Medicine

## 2014-01-03 ENCOUNTER — Encounter: Payer: Self-pay | Admitting: Internal Medicine

## 2014-01-03 DIAGNOSIS — E213 Hyperparathyroidism, unspecified: Secondary | ICD-10-CM

## 2014-01-03 LAB — PTH, INTACT AND CALCIUM
Calcium: 11.4 mg/dL — ABNORMAL HIGH (ref 8.4–10.5)
PTH: 161 pg/mL — ABNORMAL HIGH (ref 14–64)

## 2014-01-03 NOTE — Telephone Encounter (Signed)
Called the patient informed of results and referral. 

## 2014-01-03 NOTE — Telephone Encounter (Signed)
Lab note today was in error  Robin to call pt - has elev PTH level (hyperparathyroidism) which can lead to other health problems like osteoporosis  We should refer her to [facility] for further eval and treatment

## 2014-01-03 NOTE — Telephone Encounter (Signed)
Called the patient no answer and no voice mail to leave message.

## 2014-01-04 ENCOUNTER — Ambulatory Visit: Payer: Medicare HMO | Admitting: Internal Medicine

## 2014-01-09 ENCOUNTER — Telehealth: Payer: Self-pay | Admitting: *Deleted

## 2014-01-09 NOTE — Telephone Encounter (Signed)
Pt states md referred her to see endocrinologist haven't received call concerning appt. Inform pt referral has been place will [facility] to [facility] her a call...Raechel Chute/lmb

## 2014-01-10 NOTE — Telephone Encounter (Signed)
Pt has appt for 01/25/14.

## 2014-01-25 ENCOUNTER — Ambulatory Visit: Payer: Medicare HMO | Admitting: Internal Medicine

## 2014-01-29 ENCOUNTER — Encounter: Payer: Self-pay | Admitting: Internal Medicine

## 2014-01-29 ENCOUNTER — Ambulatory Visit (INDEPENDENT_AMBULATORY_CARE_PROVIDER_SITE_OTHER): Payer: Medicare HMO | Admitting: Internal Medicine

## 2014-01-29 VITALS — BP 122/80 | HR 98 | Temp 99.1°F | Resp 12 | Ht 58.5 in | Wt 193.6 lb

## 2014-01-29 DIAGNOSIS — E213 Hyperparathyroidism, unspecified: Secondary | ICD-10-CM

## 2014-01-29 LAB — BASIC METABOLIC PANEL
BUN: 14 mg/dL (ref 6–23)
CO2: 25 meq/L (ref 19–32)
CREATININE: 0.8 mg/dL (ref 0.4–1.2)
Calcium: 10.7 mg/dL — ABNORMAL HIGH (ref 8.4–10.5)
Chloride: 110 mEq/L (ref 96–112)
GFR: 91.08 mL/min (ref >60.00)
Glucose, Bld: 90 mg/dL (ref 70–99)
POTASSIUM: 4.3 meq/L (ref 3.5–5.1)
SODIUM: 137 meq/L (ref 135–145)

## 2014-01-29 LAB — VITAMIN D 25 HYDROXY (VIT D DEFICIENCY, FRACTURES): VITD: 29.54 ng/mL — ABNORMAL LOW (ref 30.00–100.00)

## 2014-01-29 LAB — MAGNESIUM: MAGNESIUM: 2.1 mg/dL (ref 1.5–2.5)

## 2014-01-29 LAB — PHOSPHORUS: PHOSPHORUS: 2.9 mg/dL (ref 2.3–4.6)

## 2014-01-29 NOTE — Patient Instructions (Signed)
Please stop at the lab. I will let you know if you need to start the urine collection (if the vitamin D level is normal). Until then, continue 1000 units of vitamin D.  Patient information (Up-to-Date): Collection of a 24-hour urine specimen  - You should collect every drop of urine during each 24-hour period. It does not matter how much or little urine is passed each time, as long as every drop is collected. - Begin the urine collection in the morning after you wake up, after you have emptied your bladder for the first time. - Urinate (empty the bladder) for the first time and flush it down the toilet. Note the exact time (eg, 6:15 AM). You will begin the urine collection at this time. - Collect every drop of urine during the day and night in an empty collection bottle. Store the bottle at room temperature or in the refrigerator. - If you need to have a bowel movement, any urine passed with the bowel movement should be collected. Try not to include feces with the urine collection. If feces does get mixed in, do not try to remove the feces from the urine collection bottle. - Finish by collecting the first urine passed the next morning, adding it to the collection bottle. This should be within ten minutes before or after the time of the first morning void on the first day (which was flushed). In this example, you would try to void between 6:05 and 6:25 on the second day. - If you need to urinate one hour before the final collection time, drink a full glass of water so that you can void again at the appropriate time. If you have to urinate 20 minutes before, try to hold the urine until the proper time. - Please note the exact time of the final collection, even if it is not the same time as when collection began on day 1. - The bottle(s) may be kept at room temperature for a day or two, but should be kept cool or refrigerated for longer periods of time.  Please come back for a follow-up appointment in 4  months

## 2014-01-29 NOTE — Progress Notes (Addendum)
Patient ID: [name], female   DOB: 1945/11/14, 68 y.o.   MRN: [id number]   HPI  [name] is a 68 y.o.-year-old female, referred by her PCP, Dr.John, in consultation for hypercalcemia/hyperparathyroidism. She is here with her daughter who offers part of the history.  Pt was dx with hypercalcemia at least 2009. I reviewed pt's pertinent labs: Lab Results  Component Value Date   PTH 161* 01/02/2014   CALCIUM 11.4* 01/02/2014   CALCIUM 11.6* 12/27/2013   CALCIUM 10.8* 01/06/2013   CALCIUM 11.4* 02/18/2012   CALCIUM 11.3* 01/01/2012   CALCIUM 10.8* 12/22/2010   CALCIUM 10.6* 11/18/2009   CALCIUM 10.8* 07/20/2008   CALCIUM 10.6* 07/15/2007   I reviewed pt's DEXA scans: Date L1-L4 T score FN T score  03/13/2008 -1.2 Had 2 hip replacement: 2002 and 2005  No fractures. She fell Spring 2015.  On Fosamax weekly >> 2012, started by Dr. Cherly Hensen.  No h/o kidney stones. No AP, no depression.   No h/o CKD. Last BUN/Cr: Lab Results  Component Value Date   BUN 13 12/27/2013   CREATININE 0.8 12/27/2013   Pt is not on HCTZ.  No h/o vitamin D deficiency. On vitamin D 1000 IU daily.   Pt eats dairy (but not very often) and green, leafy, vegetables (lettuce).  Pt does not have a FH of hypercalcemia, kidney stones, pituitary tumors, thyroid cancer, or osteoporosis.   She also has a history of hypertension, hyperlipidemia, hypothyroidism. Last TSH was normal: : Lab Results  Component Value Date   TSH 1.44 12/27/2013   ROS: Constitutional: + weight loss, + decreased appetite, no fatigue, no subjective hyperthermia/hypothermia, + no area Eyes: + blurry vision, no xerophthalmia ENT: no sore throat, no nodules palpated in throat, no dysphagia/odynophagia, no hoarseness Cardiovascular: no CP/SOB/palpitations/leg swelling Respiratory: no cough/SOB Gastrointestinal: no N/V/D/C Musculoskeletal: + both: muscle/joint aches Skin: no rashes Neurological: no  tremors/numbness/tingling/dizziness, + HA Psychiatric: no depression/anxiety + low libido  Past Medical History  Diagnosis Date  . HYPOTHYROIDISM 11/25/2006  . HYPERCHOLESTEROLEMIA 11/25/2006  . HYPERLIPIDEMIA 11/18/2009  . OBESITY 12/04/2006  . DEPRESSION 11/25/2006  . OTITIS MEDIA, ACUTE, LEFT 11/18/2009  . HYPERTENSION 07/15/2007  . PULMONARY NODULE 11/25/2006  . DIVERTICULOSIS, COLON 12/04/2006  . POSTMENOPAUSAL STATUS 11/25/2006  . DEGENERATIVE JOINT DISEASE 11/25/2006  . OSTEOPENIA 11/25/2006  . DIZZINESS 11/18/2009  . PALPITATIONS, RECURRENT 07/20/2008  . HYPERPARATHYROIDISM, HX OF 11/25/2006  . Vitamin D deficiency   . DJD (degenerative joint disease)     Bilateral Hip  . Allergic rhinitis, cause unspecified 12/26/2010  . Cataract     1 eye-? which   Past Surgical History  Procedure Laterality Date  . Left knee    . Right wrist    . Abdominal hysterectomy    . Total hip arthroplasty      left 3/05, right 2002  . Colonoscopy    . Polypectomy    . Thyroid surgery      early 70's  . Umbilical hernia repair     History   Social History  . Marital Status: Married    Spouse Name: N/A    Number of Children: 4   Occupational History  . retired    Social History Main Topics  . Smoking status: Former Games developer  . Smokeless tobacco: Former Neurosurgeon     Comment: tried snuff as a child, casual smoker as young adult  . Alcohol Use: No  . Drug Use: No   Current Outpatient Prescriptions on  File Prior to Visit  Medication Sig Dispense Refill  . alendronate (FOSAMAX) 70 MG tablet Take 1 tablet (70 mg total) by mouth once a week. Take with a full glass of water on an empty stomach. 12 tablet 3  . aspirin 81 MG EC tablet Take 81 mg by mouth every morning.     . cholecalciferol (VITAMIN D) 1000 UNITS tablet Take 1,000 Units by mouth daily.    Marland Kitchen. levothyroxine (SYNTHROID, LEVOTHROID) 75 MCG tablet Take 1 tablet (75 mcg total) by mouth daily. 90 tablet 3  . lisinopril (PRINIVIL,ZESTRIL) 5  MG tablet Take 1 tablet (5 mg total) by mouth daily. 90 tablet 3  . lovastatin (MEVACOR) 20 MG tablet Take 1 tablet (20 mg total) by mouth daily. 90 tablet 3  . Naproxen Sodium (ALEVE) 220 MG CAPS Take 2 capsules by mouth daily as needed.    . psyllium (HYDROCIL/METAMUCIL) 95 % PACK Take 1 packet by mouth daily as needed. For constipation     No current facility-administered medications on file prior to visit.   No Known Allergies Family History  Problem Relation Age of Onset  . Hypertension Other   . Breast cancer Mother   . Colon cancer Neg Hx   . Esophageal cancer Neg Hx   . Rectal cancer Neg Hx   . Stomach cancer Neg Hx   . Stroke Father    PE: BP 122/80 mmHg  Pulse 98  Temp(Src) 99.1 F (37.3 C) (Oral)  Resp 12  Ht 4' 10.5" (1.486 m)  Wt 193 lb 9.6 oz (87.816 kg)  BMI 39.77 kg/m2  SpO2 97% Wt Readings from Last 3 Encounters:  01/29/14 193 lb 9.6 oz (87.816 kg)  12/27/13 192 lb 8 oz (87.317 kg)  07/06/13 196 lb 2 oz (88.962 kg)   Constitutional: overweight, in NAD. No kyphosis. Eyes: PERRLA, EOMI, no exophthalmos ENT: moist mucous membranes, no thyromegaly, no cervical lymphadenopathy Cardiovascular: Tachycardia, RR, No MRG Respiratory: CTA B Gastrointestinal: abdomen soft, NT, ND, BS+ Musculoskeletal: no deformities, strength intact in all 4 Skin: moist, warm, no rashes Neurological: no tremor with outstretched hands, DTR normal in all 4  Assessment: 1. Hypercalcemia/hyperparathyroidism  Plan: Patient has had several instances of elevated calcium, with the highest level being at 11.6. An intact PTH level was also high, at 161 (at which time, calcium was 11.4). It is unclear whether she has vitamin D deficiency, however, it is very likely that she does have a parathyroid adenoma based on the high PTH level with a borderline/high calcium.  No apparent complications from hypercalcemia: no h/o nephrolithiasis, no osteoporosis, no fractures. No abdominal pain,  depression, bone pain. - I discussed with the patient and her daughter about the physiology of calcium and parathyroid hormone, and possible side effects from increased PTH, including kidney stones, osteoporosis, abdominal pain, etc.  - We discussed that we need to check whether his hyperparathyroidism is primary (Familial hypercalcemic hypocalciuria or parathyroid adenoma) or secondary (to conditions like: vitamin D deficiency, calcium malabsorption, hypercalciuria, renal insufficiency, etc.). - we discussed about criteria for parathyroid surgery:  Increased calcium by more than 1 mg/dL above the upper limit of normal  Kidney ds.  Osteoporosis (or Vb fx) Age <70 years old New (2013): High UCa >400 mg/d and increased stone risk by biochemical stone risk analysis Presence of nephrolithiasis or nephrocalcinosis Pt's preference!  - she does meet the criteria based on a calcium of 11.6  It is unclear whether she now has osteoporosis (she is  on Fosamax, anyway), and  we may need to check a DEXA scan in the near future. I would add a 33% distal radius for evaluation of cortical bone.  - I will also check: calcium level intact PTH ([facility]) Magnesium Phosphorus vitamin D- 25 HO and 1,25 HO  24h urinary calcium/creatinine ratio - if vit D normal - pt given instructions for urine collection and the jug - If the tests indicate a parathyroid adenoma, I will refer her to surgery.  Patient and daughter agree to plan.  - We discussed possible consequences of hyperparathyroidism: ~1/3 pts will develop complications over 15 years (OP, nephrolithiasis).  I will wait for the results of the above labs and will discuss with the plan with the patient.  - I will see the patient back in 4 months,  but we will discuss about the results through my chart or by phone  Component     Latest Ref Rng 01/29/2014  Sodium     135 - 145 mEq/L 137  Potassium     3.5 - 5.1 mEq/L 4.3  Chloride     96 - 112 mEq/L 110   CO2     19 - 32 mEq/L 25  Glucose     70 - 99 mg/dL 90  BUN     6 - 23 mg/dL 14  Creatinine     0.4 - 1.2 mg/dL 0.8  Calcium     8.4 - 10.5 mg/dL 14.710.7 (H)  GFR     >82.95>60.00 mL/min 91.08  PTH     15 - 65 pg/mL 106 (H)  VITD     30.00 - 100.00 ng/mL 29.54 (L)  Magnesium     1.5 - 2.5 mg/dL 2.1  Phosphorus     2.3 - 4.6 mg/dL 2.9  Vitamin 1, 25 dihydroxy vitamin D was not drawn by lab.  Vitamin D slightly low, but not enough to explain the elevated PTH in the setting of elevated calcium. Will suggest the patient increases her vitamin D supplement dose to 2000 units daily, but will also advise her to go ahead and collect a 24-hour urine for calcium. The labs above indicate primary hyperparathyroidism. Will await the urine calcium to make further comments.  Component     Latest Ref Rng 02/14/2014  Calcium, Ur      15  Calcium, 24 hour urine     100 - 250 mg/day 102  Creatinine, Urine      58.3  Creatinine, 24H Ur     700 - 1800 mg/day 396 (L)  Cr low >> not an appropriate collection >> need to repeat.  Component     Latest Ref Rng 02/27/2014  Calcium, Ur      12  Calcium, 24 hour urine     100 - 250 mg/day 288 (H)  Creatinine, Urine      42.9  Creatinine, 24H Ur     700 - 1800 mg/day 1031   Urinary calcium also elevated >> will refer to surgery for primary hyperparathyroidism.

## 2014-01-31 LAB — PARATHYROID HORMONE, INTACT (NO CA): PTH: 106 pg/mL — ABNORMAL HIGH (ref 15–65)

## 2014-02-14 ENCOUNTER — Other Ambulatory Visit: Payer: Medicare HMO

## 2014-02-14 DIAGNOSIS — E213 Hyperparathyroidism, unspecified: Secondary | ICD-10-CM

## 2014-02-20 LAB — CALCIUM, URINE, 24 HOUR
CALCIUM 24HR UR: 102 mg/d (ref 100–250)
Calcium, Ur: 15 mg/dL

## 2014-02-20 LAB — CREATININE, URINE, 24 HOUR
CREATININE 24H UR: 396 mg/d — AB (ref 700–1800)
CREATININE, URINE: 58.3 mg/dL

## 2014-02-21 ENCOUNTER — Encounter: Payer: Self-pay | Admitting: *Deleted

## 2014-02-27 ENCOUNTER — Other Ambulatory Visit: Payer: Medicare HMO

## 2014-02-27 DIAGNOSIS — E213 Hyperparathyroidism, unspecified: Secondary | ICD-10-CM | POA: Diagnosis not present

## 2014-02-28 ENCOUNTER — Encounter: Payer: Self-pay | Admitting: *Deleted

## 2014-02-28 LAB — CREATININE, URINE, 24 HOUR
CREATININE, URINE: 42.9 mg/dL
Creatinine, 24H Ur: 1031 mg/d (ref 700–1800)

## 2014-02-28 LAB — CALCIUM, URINE, 24 HOUR
Calcium, 24 hour urine: 288 mg/d — ABNORMAL HIGH (ref 100–250)
Calcium, Ur: 12 mg/dL

## 2014-02-28 NOTE — Addendum Note (Signed)
Addended by: Carlus PavlovGHERGHE, Nichola Cieslinski on: 02/28/2014 07:50 AM   Modules accepted: Orders, Level of Service

## 2014-03-05 ENCOUNTER — Telehealth: Payer: Self-pay | Admitting: *Deleted

## 2014-03-05 NOTE — Telephone Encounter (Signed)
Yes, continue same vit D dose.

## 2014-03-05 NOTE — Telephone Encounter (Signed)
Called pt and lvm advising her per Dr Elvera LennoxGherghe to continue with the same Vitamin D dose. Advised pt to call with any questions.

## 2014-03-05 NOTE — Telephone Encounter (Signed)
Pt called asking for us to clarify her lab results. I did this. Pt also asked if she needs to continue to take Vitamin D. She is taking 2 tablets of 1, 000 mg daily. Please advise.

## 2014-03-12 ENCOUNTER — Other Ambulatory Visit: Payer: Self-pay | Admitting: Internal Medicine

## 2014-03-19 ENCOUNTER — Telehealth: Payer: Self-pay | Admitting: Internal Medicine

## 2014-03-19 NOTE — Telephone Encounter (Signed)
Patient stated that she was suppose to be referred to Dr Gerrit FriendsGerkin, but she never heard anything from there office.

## 2014-03-26 ENCOUNTER — Other Ambulatory Visit (INDEPENDENT_AMBULATORY_CARE_PROVIDER_SITE_OTHER): Payer: Self-pay | Admitting: General Surgery

## 2014-03-26 ENCOUNTER — Other Ambulatory Visit (INDEPENDENT_AMBULATORY_CARE_PROVIDER_SITE_OTHER): Payer: Self-pay | Admitting: *Deleted

## 2014-03-26 DIAGNOSIS — E210 Primary hyperparathyroidism: Secondary | ICD-10-CM | POA: Diagnosis not present

## 2014-03-26 DIAGNOSIS — E213 Hyperparathyroidism, unspecified: Secondary | ICD-10-CM

## 2014-03-26 NOTE — Addendum Note (Signed)
Addended by: Axel FillerAMIREZ, Dace Denn on: 03/26/2014 09:28 AM   Modules accepted: Orders

## 2014-04-10 ENCOUNTER — Encounter (HOSPITAL_COMMUNITY): Payer: Self-pay

## 2014-04-10 ENCOUNTER — Encounter (HOSPITAL_COMMUNITY)
Admission: RE | Admit: 2014-04-10 | Discharge: 2014-04-10 | Disposition: A | Discharge status: Home / Self Care | Payer: Commercial Managed Care - HMO | Source: Ambulatory Visit | Attending: General Surgery | Admitting: General Surgery

## 2014-04-10 DIAGNOSIS — E0590 Thyrotoxicosis, unspecified without thyrotoxic crisis or storm: Secondary | ICD-10-CM | POA: Diagnosis not present

## 2014-04-10 DIAGNOSIS — E213 Hyperparathyroidism, unspecified: Secondary | ICD-10-CM | POA: Insufficient documentation

## 2014-04-10 MED ORDER — TECHNETIUM TC 99M SESTAMIBI - CARDIOLITE
25.3000 | Freq: Once | INTRAVENOUS | Status: AC | PRN
  Administered 2014-04-10: 25.3 via INTRAVENOUS

## 2014-07-02 ENCOUNTER — Other Ambulatory Visit: Payer: Self-pay | Admitting: Internal Medicine

## 2014-07-02 DIAGNOSIS — Z1231 Encounter for screening mammogram for malignant neoplasm of breast: Secondary | ICD-10-CM

## 2014-07-03 ENCOUNTER — Ambulatory Visit (INDEPENDENT_AMBULATORY_CARE_PROVIDER_SITE_OTHER): Payer: Commercial Managed Care - HMO | Admitting: Internal Medicine

## 2014-07-03 DIAGNOSIS — I10 Essential (primary) hypertension: Secondary | ICD-10-CM

## 2014-07-03 NOTE — Progress Notes (Signed)
Pre visit review using our clinic review tool, if applicable. No additional management support is needed unless otherwise documented below in the visit note. 

## 2014-07-06 ENCOUNTER — Ambulatory Visit (INDEPENDENT_AMBULATORY_CARE_PROVIDER_SITE_OTHER): Payer: Commercial Managed Care - HMO | Admitting: Internal Medicine

## 2014-07-06 ENCOUNTER — Other Ambulatory Visit (INDEPENDENT_AMBULATORY_CARE_PROVIDER_SITE_OTHER): Payer: Commercial Managed Care - HMO

## 2014-07-06 ENCOUNTER — Encounter: Payer: Self-pay | Admitting: Internal Medicine

## 2014-07-06 VITALS — BP 126/72 | HR 89 | Temp 98.7°F | Wt 192.1 lb

## 2014-07-06 DIAGNOSIS — Z0000 Encounter for general adult medical examination without abnormal findings: Secondary | ICD-10-CM

## 2014-07-06 DIAGNOSIS — H538 Other visual disturbances: Secondary | ICD-10-CM

## 2014-07-06 DIAGNOSIS — E213 Hyperparathyroidism, unspecified: Secondary | ICD-10-CM

## 2014-07-06 LAB — URINALYSIS, ROUTINE W REFLEX MICROSCOPIC
Bilirubin Urine: NEGATIVE
Hgb urine dipstick: NEGATIVE
Ketones, ur: NEGATIVE
Leukocytes, UA: NEGATIVE
Nitrite: NEGATIVE
RBC / HPF: NONE SEEN
Specific Gravity, Urine: >=1.03 — AB
Total Protein, Urine: NEGATIVE
Urine Glucose: NEGATIVE
Urobilinogen, UA: 0.2
pH: 5.5 (ref 5.0–8.0)

## 2014-07-06 LAB — HEPATIC FUNCTION PANEL
ALT: 9 U/L (ref 0–35)
AST: 16 U/L (ref 0–37)
Albumin: 4.1 g/dL (ref 3.5–5.2)
Alkaline Phosphatase: 62 U/L (ref 39–117)
BILIRUBIN DIRECT: 0.1 mg/dL (ref 0.0–0.3)
Total Bilirubin: 0.4 mg/dL (ref 0.2–1.2)
Total Protein: 7.4 g/dL (ref 6.0–8.3)

## 2014-07-06 LAB — CBC WITH DIFFERENTIAL/PLATELET
BASOS ABS: 0 10*3/uL (ref 0.0–0.1)
BASOS PCT: 1.4 % (ref 0.0–3.0)
Eosinophils Absolute: 0.1 10*3/uL (ref 0.0–0.7)
Eosinophils Relative: 3.5 % (ref 0.0–5.0)
HCT: 39.5 % (ref 36.0–46.0)
HEMOGLOBIN: 13.2 g/dL (ref 12.0–15.0)
LYMPHS ABS: 0.7 10*3/uL (ref 0.7–4.0)
LYMPHS PCT: 21.2 % (ref 12.0–46.0)
MCHC: 33.4 g/dL (ref 30.0–36.0)
MCV: 86.3 fl (ref 78.0–100.0)
Monocytes Absolute: 0.4 10*3/uL (ref 0.1–1.0)
Monocytes Relative: 12.3 % — ABNORMAL HIGH (ref 3.0–12.0)
NEUTROS ABS: 2.1 10*3/uL (ref 1.4–7.7)
Neutrophils Relative %: 61.6 % (ref 43.0–77.0)
Platelets: 250 10*3/uL (ref 150.0–400.0)
RBC: 4.58 Mil/uL (ref 3.87–5.11)
RDW: 13.6 % (ref 11.5–15.5)
WBC: 3.4 10*3/uL — AB (ref 4.0–10.5)

## 2014-07-06 LAB — BASIC METABOLIC PANEL
BUN: 9 mg/dL (ref 6–23)
CALCIUM: 11.4 mg/dL — AB (ref 8.4–10.5)
CO2: 28 mEq/L (ref 19–32)
Chloride: 107 mEq/L (ref 96–112)
Creatinine, Ser: 0.67 mg/dL (ref 0.40–1.20)
GFR: 111.62 mL/min (ref >60.00)
Glucose, Bld: 81 mg/dL (ref 70–99)
Potassium: 4.2 mEq/L (ref 3.5–5.1)
SODIUM: 139 meq/L (ref 135–145)

## 2014-07-06 LAB — LIPID PANEL
CHOL/HDL RATIO: 3
Cholesterol: 169 mg/dL (ref 0–200)
HDL: 62.8 mg/dL (ref >39.00)
LDL Cholesterol: 95 mg/dL (ref 0–99)
NONHDL: 106.2
Triglycerides: 56 mg/dL (ref 0.0–149.0)
VLDL: 11.2 mg/dL (ref 0.0–40.0)

## 2014-07-06 LAB — TSH: TSH: 0.5 u[IU]/mL (ref 0.35–4.50)

## 2014-07-06 NOTE — Patient Instructions (Signed)
Please continue all other medications as before, and refills have been done if requested.  Please have the pharmacy call with any other refills you may need.  Please continue your efforts at being more active, low cholesterol diet, and weight control.  You are otherwise up to date with prevention measures today.  Please keep your appointments with your specialists as you may have planned  You will be contacted regarding the referral for: endocrinology (Dr Elvera LennoxGherghe) and opthomology as well  Please go to the LAB in the Basement (turn left off the elevator) for the tests to be done today  You will be contacted by phone if any changes need to be made immediately.  Otherwise, you will receive a letter about your results with an explanation, but please check with MyChart first.  Please remember to sign up for MyChart if you have not done so, as this will be important to you in the future with finding out test results, communicating by private email, and scheduling acute appointments online when needed.  Please return in 6 months, or sooner if needed

## 2014-07-06 NOTE — Progress Notes (Signed)
Pre visit review using our clinic review tool, if applicable. No additional management support is needed unless otherwise documented below in the visit note. 

## 2014-07-06 NOTE — Assessment & Plan Note (Signed)

## 2014-07-06 NOTE — Progress Notes (Signed)
Subjective:    Patient ID: [name], female    DOB: 02/05/1946, 69 y.o.   MRN: [id number]  HPI   Here for wellness and f/u;  Overall doing ok;  Pt denies Chest pain, worsening SOB, DOE, wheezing, orthopnea, PND, worsening LE edema, palpitations, dizziness or syncope.  Pt denies neurological change such as new headache, facial or extremity weakness.  Pt denies polydipsia, polyuria, or low sugar symptoms. Pt states overall good compliance with treatment and medications, good tolerability, and has been trying to follow appropriate diet.  Pt denies worsening depressive symptoms, suicidal ideation or panic. No fever, night sweats, wt loss, loss of appetite, or other constitutional symptoms.  Pt states good ability with ADL's, has low fall risk, home safety reviewed and adequate, no other significant changes in hearing or vision, and only occasionally active with exercise.  Has seen Dr Reece AgarG for hyperpara thyroid, then Dr Derrell Lollingamirez but has not heard about any proposed surgury date.  Was asked to f/u with endo at 4 mo as well (dr Reece AgarG).  Still declines prevnar. Past Medical History  Diagnosis Date  . HYPOTHYROIDISM 11/25/2006  . HYPERCHOLESTEROLEMIA 11/25/2006  . HYPERLIPIDEMIA 11/18/2009  . OBESITY 12/04/2006  . DEPRESSION 11/25/2006  . OTITIS MEDIA, ACUTE, LEFT 11/18/2009  . HYPERTENSION 07/15/2007  . PULMONARY NODULE 11/25/2006  . DIVERTICULOSIS, COLON 12/04/2006  . POSTMENOPAUSAL STATUS 11/25/2006  . DEGENERATIVE JOINT DISEASE 11/25/2006  . OSTEOPENIA 11/25/2006  . DIZZINESS 11/18/2009  . PALPITATIONS, RECURRENT 07/20/2008  . HYPERPARATHYROIDISM, HX OF 11/25/2006  . Vitamin D deficiency   . DJD (degenerative joint disease)     Bilateral Hip  . Allergic rhinitis, cause unspecified 12/26/2010  . Cataract     1 eye-? which   Past Surgical History  Procedure Laterality Date  . Left knee    . Right wrist    . Abdominal hysterectomy    . Total hip arthroplasty      left 3/05, right 2002  . Colonoscopy      . Polypectomy    . Thyroid surgery      early 70's  . Umbilical hernia repair      reports that she has quit smoking. She has quit using smokeless tobacco. She reports that she does not drink alcohol or use illicit drugs. family history includes Breast cancer in her mother; Hypertension in her other; Stroke in her father. There is no history of Colon cancer, Esophageal cancer, Rectal cancer, or Stomach cancer. No Known Allergies Current Outpatient Prescriptions on File Prior to Visit  Medication Sig Dispense Refill  . alendronate (FOSAMAX) 70 MG tablet TAKE 1 TABLET ONE TIME EVERY WEEK  WITH  A  FULL  GLASS  OF  WATER ON AN EMPTY STOMACH  12 tablet 3  . aspirin 81 MG EC tablet Take 81 mg by mouth every morning.     . cholecalciferol (VITAMIN D) 1000 UNITS tablet Take 1,000 Units by mouth daily.    Marland Kitchen. levothyroxine (SYNTHROID, LEVOTHROID) 75 MCG tablet TAKE 1 TABLET EVERY DAY 90 tablet 3  . lisinopril (PRINIVIL,ZESTRIL) 5 MG tablet TAKE 1 TABLET EVERY DAY 90 tablet 3  . lovastatin (MEVACOR) 20 MG tablet TAKE 1 TABLET EVERY DAY 90 tablet 3  . Naproxen Sodium (ALEVE) 220 MG CAPS Take 2 capsules by mouth daily as needed.    . psyllium (HYDROCIL/METAMUCIL) 95 % PACK Take 1 packet by mouth daily as needed. For constipation     No current facility-administered medications on file prior  to visit.   Review of Systems Constitutional: Negative for increased diaphoresis, other activity, appetite or siginficant weight change other than noted HENT: Negative for worsening hearing loss, ear pain, facial swelling, mouth sores and neck stiffness.   Eyes: Negative for other worsening pain, redness or visual disturbance.  Respiratory: Negative for shortness of breath and wheezing  Cardiovascular: Negative for chest pain and palpitations.  Gastrointestinal: Negative for diarrhea, blood in stool, abdominal distention or other pain Genitourinary: Negative for hematuria, flank pain or change in urine volume.   Musculoskeletal: Negative for myalgias or other joint complaints.  Skin: Negative for color change and wound or drainage.  Neurological: Negative for syncope and numbness. other than noted Hematological: Negative for adenopathy. or other swelling Psychiatric/Behavioral: Negative for hallucinations, SI, self-injury, decreased concentration or other worsening agitation.      Objective:   Physical Exam BP 126/72 mmHg  Pulse 89  Temp(Src) 98.7 F (37.1 C) (Oral)  Wt 192 lb 1.3 oz (87.127 kg)  SpO2 96% VS noted,  Constitutional: Pt is oriented to person, place, and time. Appears well-developed and well-nourished, in no significant distress Head: Normocephalic and atraumatic.  Right Ear: External ear normal.  Left Ear: External ear normal.  Nose: Nose normal.  Mouth/Throat: Oropharynx is clear and moist.  Eyes: Conjunctivae and EOM are normal. Pupils are equal, round, and reactive to light.  Neck: Normal range of motion. Neck supple. No JVD present. No tracheal deviation present or significant neck LA or mass Cardiovascular: Normal rate, regular rhythm, normal heart sounds and intact distal pulses.   Pulmonary/Chest: Effort normal and breath sounds without rales or wheezing  Abdominal: Soft. Bowel sounds are normal. NT. No HSM  Musculoskeletal: Normal range of motion. Exhibits no edema.  Lymphadenopathy:  Has no cervical adenopathy.  Neurological: Pt is alert and oriented to person, place, and time. Pt has normal reflexes. No cranial nerve deficit. Motor grossly intact Skin: Skin is warm and dry. No rash noted.  Psychiatric:  Has normal mood and affect. Behavior is normal.     Assessment & Plan:

## 2014-07-06 NOTE — Assessment & Plan Note (Signed)
For referral to endo as per insurance, may need re-referred to siurgury as well

## 2014-08-09 ENCOUNTER — Ambulatory Visit (HOSPITAL_COMMUNITY)
Admission: RE | Admit: 2014-08-09 | Discharge: 2014-08-09 | Disposition: A | Discharge status: Home / Self Care | Payer: Commercial Managed Care - HMO | Source: Ambulatory Visit | Attending: Internal Medicine | Admitting: Internal Medicine

## 2014-08-09 DIAGNOSIS — Z1231 Encounter for screening mammogram for malignant neoplasm of breast: Secondary | ICD-10-CM | POA: Diagnosis not present

## 2014-09-04 DIAGNOSIS — H5210 Myopia, unspecified eye: Secondary | ICD-10-CM | POA: Diagnosis not present

## 2014-09-04 DIAGNOSIS — H524 Presbyopia: Secondary | ICD-10-CM | POA: Diagnosis not present

## 2014-09-13 ENCOUNTER — Other Ambulatory Visit: Payer: Self-pay | Admitting: Internal Medicine

## 2014-09-13 DIAGNOSIS — E210 Primary hyperparathyroidism: Secondary | ICD-10-CM

## 2014-09-14 ENCOUNTER — Ambulatory Visit: Payer: Commercial Managed Care - HMO | Admitting: Internal Medicine

## 2014-10-09 ENCOUNTER — Ambulatory Visit: Payer: Self-pay | Admitting: Surgery

## 2014-10-09 DIAGNOSIS — E210 Primary hyperparathyroidism: Secondary | ICD-10-CM | POA: Diagnosis not present

## 2014-11-12 ENCOUNTER — Encounter (HOSPITAL_COMMUNITY)
Admission: RE | Admit: 2014-11-12 | Discharge: 2014-11-12 | Disposition: A | Discharge status: Home / Self Care | Payer: Commercial Managed Care - HMO | Source: Ambulatory Visit | Attending: Surgery | Admitting: Surgery

## 2014-11-12 ENCOUNTER — Encounter (HOSPITAL_COMMUNITY): Payer: Self-pay

## 2014-11-12 DIAGNOSIS — Z01812 Encounter for preprocedural laboratory examination: Secondary | ICD-10-CM | POA: Diagnosis not present

## 2014-11-12 DIAGNOSIS — E210 Primary hyperparathyroidism: Secondary | ICD-10-CM | POA: Diagnosis not present

## 2014-11-12 DIAGNOSIS — Z01818 Encounter for other preprocedural examination: Secondary | ICD-10-CM | POA: Diagnosis not present

## 2014-11-12 HISTORY — DX: Headache, unspecified: R51.9

## 2014-11-12 HISTORY — DX: Headache: R51

## 2014-11-12 LAB — BASIC METABOLIC PANEL
Anion gap: 7 (ref 5–15)
BUN: 8 mg/dL (ref 6–20)
CHLORIDE: 107 mmol/L (ref 101–111)
CO2: 26 mmol/L (ref 22–32)
CREATININE: 0.7 mg/dL (ref 0.44–1.00)
Calcium: 10.7 mg/dL — ABNORMAL HIGH (ref 8.9–10.3)
GFR calc Af Amer: >60 mL/min (ref >60)
GFR calc non Af Amer: >60 mL/min (ref >60)
Glucose, Bld: 83 mg/dL (ref 65–99)
Potassium: 4 mmol/L (ref 3.5–5.1)
SODIUM: 140 mmol/L (ref 135–145)

## 2014-11-12 LAB — CBC
HCT: 38.9 % (ref 36.0–46.0)
Hemoglobin: 12.7 g/dL (ref 12.0–15.0)
MCH: 29.4 pg (ref 26.0–34.0)
MCHC: 32.6 g/dL (ref 30.0–36.0)
MCV: 90 fL (ref 78.0–100.0)
PLATELETS: 238 10*3/uL (ref 150–400)
RBC: 4.32 MIL/uL (ref 3.87–5.11)
RDW: 13 % (ref 11.5–15.5)
WBC: 3.3 10*3/uL — AB (ref 4.0–10.5)

## 2014-11-12 NOTE — Progress Notes (Signed)
   11/12/14 1038  OBSTRUCTIVE SLEEP APNEA  Have you ever been diagnosed with sleep apnea through a sleep study? No  Do you snore loudly (loud enough to be heard through closed doors)?  0  Do you often feel tired, fatigued, or sleepy during the daytime (such as falling asleep during driving or talking to someone)? 1  Has anyone observed you stop breathing during your sleep? 0  Do you have, or are you being treated for high blood pressure? 1  BMI more than 35 kg/m2? 1  Age > 50 (1-yes) 1  Neck circumference greater than:Female 16 inches or larger, Female 17inches or larger? 0  Female Gender (Yes=1) 0  Obstructive Sleep Apnea Score 4  Score 5 or greater  Results sent to PCP

## 2014-11-12 NOTE — Pre-Procedure Instructions (Signed)
[facility]  11/12/2014      [facility] - [place], [place] - [address] [place] [address] Phone: [phone] Fax: [phone]  [facility] - [place], [place] - [address] [place] [address] Phone: [phone] Fax: [phone]  [facility], [place], [place] - [address] [place] [address] Phone: [phone] Fax: [phone]    Your procedure is scheduled on Tuesday, November 20, 2014  Report to [facility] Admitting at 8:30 A.M.  Call this number if you have problems the morning of surgery:  [phone]   Remember:  Do not eat food or drink liquids after midnight Monday, November 19, 2014  Take these medicines the morning of surgery with A SIP OF WATER : levothyroxine (SYNTHROID, LEVOTHROID), if needed: eye drops for dry eyes  Stop taking Aspirin, vitamins and herbal medications. Do not take any NSAIDs ie: Ibuprofen, Advil, Naproxen (Aleve) or any medication containing Aspirin; stop Tuesday, November 13, 2014.   Do not wear jewelry, make-up or nail polish.   Do not wear lotions, powders, or perfumes.  You may not wear deodorant.   Do not shave 48 hours prior to surgery.    Do not bring valuables to the hospital.   [facility] is not responsible for any belongings or valuables.  Contacts, dentures or bridgework may not be worn into surgery.  Leave your suitcase in the car.  After surgery it may be brought to your room.  For patients admitted to the hospital, discharge time will be determined by your treatment team.  Patients discharged the day of surgery will not be allowed to drive home.   Name and phone number of your driver:  Daughter & Spouse Special instructions: [place] - Preparing for Surgery  Before surgery, you can play an important role.  Because skin is not sterile, your skin needs to be as free of  germs as possible.  You can reduce the number of germs on you skin by washing with CHG (chlorahexidine gluconate) soap before surgery.  CHG is an antiseptic cleaner which kills germs and bonds with the skin to continue killing germs even after washing.  Please DO NOT use if you have an allergy to CHG or antibacterial soaps.  If your skin becomes reddened/irritated stop using the CHG and inform your nurse when you arrive at Short Stay.  Do not shave (including legs and underarms) for at least 48 hours prior to the first CHG shower.  You may shave your face.  Please follow these instructions carefully:   1.  Shower with CHG Soap the night before surgery and the morning of Surgery.  2.  If you choose to wash your hair, wash your hair first as usual with your normal shampoo.  3.  After you shampoo, rinse your hair and body thoroughly to remove the Shampoo.  4.  Use CHG as you would any other liquid soap.  You can apply chg directly  to the skin and wash gently with scrungie or a clean washcloth.  5.  Apply the CHG Soap to your body ONLY FROM THE NECK DOWN.  Do not use on open wounds or open sores.  Avoid contact with your eyes, ears, mouth and genitals (private parts).  Wash genitals (private parts) with your normal soap.  6.  Wash thoroughly, paying special attention to the area where  your surgery will be performed.  7.  Thoroughly rinse your body with warm water from the neck down.  8.  DO NOT shower/wash with your normal soap after using and rinsing off the CHG Soap.  9.  Pat yourself dry with a clean towel.            10.  Wear clean pajamas.            11.  Place clean sheets on your bed the night of your first shower and do not sleep with pets.  Day of Surgery  Do not apply any lotions/deodorants the morning of surgery.  Please wear clean clothes to the hospital/surgery center.  Please read over the following fact sheets that you were given. Pain Booklet, Coughing and Deep Breathing and  Surgical Site Infection Prevention

## 2014-11-14 ENCOUNTER — Telehealth: Payer: Self-pay

## 2014-11-14 ENCOUNTER — Telehealth: Payer: Self-pay | Admitting: Internal Medicine

## 2014-11-14 DIAGNOSIS — R069 Unspecified abnormalities of breathing: Secondary | ICD-10-CM

## 2014-11-14 NOTE — Telephone Encounter (Signed)
Pt called stated she has a parathyroidactomy on 11/20/14 but Dr. Jonny Ruiz refer her to see pulmonary ( they can not see her until after 11/20/2014). Pt was wondering if she need to see the pulmonary before the surgery or can she wait until after the surgery to see them? Please call pt back

## 2014-11-14 NOTE — Telephone Encounter (Signed)
Dahlia to contact pt -     Ok to inform that recent hospital screen test was suggesting of possible sleep apnea; can I refer to pulm for further evaluation?    Pt agreed to pulmonary referral

## 2014-11-14 NOTE — Telephone Encounter (Signed)
Referral done

## 2014-11-14 NOTE — Telephone Encounter (Signed)
Pt advised that surgery is priority

## 2014-11-19 ENCOUNTER — Encounter (HOSPITAL_COMMUNITY): Payer: Self-pay | Admitting: Surgery

## 2014-11-19 MED ORDER — CEFAZOLIN SODIUM-DEXTROSE 2-3 GM-% IV SOLR
2.0000 g | INTRAVENOUS | Status: AC
  Administered 2014-11-20: 2 g via INTRAVENOUS
  Filled 2014-11-19: qty 50

## 2014-11-19 NOTE — H&P (Signed)
  General Surgery [facility]  [name] DOB: 1945-02-22 Married / Language: English / Race: Black or African American Female  History of Present Illness  The patient is a 69 year old female who presents with a parathyroid neoplasm. Patient presents on referral from her endocrinologist, Dr. Hermelinda Dellen, for evaluation of primary hyperparathyroidism. Patient was initially diagnosed by her primary care physician with hypercalcemia. Laboratory studies dating back to 2009 demonstrate hypercalcemia ranging from 10.6-11.4. Her most recent intact PTH level was elevated at 161. Patient was seen and evaluated by one of my partners. She underwent nuclear medicine parathyroid scan in February 2016. This localized a right inferior parathyroid adenoma. Patient returns today to discuss surgical intervention. Patient does have osteoporosis and is taking Fosamax. She has had no recent fractures. She denies nephrolithiasis. Patient has had previous thyroid surgery but she is unsure of the indications for surgery or the extent of that procedure.   Allergies No Known Drug Allergies  Medication History Alendronate Sodium (  Tablet, Oral daily) Active. Levothyroxine Sodium ( Tablet, Oral daily) Active. Lisinopril (  Tablet, Oral daily) Active. Lovastatin (  Tablet, Oral daily) Active. Aspirin EC Low Strength (  Tablet DR, Oral daily) Active. Vitamin D (1000UNIT Tablet, Oral daily) Active. Metamucil (0.52GM Capsule, Oral daily) Active. Aleve (  Capsule, Oral as needed) Active. Medications Reconciled  Vitals  Weight: 190.2 lb Height: 56in Body Surface Area: 1.85 m Body Mass Index: 42.64 kg/m Temp.: 98.71F(Oral)  Pulse: 86 (Regular)  BP: 136/80 (Sitting, Left Arm, Standard)    Physical Exam  General - appears comfortable, no distress; not diaphorectic  HEENT - normocephalic; sclerae clear, gaze conjugate; mucous  membranes moist, dentition good; voice normal  Neck - symmetric on extension; no palpable anterior or posterior cervical adenopathy; no palpable masses in the thyroid bed; well-healed traditional collar incision on neck  Chest - clear bilaterally without rhonchi, rales, or wheeze  Cor - regular rhythm with normal rate; no significant murmur  Ext - non-tender without significant edema or lymphedema  Neuro - grossly intact; no tremor   Assessment & Plan  PRIMARY HYPERPARATHYROIDISM (252.01  E21.0)  I discussed the above findings in detail with the patient. We reviewed her nuclear medicine parathyroid scan and her laboratory studies. I provided her with a copy of her scan results.  Patient is a good candidate for right inferior minimally invasive parathyroidectomy. We discussed risk and benefits of the procedure, especially given her prior history of thyroid surgery. I believe she can have this procedure as an outpatient, although she may require an overnight observation. We discussed the location of the surgical incision, her postoperative recovery, and the need for further postoperative testing including laboratory studies. She understands and agrees to proceed with surgery in the near future.  The risks and benefits of the procedure have been discussed at length with the patient. The patient understands the proposed procedure, potential alternative treatments, and the course of recovery to be expected. All of the patient's questions have been answered at this time. The patient wishes to proceed with surgery.  Velora Heckler, MD, FACS General & Endocrine Surgery [facility] Office: [phone]

## 2014-11-20 ENCOUNTER — Encounter (HOSPITAL_COMMUNITY): Payer: Self-pay | Admitting: Surgery

## 2014-11-20 ENCOUNTER — Ambulatory Visit (HOSPITAL_COMMUNITY)
Admission: RE | Admit: 2014-11-20 | Discharge: 2014-11-20 | Disposition: A | Discharge status: Home / Self Care | Payer: Commercial Managed Care - HMO | Source: Ambulatory Visit | Attending: Surgery | Admitting: Surgery

## 2014-11-20 ENCOUNTER — Ambulatory Visit (HOSPITAL_COMMUNITY): Payer: Commercial Managed Care - HMO | Admitting: Anesthesiology

## 2014-11-20 ENCOUNTER — Encounter (HOSPITAL_COMMUNITY)
Admission: RE | Disposition: A | Discharge status: Home / Self Care | Payer: Self-pay | Source: Ambulatory Visit | Attending: Surgery

## 2014-11-20 DIAGNOSIS — I10 Essential (primary) hypertension: Secondary | ICD-10-CM | POA: Insufficient documentation

## 2014-11-20 DIAGNOSIS — Z79899 Other long term (current) drug therapy: Secondary | ICD-10-CM | POA: Insufficient documentation

## 2014-11-20 DIAGNOSIS — F329 Major depressive disorder, single episode, unspecified: Secondary | ICD-10-CM | POA: Insufficient documentation

## 2014-11-20 DIAGNOSIS — Z7982 Long term (current) use of aspirin: Secondary | ICD-10-CM | POA: Insufficient documentation

## 2014-11-20 DIAGNOSIS — M810 Age-related osteoporosis without current pathological fracture: Secondary | ICD-10-CM | POA: Insufficient documentation

## 2014-11-20 DIAGNOSIS — Z87891 Personal history of nicotine dependence: Secondary | ICD-10-CM | POA: Diagnosis not present

## 2014-11-20 DIAGNOSIS — M1990 Unspecified osteoarthritis, unspecified site: Secondary | ICD-10-CM | POA: Diagnosis not present

## 2014-11-20 DIAGNOSIS — E210 Primary hyperparathyroidism: Secondary | ICD-10-CM | POA: Diagnosis not present

## 2014-11-20 HISTORY — PX: PARATHYROIDECTOMY: SHX19

## 2014-11-20 SURGERY — PARATHYROIDECTOMY
Anesthesia: General | Site: Neck | Laterality: Right

## 2014-11-20 MED ORDER — BUPIVACAINE HCL (PF) 0.25 % IJ SOLN
INTRAMUSCULAR | Status: AC
  Filled 2014-11-20: qty 30

## 2014-11-20 MED ORDER — NEOSTIGMINE METHYLSULFATE 10 MG/10ML IV SOLN
INTRAVENOUS | Status: DC | PRN
Start: 2014-11-20 — End: 2014-11-20
  Administered 2014-11-20: 4 mg via INTRAVENOUS

## 2014-11-20 MED ORDER — FENTANYL CITRATE (PF) 100 MCG/2ML IJ SOLN
INTRAMUSCULAR | Status: DC | PRN
  Administered 2014-11-20 (×2): 50 ug via INTRAVENOUS

## 2014-11-20 MED ORDER — ONDANSETRON HCL 4 MG/2ML IJ SOLN
INTRAMUSCULAR | Status: DC | PRN
  Administered 2014-11-20: 4 mg via INTRAVENOUS

## 2014-11-20 MED ORDER — BUPIVACAINE-EPINEPHRINE (PF) 0.25% -1:200000 IJ SOLN
INTRAMUSCULAR | Status: AC
  Filled 2014-11-20: qty 30

## 2014-11-20 MED ORDER — BUPIVACAINE HCL (PF) 0.25 % IJ SOLN
INTRAMUSCULAR | Status: DC | PRN
  Administered 2014-11-20: 10 mL

## 2014-11-20 MED ORDER — 0.9 % SODIUM CHLORIDE (POUR BTL) OPTIME
TOPICAL | Status: DC | PRN
  Administered 2014-11-20: 1000 mL

## 2014-11-20 MED ORDER — MEPERIDINE HCL 25 MG/ML IJ SOLN: 6.2500 mg | INTRAMUSCULAR | Status: DC | PRN

## 2014-11-20 MED ORDER — HYDROMORPHONE HCL 1 MG/ML IJ SOLN: 0.2500 mg | INTRAMUSCULAR | Status: DC | PRN

## 2014-11-20 MED ORDER — ROCURONIUM BROMIDE 100 MG/10ML IV SOLN
INTRAVENOUS | Status: DC | PRN
  Administered 2014-11-20: 30 mg via INTRAVENOUS

## 2014-11-20 MED ORDER — HYDROCODONE-ACETAMINOPHEN 5-325 MG PO TABS: 1.0000 | ORAL_TABLET | ORAL | Status: DC | PRN

## 2014-11-20 MED ORDER — PROMETHAZINE HCL 25 MG/ML IJ SOLN: 6.2500 mg | INTRAMUSCULAR | Status: DC | PRN

## 2014-11-20 MED ORDER — PROPOFOL 10 MG/ML IV BOLUS
INTRAVENOUS | Status: AC
  Filled 2014-11-20: qty 20

## 2014-11-20 MED ORDER — FENTANYL CITRATE (PF) 250 MCG/5ML IJ SOLN
INTRAMUSCULAR | Status: AC
  Filled 2014-11-20: qty 5

## 2014-11-20 MED ORDER — LACTATED RINGERS IV SOLN
INTRAVENOUS | Status: DC
  Administered 2014-11-20: 09:00:00 via INTRAVENOUS

## 2014-11-20 MED ORDER — LACTATED RINGERS IV SOLN
INTRAVENOUS | Status: DC | PRN
  Administered 2014-11-20: 11:00:00 via INTRAVENOUS

## 2014-11-20 MED ORDER — GLYCOPYRROLATE 0.2 MG/ML IJ SOLN
INTRAMUSCULAR | Status: DC | PRN
  Administered 2014-11-20: 0.6 mg via INTRAVENOUS

## 2014-11-20 MED ORDER — PROPOFOL 10 MG/ML IV BOLUS
INTRAVENOUS | Status: DC | PRN
  Administered 2014-11-20: 150 mg via INTRAVENOUS

## 2014-11-20 MED ORDER — LIDOCAINE HCL (CARDIAC) 20 MG/ML IV SOLN
INTRAVENOUS | Status: DC | PRN
  Administered 2014-11-20: 100 mg via INTRAVENOUS

## 2014-11-20 SURGICAL SUPPLY — 53 items
APL SKNCLS STERI-STRIP NONHPOA (GAUZE/BANDAGES/DRESSINGS) ×1
ATTRACTOMAT 16X20 MAGNETIC DRP (DRAPES) ×2 IMPLANT
BENZOIN TINCTURE PRP APPL 2/3 (GAUZE/BANDAGES/DRESSINGS) ×2 IMPLANT
BLADE SURG 10 STRL SS (BLADE) ×2 IMPLANT
BLADE SURG 15 STRL LF DISP TIS (BLADE) ×1 IMPLANT
BLADE SURG 15 STRL SS (BLADE) ×2
CANISTER SUCTION 2500CC (MISCELLANEOUS) ×2 IMPLANT
CHLORAPREP W/TINT 26ML (MISCELLANEOUS) ×2 IMPLANT
CLIP TI MEDIUM 6 (CLIP) ×2 IMPLANT
CLIP TI WIDE RED SMALL 6 (CLIP) ×2 IMPLANT
CONT SPEC 4OZ CLIKSEAL STRL BL (MISCELLANEOUS) ×2 IMPLANT
COVER SURGICAL LIGHT HANDLE (MISCELLANEOUS) ×2 IMPLANT
CRADLE DONUT ADULT HEAD (MISCELLANEOUS) ×2 IMPLANT
DRAPE PED LAPAROTOMY (DRAPES) ×2 IMPLANT
DRAPE UTILITY XL STRL (DRAPES) ×2 IMPLANT
ELECT CAUTERY BLADE 6.4 (BLADE) ×2 IMPLANT
ELECT REM PT RETURN 9FT ADLT (ELECTROSURGICAL) ×2
ELECTRODE REM PT RTRN 9FT ADLT (ELECTROSURGICAL) ×1 IMPLANT
GAUZE SPONGE 2X2 8PLY STRL LF (GAUZE/BANDAGES/DRESSINGS) ×1 IMPLANT
GAUZE SPONGE 4X4 16PLY XRAY LF (GAUZE/BANDAGES/DRESSINGS) ×2 IMPLANT
GLOVE BIOGEL PI IND STRL 7.0 (GLOVE) IMPLANT
GLOVE BIOGEL PI INDICATOR 7.0 (GLOVE) ×2
GLOVE SURG ORTHO 8.0 STRL STRW (GLOVE) ×2 IMPLANT
GLOVE SURG SS PI 7.0 STRL IVOR (GLOVE) ×2 IMPLANT
GOWN STRL REUS W/ TWL LRG LVL3 (GOWN DISPOSABLE) ×1 IMPLANT
GOWN STRL REUS W/ TWL XL LVL3 (GOWN DISPOSABLE) ×1 IMPLANT
GOWN STRL REUS W/TWL LRG LVL3 (GOWN DISPOSABLE) ×4
GOWN STRL REUS W/TWL XL LVL3 (GOWN DISPOSABLE) ×2
HEMOSTAT SURGICEL 2X4 FIBR (HEMOSTASIS) ×2 IMPLANT
KIT BASIN OR (CUSTOM PROCEDURE TRAY) ×2 IMPLANT
KIT ROOM TURNOVER OR (KITS) ×2 IMPLANT
NDL HYPO 25GX1X1/2 BEV (NEEDLE) ×1 IMPLANT
NEEDLE HYPO 25GX1X1/2 BEV (NEEDLE) ×2 IMPLANT
NS IRRIG 1000ML POUR BTL (IV SOLUTION) ×2 IMPLANT
PACK SURGICAL SETUP 50X90 (CUSTOM PROCEDURE TRAY) ×2 IMPLANT
PAD ARMBOARD 7.5X6 YLW CONV (MISCELLANEOUS) ×2 IMPLANT
PENCIL BUTTON HOLSTER BLD 10FT (ELECTRODE) ×3 IMPLANT
SPONGE GAUZE 2X2 STER 10/PKG (GAUZE/BANDAGES/DRESSINGS) ×1
SPONGE GAUZE 4X4 12PLY STER LF (GAUZE/BANDAGES/DRESSINGS) ×1 IMPLANT
SPONGE INTESTINAL PEANUT (DISPOSABLE) IMPLANT
STRIP CLOSURE SKIN 1/2X4 (GAUZE/BANDAGES/DRESSINGS) ×2 IMPLANT
SUT MNCRL AB 4-0 PS2 18 (SUTURE) ×2 IMPLANT
SUT SILK 2 0 (SUTURE)
SUT SILK 2-0 18XBRD TIE 12 (SUTURE) IMPLANT
SUT SILK 3 0 (SUTURE)
SUT SILK 3-0 18XBRD TIE 12 (SUTURE) IMPLANT
SUT VIC AB 3-0 SH 18 (SUTURE) ×2 IMPLANT
SYR BULB 3OZ (MISCELLANEOUS) ×2 IMPLANT
SYR CONTROL 10ML LL (SYRINGE) IMPLANT
TAPE CLOTH SURG 4X10 WHT LF (GAUZE/BANDAGES/DRESSINGS) ×1 IMPLANT
TOWEL OR 17X24 6PK STRL BLUE (TOWEL DISPOSABLE) ×2 IMPLANT
TOWEL OR 17X26 10 PK STRL BLUE (TOWEL DISPOSABLE) ×2 IMPLANT
TUBE CONNECTING 12X1/4 (SUCTIONS) ×2 IMPLANT

## 2014-11-20 NOTE — Transfer of Care (Signed)
Immediate Anesthesia Transfer of Care Note  Patient: [name]  Procedure(s) Performed: Procedure(s): RIGHT PARATHYROIDECTOMY (Right)  Patient Location: PACU  Anesthesia Type:General  Level of Consciousness: awake, alert  and patient cooperative  Airway & Oxygen Therapy: Patient Spontanous Breathing and Patient connected to nasal cannula oxygen  Post-op Assessment: Report given to RN, Post -op Vital signs reviewed and stable and Patient moving all extremities  Post vital signs: Reviewed and stable  Last Vitals:  Filed Vitals:   11/20/14 0821  BP: 131/72  Pulse: 89  Temp: 36.8 C  Resp: 20    Complications: No apparent anesthesia complications

## 2014-11-20 NOTE — Anesthesia Postprocedure Evaluation (Signed)
Anesthesia Post Note  Patient: [name]  Procedure(s) Performed: Procedure(s) (LRB): RIGHT PARATHYROIDECTOMY (Right)  Anesthesia type: General  Patient location: PACU  Post pain: Pain level controlled  Post assessment: Post-op Vital signs reviewed  Last Vitals: BP 126/70 mmHg  Pulse 95  Temp(Src) 36.6 C (Oral)  Resp 19  Wt 189 lb (85.73 kg)  SpO2 94%  Post vital signs: Reviewed  Level of consciousness: sedated  Complications: No apparent anesthesia complications

## 2014-11-20 NOTE — Anesthesia Procedure Notes (Signed)
Procedure Name: Intubation Date/Time: 11/20/2014 10:42 AM Performed by: Karl Erway, Jannet Askew Pre-anesthesia Checklist: Patient identified, Timeout performed, Emergency Drugs available, Suction available and Patient being monitored Patient Re-evaluated:Patient Re-evaluated prior to inductionOxygen Delivery Method: Circle system utilized Preoxygenation: Pre-oxygenation with 100% oxygen Intubation Type: IV induction Ventilation: Mask ventilation without difficulty Laryngoscope Size: Mac and 3 Grade View: Grade I Tube type: Oral Tube size: 7.0 mm Number of attempts: 1 Placement Confirmation: ETT inserted through vocal cords under direct vision and positive ETCO2 Secured at: 21 cm Dental Injury: Teeth and Oropharynx as per pre-operative assessment

## 2014-11-20 NOTE — Interval H&P Note (Signed)
History and Physical Interval Note:  11/20/2014 10:14 AM  [name]  has presented today for surgery, with the diagnosis of PRIMARY HYPERPARATHYROIDISM.  The various methods of treatment have been discussed with the patient and family. After consideration of risks, benefits and other options for treatment, the patient has consented to    Procedure(s): RIGHT PARATHYROIDECTOMY (Right) as a surgical intervention .    The patient's history has been reviewed, patient examined, no change in status, stable for surgery.  I have reviewed the patient's chart and labs.  Questions were answered to the patient's satisfaction.    Velora Heckler, MD, FACS General & Endocrine Surgery [facility] Office: [phone]   Dajae Kizer Judie Petit

## 2014-11-20 NOTE — Op Note (Signed)
OPERATIVE REPORT - PARATHYROIDECTOMY  Preoperative diagnosis: Primary hyperparathyroidism  Postop diagnosis: Same  Procedure: Right minimally invasive parathyroidectomy  Surgeon:  Velora Heckler, MD, FACS  Anesthesia: Gen. endotracheal  Estimated blood loss: Minimal  Preparation: ChloraPrep  Indications: The patient is a 69 year old female who presents with a parathyroid neoplasm. Patient presents on referral from her endocrinologist, Dr. Hermelinda Dellen, for evaluation of primary hyperparathyroidism. Patient was initially diagnosed by her primary care physician with hypercalcemia. Laboratory studies dating back to 2009 demonstrate hypercalcemia ranging from 10.6-11.4. Her most recent intact PTH level was elevated at 161. Patient was seen and evaluated by one of my partners. She underwent nuclear medicine parathyroid scan in February 2016. This localized a right inferior parathyroid adenoma.   Procedure: Patient was prepared in the holding area. He was brought to operating room and placed in a supine position on the operating room table. Following administration of general anesthesia, the patient was positioned and then prepped and draped in the usual strict aseptic fashion. After ascertaining that an adequate level of anesthesia been achieved, a neck incision was made with a #15 blade. Dissection was carried through subcutaneous tissues and platysma. Hemostasis was obtained with the electrocautery. Skin flaps were developed circumferentially and a Weitlander retractor was placed for exposure.  Strap muscles were incised in the midline. Strap muscles were reflected exposing the thyroid lobe. With gentle blunt dissection the thyroid lobe was mobilized.  Dissection was carried through adipose tissue and an enlarged parathyroid gland was identified. It was gently mobilized. Vascular structures were divided between small and medium ligaclips. Care was taken to avoid the recurrent laryngeal  nerve and the esophagus. The parathyroid gland was completely excised. It was submitted to pathology where frozen section confirmed parathyroid tissue consistent with adenoma.  Neck was irrigated with warm saline and good hemostasis was noted. Fibrillar was placed in the operative field. Strap muscles were reapproximated in the midline with interrupted 3-0 Vicryl sutures. Platysma was closed with interrupted 3-0 Vicryl sutures. Skin was closed with a running 4-0 Monocryl subcuticular suture. Marcaine was infiltrated circumferentially. Wound was washed and dried and benzoin and Steri-Strips were applied. Sterile gauze dressings were applied. Patient was awakened from anesthesia and brought to the recovery room. The patient tolerated the procedure well.   Velora Heckler, MD, FACS General & Endocrine Surgery [facility]

## 2014-11-20 NOTE — Anesthesia Preprocedure Evaluation (Signed)
Anesthesia Evaluation  Patient identified by MRN, date of birth, ID band Patient awake    Reviewed: Allergy & Precautions, NPO status , Patient's Chart, lab work & pertinent test results  Airway Mallampati: II  TM Distance: >3 FB Neck ROM: Full    Dental no notable dental hx.    Pulmonary neg pulmonary ROS, former smoker,    Pulmonary exam normal breath sounds clear to auscultation       Cardiovascular hypertension, Pt. on medications Normal cardiovascular exam Rhythm:Regular Rate:Normal     Neuro/Psych  Headaches, PSYCHIATRIC DISORDERS Depression    GI/Hepatic negative GI ROS, Neg liver ROS,   Endo/Other  Hypothyroidism   Renal/GU negative Renal ROS     Musculoskeletal negative musculoskeletal ROS (+) Arthritis ,   Abdominal   Peds  Hematology negative hematology ROS (+)   Anesthesia Other Findings   Reproductive/Obstetrics negative OB ROS                             Anesthesia Physical Anesthesia Plan  ASA: II  Anesthesia Plan: General   Post-op Pain Management:    Induction: Intravenous  Airway Management Planned: Oral ETT  Additional Equipment:   Intra-op Plan:   Post-operative Plan: Extubation in OR  Informed Consent: I have reviewed the patients History and Physical, chart, labs and discussed the procedure including the risks, benefits and alternatives for the proposed anesthesia with the patient or authorized representative who has indicated his/her understanding and acceptance.   Dental advisory given  Plan Discussed with: CRNA  Anesthesia Plan Comments:         Anesthesia Quick Evaluation

## 2014-11-21 ENCOUNTER — Encounter (HOSPITAL_COMMUNITY): Payer: Self-pay | Admitting: Surgery

## 2014-11-26 DIAGNOSIS — E210 Primary hyperparathyroidism: Secondary | ICD-10-CM | POA: Diagnosis not present

## 2014-12-25 ENCOUNTER — Ambulatory Visit (INDEPENDENT_AMBULATORY_CARE_PROVIDER_SITE_OTHER): Payer: Commercial Managed Care - HMO | Admitting: Internal Medicine

## 2014-12-25 ENCOUNTER — Encounter: Payer: Self-pay | Admitting: Internal Medicine

## 2014-12-25 VITALS — BP 124/74 | HR 92 | Ht <= 58 in | Wt 191.0 lb

## 2014-12-25 DIAGNOSIS — R05 Cough: Secondary | ICD-10-CM

## 2014-12-25 DIAGNOSIS — R058 Other specified cough: Secondary | ICD-10-CM

## 2014-12-25 DIAGNOSIS — I10 Essential (primary) hypertension: Secondary | ICD-10-CM | POA: Diagnosis not present

## 2014-12-25 MED ORDER — VALSARTAN 80 MG PO TABS: 80.0000 mg | ORAL_TABLET | Freq: Every day | ORAL | Status: DC

## 2014-12-25 NOTE — Progress Notes (Signed)
   Subjective:    Patient ID: [name], female    DOB: 08/31/1945     MRN: [id number]  HPI   [age] yobf with morbid obesity referred by [name] to pulmonary clinic 12/25/2014 for sleep eval    12/25/2014 1st [facility] office visit/ [name]   Chief Complaint  Patient presents with  . Advice Only    refer Dr. Jonny RuizJohn. Pt here to be eval for Sleep apnea. denies any SOB. C/o snoring and can nap during the day  EPWORTH 8  Wakes up feeling refreshed with only about 5-6 hours sleep but if rolls onto back immediate choking sensation assoc with daytime hoarseness and dry cough / urge to clear the throat  No obvious   day to day or daytime variabilty or assoc sob daytime  cp or chest tightness, subjective wheeze overt sinus or hb symptoms. No unusual exp hx or h/o childhood pna/ asthma or knowledge of premature birth.  Sleeping ok without nocturnal  or early am exacerbation  of respiratory  c/o's or need for noct saba. Also denies any obvious fluctuation of symptoms with weather or environmental changes or other aggravating or alleviating factors except as outlined above   Current Medications, Allergies, Complete Past Medical History, Past Surgical History, Family History, and Social History were reviewed in [facility] electronic medical record.              Review of Systems  Constitutional: Negative for fever, chills and unexpected weight change.  HENT: Negative for congestion, dental problem, ear pain, nosebleeds, postnasal drip, rhinorrhea, sinus pressure, sneezing, sore throat, trouble swallowing and voice change.   Eyes: Negative for visual disturbance.  Respiratory: Negative for cough, choking and shortness of breath.   Cardiovascular: Negative for chest pain and leg swelling.  Gastrointestinal: Negative for vomiting, abdominal pain and diarrhea.  Genitourinary: Negative for difficulty urinating.  Musculoskeletal: Positive for joint swelling. Negative for arthralgias.    Skin: Negative for rash.  Neurological: Negative for tremors, syncope and headaches.  Hematological: Does not bruise/bleed easily.       Objective:   Physical Exam  Extremely hoarse bf nad with occ throat clearing  Wt Readings from Last 3 Encounters:  12/25/14 191 lb (86.637 kg)  11/20/14 189 lb (85.73 kg)  11/12/14 189 lb 4.8 oz (85.866 kg)    Vital signs reviewed     HEENT: nl dentition, turbinates, and oropharynx. Nl external ear canals without cough reflex - Modified Mallampati Score = 1    NECK :  without JVD/Nodes/TM/ nl carotid upstrokes bilaterally   LUNGS: no acc muscle use, clear to A and P bilaterally without cough on insp or exp maneuvers   CV:  RRR  no s3 or murmur or increase in P2, no edema   ABD:  soft and nontender with nl excursion in the supine position. No bruits or organomegaly, bowel sounds nl  MS:  warm without deformities, calf tenderness, cyanosis or clubbing  SKIN: warm and dry without lesions    NEURO:  alert, approp, no deficits           Assessment & Plan:

## 2014-12-25 NOTE — Patient Instructions (Addendum)
Stop lisinopril and start diovan (valsartan 80) one daily in its place - let me know if any problem filling this prescription as it's the only way to know how much it's contributing to your symptoms   Stay off your back while sleeping  - if still strangling at bedtime I recommend  pepcid ac 20 mg at bedtime to see if it helps   Please schedule a follow up office visit in 6 weeks, call sooner if needed

## 2014-12-26 ENCOUNTER — Encounter: Payer: Self-pay | Admitting: Internal Medicine

## 2014-12-26 DIAGNOSIS — R05 Cough: Secondary | ICD-10-CM | POA: Insufficient documentation

## 2014-12-26 DIAGNOSIS — R058 Other specified cough: Secondary | ICD-10-CM | POA: Insufficient documentation

## 2014-12-26 NOTE — Assessment & Plan Note (Signed)
Body mass index is 41.32 kg/(m^2).  Lab Results  Component Value Date   TSH 0.50 07/06/2014     Contributing to gerd tendency/ doe/reviewed the need and the process to achieve and maintain neg calorie balance > defer f/u primary care including intermittently monitoring thyroid status

## 2014-12-26 NOTE — Assessment & Plan Note (Signed)
In the best review of chronic cough to date ( NEJM 2016 375 [phone]) ,  ACEi are now felt to cause cough in up to  20% of pts which is a 4 fold increase from previous reports and does not include the variety of non-specific complaints we see in pulmonary clinic in pts on ACEi but previously attributed to copd/asthma to include PNDS, throat and chest congestion, "bronchitis", unexplained dyspnea and noct "strangling" sensations (immitating osa, which she may well also have)  as well as atypical /refractory GERD symptoms like atypical dysphagia.   The only way to prove this is not an "ACEi Case" is a trial off ACEi x a minimum of 6 weeks then regroup.

## 2014-12-26 NOTE — Assessment & Plan Note (Signed)
The throat clearing and hoarseness she demonstrated for me here in the office and nocturnal strangling are much more consistent with upper airway cough syndrome than obstructive sleep apnea and the first step here is to eliminate Ace inhibitors and consider treating her for nocturnal reflux before proceeding  with any form of official/formal sleep study..  I had an extended discussion with the patient reviewing all relevant studies completed to date and  lasting 35 minutes of a 60 minute visit    Each maintenance medication was reviewed in detail including most importantly the difference between maintenance and prns and under what circumstances the prns are to be triggered using an action plan format that is not reflected in the computer generated alphabetically organized AVS.    Please see instructions for details which were reviewed in writing and the patient given a copy highlighting the part that I personally wrote and discussed at today's ov.

## 2015-01-03 ENCOUNTER — Telehealth: Payer: Self-pay | Admitting: Internal Medicine

## 2015-01-03 NOTE — Telephone Encounter (Signed)
Called left vm to schedule AWV with Susan Hauck  °

## 2015-01-14 ENCOUNTER — Ambulatory Visit (INDEPENDENT_AMBULATORY_CARE_PROVIDER_SITE_OTHER): Payer: Commercial Managed Care - HMO

## 2015-01-14 VITALS — BP 120/70 | Ht 59.0 in | Wt 191.0 lb

## 2015-01-14 DIAGNOSIS — Z7189 Other specified counseling: Secondary | ICD-10-CM | POA: Diagnosis not present

## 2015-01-14 DIAGNOSIS — Z0000 Encounter for general adult medical examination without abnormal findings: Secondary | ICD-10-CM | POA: Diagnosis not present

## 2015-01-14 DIAGNOSIS — Z0001 Encounter for general adult medical examination with abnormal findings: Secondary | ICD-10-CM | POA: Insufficient documentation

## 2015-01-14 NOTE — Patient Instructions (Signed)
  [name] , Thank you for taking time to come for your Medicare Wellness Visit. I appreciate your ongoing commitment to your health goals. Please review the following plan we discussed and let me know if I can assist you in the future.   These are the goals we discussed: Goals    . Exercise 150 minutes per week (moderate activity)     [facility]; Will start back next week. Will continue to go to the church 2 days a week and will go to the Y on Tuesdays and Fridays        Declines all vaccines today    This is a list of the screening recommended for you and due dates:  Health Maintenance  Topic Date Due  .  Hepatitis C: One time screening is recommended by Center for Disease Control  (CDC) for  adults born from [birth year] through 1967.   [birth year]  . Shingles Vaccine  08/21/2003  . DEXA scan (bone density measurement)  08/20/2008  . Pneumonia vaccines (1 of 2 - PCV13) 08/20/2008  . Flu Shot  09/17/2014  . Mammogram  08/08/2016  . Tetanus Vaccine  11/19/2019  . Colon Cancer Screening  04/01/2023

## 2015-01-14 NOTE — Addendum Note (Signed)
Addended by: Jeanine LuzALONE, Zayvion Stailey D on: 01/14/2015 09:28 PM   Modules accepted: Level of Service

## 2015-01-14 NOTE — Progress Notes (Addendum)
Subjective:   [name] is a 69 y.o. female who presents for Medicare Annual (Subsequent) preventive examination.  Review of Systems:   Cardiac Risk Factors include: advanced age (>68men, >15 women);dyslipidemia;family history of premature cardiovascular disease;hypertension;obesity (BMI >30kg/m2) HRA assessment completed during visit;  The Patient was informed that this wellness visit is to identify risk and educate on how to reduce risk for increase disease through lifestyle changes.   ROS deferred to CPE exam with physician on Wednesday Did state she has difficulty lifting left arm above her left shoulder, which impacts her ability tom complete her hair; or other ADL's;  States she also has been doing puzzles and other work with head down and feels this may be causing her to have h/a; Pain for h/a starts in lower neck and moves up the back of her neck.  Educated on deep breathing; as well as carrying purse on same side; no loss of sensation or pain in arms; referred to Dr. Jonny Ruiz to evaluate on Wednesday.  Family Hx:  Mother had breast cancer; CHF Dad had stroke and kept him at home;  One sister takes insulin Bother borderline  Medical issues HTN; Pulmonary nodule; Hyperlipidemia;  10/4 lower right parathyroid surgery; fup apt tomorrow;    BMI: 36; States it is Hard to lose weight; Educated on BMI and overall weight and mobility States she does not eat a lot now; HDL 71; trig 76 Diet; Meat, vegetables and a starch; Spouse loves to cook; Cook beef roast;  Potatoes and carrots;  Deserts; homemade banana pudding;  Deserts can be an issue for her  Exercise; (HDL 43) Senior citizen at [employer] and exercises x 2 per week; about an hour Member at [employer]; plans to go back to the [employer] on Tue and thurs and is motivated to do so  Careers adviser reviewed for the home;  clear paths through the home, eliminating clutter, bathroom safety reviewed; community safety; smoke detectors and firearms  safety as well as sun protection when out Driving accidents  No  Medication reviewed and compliant  Fall assessment: last year; fell in apt; getting up and fell; did not get hurt Gait assessment; no issues in getting up and down today  Mobilization and Functional losses in the last year related to neck and left shoulder problems discussed above.  Sleep patterns; sometimes she sleeps; may also be related to neck issues   Urinary or fecal incontinence reviewed/ no urinary or bowel    Counseling: Hepatitis c / deferred to Dr. Jonny Ruiz  Colonoscopy; 03/2013 / high fiber; recall in 10 years; 2025 aged out EKG: 11/12/2014 Hearing:  both ears Dexa; it has been years ago; had hip replacement; Currently on fosamax  Mammagram 08/09/2014 No mammographic evidence of malignancy Ophthalmology exam; Eye exam recommended in May/ went this year; no glaucoma/ have to go back and have them make a pic of back of eye; didn't have money to complete the exam   Immunizations Due and all declined but did educate Zostavax Prevnar 13 Flu  Current Care Team reviewed and updated      Objective:     Vitals: BP 120/70 mmHg  Ht  (1.499 m)  Wt 191 lb (86.637 kg)  BMI 38.56 kg/m2  Tobacco History  Smoking status  . Former Smoker -- 1 years  . Quit date: 02/17/1968  Smokeless tobacco  . Former Neurosurgeon  . Quit date: 11/12/1974    Comment: tried snuff as a child, casual smoker as  young adult     Counseling given: Yes   Past Medical History  Diagnosis Date  . HYPOTHYROIDISM 11/25/2006  . HYPERCHOLESTEROLEMIA 11/25/2006  . HYPERLIPIDEMIA 11/18/2009  . OBESITY 12/04/2006  . DEPRESSION 11/25/2006  . OTITIS MEDIA, ACUTE, LEFT 11/18/2009  . HYPERTENSION 07/15/2007  . PULMONARY NODULE 11/25/2006  . DIVERTICULOSIS, COLON 12/04/2006  . POSTMENOPAUSAL STATUS 11/25/2006  . OSTEOPENIA 11/25/2006  . DIZZINESS 11/18/2009  . PALPITATIONS, RECURRENT 07/20/2008  . HYPERPARATHYROIDISM, HX OF 11/25/2006  .  Vitamin D deficiency   . Allergic rhinitis, cause unspecified 12/26/2010  . Cataract     1 eye-? which  . Headache     uses aleve for headache   . DEGENERATIVE JOINT DISEASE 11/25/2006    in knees   . DJD (degenerative joint disease)     Bilateral Hip   Past Surgical History  Procedure Laterality Date  . Left knee    . Right wrist    . Abdominal hysterectomy    . Total hip arthroplasty      left 3/05, right 2002  . Colonoscopy    . Polypectomy    . Thyroid surgery      early 70's  . Umbilical hernia repair    . Parathyroidectomy Right 11/20/2014    Procedure: RIGHT PARATHYROIDECTOMY;  Surgeon: Darnell Levelodd Gerkin, MD;  [facility];  Service: General;  Laterality: Right;   Family History  Problem Relation Age of Onset  . Hypertension Other   . Breast cancer Mother   . Colon cancer Neg Hx   . Esophageal cancer Neg Hx   . Rectal cancer Neg Hx   . Stomach cancer Neg Hx   . Stroke Father    History  Sexual Activity  . Sexual Activity: Not on file    Outpatient Encounter Prescriptions as of 01/14/2015  Medication Sig  . alendronate (FOSAMAX) 70 MG tablet TAKE 1 TABLET ONE TIME EVERY WEEK  WITH  A  FULL  GLASS  OF  WATER ON AN EMPTY STOMACH  (Patient taking differently: TAKE 1 TABLET ONE TIME EVERY WEEK  (ON SUNDAY MORNINGS) WITH  A  FULL  GLASS  OF  WATER ON AN EMPTY STOMACH)  . aspirin 81 MG EC tablet Take 81 mg by mouth daily.   . cholecalciferol (VITAMIN D) 1000 UNITS tablet Take 1,000 Units by mouth 2 (two) times daily.   Marland Kitchen. levothyroxine (SYNTHROID, LEVOTHROID) 75 MCG tablet TAKE 1 TABLET EVERY DAY  . lovastatin (MEVACOR) 20 MG tablet TAKE 1 TABLET EVERY DAY (Patient taking differently: TAKE 1 TABLET EVERY DAY AFTER SUPPER)  . Naproxen Sodium (ALEVE) 220 MG CAPS Take 440 mg by mouth daily as needed (pain).   Marland Kitchen. OVER THE COUNTER MEDICATION Place 1 drop into both eyes daily as needed (dry eyes). Over the counter lubricating eye drops  . psyllium (HYDROCIL/METAMUCIL) 95 % PACK  Take 1 packet by mouth daily as needed (constipation).   . valsartan (DIOVAN) 80 MG tablet Take 1 tablet (80 mg total) by mouth daily.   No facility-administered encounter medications on file as of 01/14/2015.    Activities of Daily Living In your present state of health, do you have any difficulty performing the following activities: 01/14/2015 11/12/2014  Hearing? N N  Vision? N N  Difficulty concentrating or making decisions? (No Data) N  Walking or climbing stairs? Y Y  Dressing or bathing? N N  Doing errands, shopping? N -  Preparing Food and eating ? N -  Using the  Toilet? N -  In the past six months, have you accidently leaked urine? N -  Do you have problems with loss of bowel control? N -  Managing your Medications? N -  Managing your Finances? N -  Housekeeping or managing your Housekeeping? N -    Patient Care Team: Corwin Levins, MD as PCP - General    Assessment:   Assessment   Patient presents for yearly preventative medicine examination. Medicare questionnaire screening were completed, i.e. Functional; fall risk; depression, memory loss and hearing. MMSE score 26/30; missed 2 math serial 7's from 100; Recall was only 1; no failure of independent living noted; engaged in assessment today; Mood is good; Denies depression; Affect happy;   All immunizations and health maintenance protocols were reviewed with the patient and all vaccines were declined;   Education provided for laboratory screens;    Medication reconciliation, past medical history, social history, problem list and allergies were reviewed in detail with the patient  Goals were established with regard to weight loss, exercise, and diet in compliance with medications based on the patient individualized risk; the patient agrees to exercise more;   End of life planning was discussed;  Reviewed advanced directive and agreed to receipt of information and discussion.  Focused face to face x  20 minutes  discussing HCPOA and Living will and reviewed all the questions in the [facility] forms. The patient voices understanding of HCPOA; LW reviewed and information provided regarding life support as Tube feeds; mechanical ventilation and any changes or special request and how to designate this on the form.   Educated on how to revoke this HCPOA or LW at any time.   Discussed how she would give the HCPOA the  ability to change this living will or not if she cannot speak for himself; as well as finalizing the will by 2 unrelated witnesses and notary.  Will call for questions and given information on [facility] pastoral department for further assistance.    Exercise Activities and Dietary recommendations Current Exercise Habits:: Structured exercise class, Type of exercise: strength training/weights, Time (Minutes): 60, Frequency (Times/Week): 4 (for now, does 2 classes and will increase to 4 next week; has 3 apts this week), Weekly Exercise (Minutes/Week): 240, Intensity: Moderate  Goals    . Exercise 150 minutes per week (moderate activity)     Will go back to the [facility]; Will start back next week. Will continue to go to the church 2 days a week and will go to the Y on Tuesdays and Fridays       Fall Risk Fall Risk  01/14/2015 07/06/2014 12/27/2013 01/06/2013  Falls in the past year? Yes Yes No Yes  Number falls in past yr: 1 1 - 1  Injury with Fall? No No - No  Risk for fall due to : Other (Comment) - - -  Risk for fall due to (comments): accident and tripped - - -  Follow up Education provided;Falls prevention discussed - - -   Depression Screen PHQ 2/9 Scores 01/14/2015 07/06/2014 12/27/2013 01/06/2013  PHQ - 2 Score 0 0 1 1     Cognitive Testing MMSE - Mini Mental State Exam 01/14/2015  Orientation to time 5  Orientation to Place 5  Registration 3  Attention/ Calculation 2  Recall 2  Language- name 2 objects 2  Language- repeat 1  Language- follow 3 step command 3  Language- read &  follow direction 1  Write a sentence 1  Copy design 1  Total score 26    Immunization History  Administered Date(s) Administered  . Td 07/21/1995, 11/18/2009   Screening Tests/ Health Maintenance  Topic Date Due  . Hepatitis C Screening  Jun 04, 1945  . ZOSTAVAX  08/21/2003  . DEXA SCAN  08/20/2008  . PNA vac Low Risk Adult (1 of 2 - PCV13) 08/20/2008  . INFLUENZA VACCINE  09/17/2014  . MAMMOGRAM  08/08/2016  . TETANUS/TDAP  11/19/2019  . COLONOSCOPY  04/01/2023      Plan:     Declines all vaccinations but educated on all.  During the course of the visit the patient was educated and counseled about the following appropriate screening and preventive services:   Vaccines to include Pneumoccal, Influenza, Hepatitis B, Td, Zostavax, HCV/   Educated on hand washing to avoid the flu;  Electrocardiogram 11/12/2014  Cardiovascular Disease/ deferred  Colorectal cancer screening/ 03/2013 / due 2025 and will be aged out  Bone density screening/ currently on meds  Diabetes screening/ Fasting glucose <100  Glaucoma screening/ completed; thinks it was neg;   Mammography completed and no issue  Nutrition counseling ; reviewed and agreed to exercise more  Patient Instructions (the written plan) was given to the patient.   Montine Circle, RN  01/14/2015     Medical screening examination/treatment/procedure(s) were performed by non-physician practitioner and as supervising provider I was immediately available for consultation/collaboration. I agree with above. Jeanine Luz, FNP

## 2015-01-15 ENCOUNTER — Ambulatory Visit: Payer: Commercial Managed Care - HMO | Admitting: Internal Medicine

## 2015-01-16 ENCOUNTER — Ambulatory Visit (INDEPENDENT_AMBULATORY_CARE_PROVIDER_SITE_OTHER): Payer: Commercial Managed Care - HMO | Admitting: Internal Medicine

## 2015-01-16 ENCOUNTER — Encounter: Payer: Self-pay | Admitting: Internal Medicine

## 2015-01-16 ENCOUNTER — Other Ambulatory Visit (INDEPENDENT_AMBULATORY_CARE_PROVIDER_SITE_OTHER): Payer: Commercial Managed Care - HMO

## 2015-01-16 VITALS — BP 118/74 | HR 93 | Temp 98.6°F | Ht 59.0 in | Wt 193.0 lb

## 2015-01-16 DIAGNOSIS — I10 Essential (primary) hypertension: Secondary | ICD-10-CM | POA: Diagnosis not present

## 2015-01-16 DIAGNOSIS — F32A Depression, unspecified: Secondary | ICD-10-CM

## 2015-01-16 DIAGNOSIS — R1900 Intra-abdominal and pelvic swelling, mass and lump, unspecified site: Secondary | ICD-10-CM

## 2015-01-16 DIAGNOSIS — Z0000 Encounter for general adult medical examination without abnormal findings: Secondary | ICD-10-CM | POA: Diagnosis not present

## 2015-01-16 DIAGNOSIS — M545 Low back pain, unspecified: Secondary | ICD-10-CM | POA: Insufficient documentation

## 2015-01-16 DIAGNOSIS — R971 Elevated cancer antigen 125 [CA 125]: Secondary | ICD-10-CM | POA: Diagnosis not present

## 2015-01-16 DIAGNOSIS — F329 Major depressive disorder, single episode, unspecified: Secondary | ICD-10-CM

## 2015-01-16 LAB — URINALYSIS, ROUTINE W REFLEX MICROSCOPIC
Bilirubin Urine: NEGATIVE
HGB URINE DIPSTICK: NEGATIVE
Ketones, ur: NEGATIVE
Leukocytes, UA: NEGATIVE
NITRITE: NEGATIVE
RBC / HPF: NONE SEEN (ref 0–?)
Specific Gravity, Urine: 1.015 (ref 1.000–1.030)
Total Protein, Urine: NEGATIVE
URINE GLUCOSE: NEGATIVE
Urobilinogen, UA: 0.2 (ref 0.0–1.0)
pH: 6 (ref 5.0–8.0)

## 2015-01-16 LAB — LIPID PANEL
CHOLESTEROL: 179 mg/dL (ref 0–200)
HDL: 56.2 mg/dL (ref >39.00)
LDL CALC: 111 mg/dL — AB (ref 0–99)
NonHDL: 123.18
TRIGLYCERIDES: 61 mg/dL (ref 0.0–149.0)
Total CHOL/HDL Ratio: 3
VLDL: 12.2 mg/dL (ref 0.0–40.0)

## 2015-01-16 LAB — CBC WITH DIFFERENTIAL/PLATELET
Basophils Absolute: 0 10*3/uL (ref 0.0–0.1)
Basophils Relative: 0.7 % (ref 0.0–3.0)
Eosinophils Absolute: 0.1 10*3/uL (ref 0.0–0.7)
Eosinophils Relative: 3.4 % (ref 0.0–5.0)
HEMATOCRIT: 38.3 % (ref 36.0–46.0)
HEMOGLOBIN: 12.7 g/dL (ref 12.0–15.0)
LYMPHS ABS: 0.7 10*3/uL (ref 0.7–4.0)
Lymphocytes Relative: 21.1 % (ref 12.0–46.0)
MCHC: 33.2 g/dL (ref 30.0–36.0)
MCV: 87.7 fl (ref 78.0–100.0)
MONOS PCT: 9.5 % (ref 3.0–12.0)
Monocytes Absolute: 0.3 10*3/uL (ref 0.1–1.0)
NEUTROS ABS: 2.1 10*3/uL (ref 1.4–7.7)
Neutrophils Relative %: 65.3 % (ref 43.0–77.0)
Platelets: 258 10*3/uL (ref 150.0–400.0)
RBC: 4.36 Mil/uL (ref 3.87–5.11)
RDW: 13.6 % (ref 11.5–15.5)
WBC: 3.3 10*3/uL — ABNORMAL LOW (ref 4.0–10.5)

## 2015-01-16 LAB — BASIC METABOLIC PANEL
BUN: 12 mg/dL (ref 6–23)
CHLORIDE: 108 meq/L (ref 96–112)
CO2: 26 mEq/L (ref 19–32)
Calcium: 8.8 mg/dL (ref 8.4–10.5)
Creatinine, Ser: 0.65 mg/dL (ref 0.40–1.20)
GFR: 115.42 mL/min (ref >60.00)
GLUCOSE: 82 mg/dL (ref 70–99)
POTASSIUM: 4.4 meq/L (ref 3.5–5.1)
SODIUM: 141 meq/L (ref 135–145)

## 2015-01-16 LAB — HEPATIC FUNCTION PANEL
ALBUMIN: 3.9 g/dL (ref 3.5–5.2)
ALK PHOS: 50 U/L (ref 39–117)
ALT: 10 U/L (ref 0–35)
AST: 16 U/L (ref 0–37)
Bilirubin, Direct: 0.1 mg/dL (ref 0.0–0.3)
TOTAL PROTEIN: 7.4 g/dL (ref 6.0–8.3)
Total Bilirubin: 0.4 mg/dL (ref 0.2–1.2)

## 2015-01-16 LAB — TSH: TSH: 0.61 u[IU]/mL (ref 0.35–4.50)

## 2015-01-16 MED ORDER — VALSARTAN 80 MG PO TABS: 80.0000 mg | ORAL_TABLET | Freq: Every day | ORAL | Status: DC

## 2015-01-16 NOTE — Assessment & Plan Note (Signed)

## 2015-01-16 NOTE — Progress Notes (Signed)
Subjective:    Patient ID: [name], female    DOB: 04/12/1945, 69 y.o.   MRN: [id number]  HPI  Here for wellness and f/u;  Overall doing ok;  Pt denies Chest pain, worsening SOB, DOE, wheezing, orthopnea, PND, worsening LE edema, palpitations, dizziness or syncope.  Pt denies neurological change such as new headache, facial or extremity weakness.  Pt denies polydipsia, polyuria, or low sugar symptoms. Pt states overall good compliance with treatment and medications, good tolerability, and has been trying to follow appropriate diet.  Pt denies worsening depressive symptoms, suicidal ideation or panic. No fever, night sweats, wt loss, loss of appetite, or other constitutional symptoms.  Pt states good ability with ADL's, has low fall risk, home safety reviewed and adequate, no other significant changes in hearing or vision, and only occasionally active with exercise. Denies worsening reflux, abd pain, dysphagia, n/v, bowel change or blood.  S/p TAH, ovaries intact per pt. Declines immunizations.  ACEI changed to diovan.  Voice improved but not 100% yet. Denies urinary symptoms such as dysuria, frequency, urgency, flank pain, hematuria or n/v, fever, chills. Past Medical History  Diagnosis Date  . HYPOTHYROIDISM 11/25/2006  . HYPERCHOLESTEROLEMIA 11/25/2006  . HYPERLIPIDEMIA 11/18/2009  . OBESITY 12/04/2006  . DEPRESSION 11/25/2006  . OTITIS MEDIA, ACUTE, LEFT 11/18/2009  . HYPERTENSION 07/15/2007  . PULMONARY NODULE 11/25/2006  . DIVERTICULOSIS, COLON 12/04/2006  . POSTMENOPAUSAL STATUS 11/25/2006  . OSTEOPENIA 11/25/2006  . DIZZINESS 11/18/2009  . PALPITATIONS, RECURRENT 07/20/2008  . HYPERPARATHYROIDISM, HX OF 11/25/2006  . Vitamin D deficiency   . Allergic rhinitis, cause unspecified 12/26/2010  . Cataract     1 eye-? which  . Headache     uses aleve for headache   . DEGENERATIVE JOINT DISEASE 11/25/2006    in knees   . DJD (degenerative joint disease)     Bilateral Hip   Past Surgical  History  Procedure Laterality Date  . Left knee    . Right wrist    . Abdominal hysterectomy    . Total hip arthroplasty      left 3/05, right 2002  . Colonoscopy    . Polypectomy    . Thyroid surgery      early 70's  . Umbilical hernia repair    . Parathyroidectomy Right 11/20/2014    Procedure: RIGHT PARATHYROIDECTOMY;  Surgeon: Darnell Levelodd Gerkin, MD;  [facility];  Service: General;  Laterality: Right;    reports that she quit smoking about 46 years ago. She quit smokeless tobacco use about 40 years ago. She reports that she does not drink alcohol or use illicit drugs. family history includes Breast cancer in her mother; Hypertension in her other; Stroke in her father. There is no history of Colon cancer, Esophageal cancer, Rectal cancer, or Stomach cancer. No Known Allergies Current Outpatient Prescriptions on File Prior to Visit  Medication Sig Dispense Refill  . alendronate (FOSAMAX) 70 MG tablet TAKE 1 TABLET ONE TIME EVERY WEEK  WITH  A  FULL  GLASS  OF  WATER ON AN EMPTY STOMACH  (Patient taking differently: TAKE 1 TABLET ONE TIME EVERY WEEK  (ON SUNDAY MORNINGS) WITH  A  FULL  GLASS  OF  WATER ON AN EMPTY STOMACH) 12 tablet 3  . aspirin 81 MG EC tablet Take 81 mg by mouth daily.     . cholecalciferol (VITAMIN D) 1000 UNITS tablet Take 1,000 Units by mouth 2 (two) times daily.     Marland Kitchen. levothyroxine (  SYNTHROID, LEVOTHROID) 75 MCG tablet TAKE 1 TABLET EVERY DAY 90 tablet 3  . lovastatin (MEVACOR) 20 MG tablet TAKE 1 TABLET EVERY DAY (Patient taking differently: TAKE 1 TABLET EVERY DAY AFTER SUPPER) 90 tablet 3  . Naproxen Sodium (ALEVE) 220 MG CAPS Take 440 mg by mouth daily as needed (pain).     Marland Kitchen OVER THE COUNTER MEDICATION Place 1 drop into both eyes daily as needed (dry eyes). Over the counter lubricating eye drops    . psyllium (HYDROCIL/METAMUCIL) 95 % PACK Take 1 packet by mouth daily as needed (constipation).      No current facility-administered medications on file prior to  visit.    Review of Systems Constitutional: Negative for increased diaphoresis, other activity, appetite or siginficant weight change other than noted HENT: Negative for worsening hearing loss, ear pain, facial swelling, mouth sores and neck stiffness.   Eyes: Negative for other worsening pain, redness or visual disturbance.  Respiratory: Negative for shortness of breath and wheezing  Cardiovascular: Negative for chest pain and palpitations.  Gastrointestinal: Negative for diarrhea, blood in stool, abdominal distention or other pain Genitourinary: Negative for hematuria, flank pain or change in urine volume.  Musculoskeletal: Negative for myalgias or other joint complaints.  Skin: Negative for color change and wound or drainage.  Neurological: Negative for syncope and numbness. other than noted Hematological: Negative for adenopathy. or other swelling Psychiatric/Behavioral: Negative for hallucinations, SI, self-injury, decreased concentration or other worsening agitation.      Objective:   Physical Exam BP 118/74 mmHg  Pulse 93  Temp(Src) 98.6 F (37 C) (Oral)  Ht  (1.499 m)  Wt 193 lb (87.544 kg)  BMI 38.96 kg/m2  SpO2 97% VS noted,  Constitutional: Pt is oriented to person, place, and time. Appears well-developed and well-nourished, in no significant distress Head: Normocephalic and atraumatic.  Right Ear: External ear normal.  Left Ear: External ear normal.  Nose: Nose normal.  Mouth/Throat: Oropharynx is clear and moist.  Eyes: Conjunctivae and EOM are normal. Pupils are equal, round, and reactive to light.  Neck: Normal range of motion. Neck supple. No JVD present. No tracheal deviation present or significant neck LA or mass Cardiovascular: Normal rate, regular rhythm, normal heart sounds and intact distal pulses.   Pulmonary/Chest: Effort normal and breath sounds without rales or wheezing  Abdominal: Soft. Bowel sounds are normal. NT. No HSM but has vague firmness  to low mid abd and LLQ - ? Mass like (s/p tah, pt denies full bladder) Musculoskeletal: Normal range of motion. Exhibits no edema.  Lymphadenopathy:  Has no cervical adenopathy.  Neurological: Pt is alert and oriented to person, place, and time. Pt has normal reflexes. No cranial nerve deficit. Motor grossly intact Skin: Skin is warm and dry. No rash noted.  Psychiatric:  Has normal mood and affect. Behavior is normal. not depressed affect    Assessment & Plan:

## 2015-01-16 NOTE — Patient Instructions (Signed)
Please continue all other medications as before, and refills have been done if requested.  Please have the pharmacy call with any other refills you may need.  Please continue your efforts at being more active, low cholesterol diet, and weight control.  You are otherwise up to date with prevention measures today.  Please keep your appointments with your specialists as you may have planned  You will be contacted regarding the referral for: Transvaginal Ultrasound with Pelvic ultrasound  Please go to the LAB in the Basement (turn left off the elevator) for the tests to be done today  You will be contacted by phone if any changes need to be made immediately.  Otherwise, you will receive a letter about your results with an explanation, but please check with MyChart first.  Please remember to sign up for MyChart if you have not done so, as this will be important to you in the future with finding out test results, communicating by private email, and scheduling acute appointments online when needed.  Please return in 6 months, or sooner if needed

## 2015-01-16 NOTE — Assessment & Plan Note (Signed)
stable overall by history and exam, recent data reviewed with pt, and pt to continue medical treatment as before,  to f/u any worsening symptoms or concerns BP Readings from Last 3 Encounters:  01/16/15 118/74  01/14/15 120/70  12/25/14 124/74

## 2015-01-16 NOTE — Assessment & Plan Note (Addendum)
?   Significance, low mid abd and LLQ - ? Ovary enlargment, no pain or tender, denies GI or GU symptoms, will have Transvag U/S, pelvic u/s to further assess, may need GYN or GYN oncology if ovary enlargement confirmed, also for ca-125

## 2015-01-16 NOTE — Progress Notes (Signed)
Pre visit review using our clinic review tool, if applicable. No additional management support is needed unless otherwise documented below in the visit note. 

## 2015-01-16 NOTE — Assessment & Plan Note (Signed)
stable overall by history and exam, recent data reviewed with pt, and pt to continue medical treatment as before,  to f/u any worsening symptoms or concerns Lab Results  Component Value Date   WBC 3.3* 11/12/2014   HGB 12.7 11/12/2014   HCT 38.9 11/12/2014   PLT 238 11/12/2014   GLUCOSE 83 11/12/2014   CHOL 169 07/06/2014   TRIG 56.0 07/06/2014   HDL 62.80 07/06/2014   LDLDIRECT 118.5 01/01/2012   LDLCALC 95 07/06/2014   ALT 9 07/06/2014   AST 16 07/06/2014   NA 140 11/12/2014   K 4.0 11/12/2014   CL 107 11/12/2014   CREATININE 0.70 11/12/2014   BUN 8 11/12/2014   CO2 26 11/12/2014   TSH 0.50 07/06/2014

## 2015-01-17 ENCOUNTER — Encounter: Payer: Self-pay | Admitting: Internal Medicine

## 2015-01-17 LAB — CA 125: CA 125: 6 U/mL (ref <35)

## 2015-01-22 ENCOUNTER — Ambulatory Visit
Admission: RE | Admit: 2015-01-22 | Discharge: 2015-01-22 | Disposition: A | Discharge status: Home / Self Care | Payer: Commercial Managed Care - HMO | Source: Ambulatory Visit | Attending: Internal Medicine | Admitting: Internal Medicine

## 2015-01-22 DIAGNOSIS — R1900 Intra-abdominal and pelvic swelling, mass and lump, unspecified site: Secondary | ICD-10-CM

## 2015-01-22 DIAGNOSIS — Z0389 Encounter for observation for other suspected diseases and conditions ruled out: Secondary | ICD-10-CM | POA: Diagnosis not present

## 2015-01-23 ENCOUNTER — Encounter: Payer: Self-pay | Admitting: Internal Medicine

## 2015-01-23 ENCOUNTER — Other Ambulatory Visit: Payer: Self-pay | Admitting: Internal Medicine

## 2015-01-23 DIAGNOSIS — R1900 Intra-abdominal and pelvic swelling, mass and lump, unspecified site: Secondary | ICD-10-CM

## 2015-02-05 ENCOUNTER — Ambulatory Visit (INDEPENDENT_AMBULATORY_CARE_PROVIDER_SITE_OTHER): Payer: Commercial Managed Care - HMO | Admitting: Internal Medicine

## 2015-02-05 ENCOUNTER — Encounter: Payer: Self-pay | Admitting: Internal Medicine

## 2015-02-05 VITALS — BP 118/72 | HR 87 | Ht 59.0 in | Wt 191.0 lb

## 2015-02-05 DIAGNOSIS — I10 Essential (primary) hypertension: Secondary | ICD-10-CM

## 2015-02-05 DIAGNOSIS — R05 Cough: Secondary | ICD-10-CM | POA: Diagnosis not present

## 2015-02-05 DIAGNOSIS — R058 Other specified cough: Secondary | ICD-10-CM

## 2015-02-05 NOTE — Patient Instructions (Signed)
No change in your medications   Return here for coughing or loss of voice and work that weight    If you are satisfied with your treatment plan,  let your doctor know and he  can either refill your medications or you can return here when your prescription runs out (prefer to let [name] do this when your valsartan runs out)    If in any way you are not 100% satisfied,  please tell us.  If 100% better, tell your friends!  Pulmonary follow up is as needed

## 2015-02-05 NOTE — Assessment & Plan Note (Signed)
Trial off acei 12/25/2014 > resolved off acei at f/u ov 02/05/2015 > no pulmonary f/u needed

## 2015-02-05 NOTE — Assessment & Plan Note (Signed)
Trial off acei 12/25/2014 due to noct choking > resolved on ARB as of 02/05/2015   Adequate control on present rx, reviewed > no change in rx needed  > would avoid acei/ Follow up per Primary Care planned

## 2015-02-05 NOTE — Progress Notes (Signed)
Subjective:    Patient ID: [name], female    DOB: [birth year]     MRN: [id number]     Brief patient profile:  69 yobf with morbid obesity referred by Dr Jonny Ruiz to pulmonary clinic 12/25/2014 for sleep eval     History of Present Illness  12/25/2014 1st [facility] office visit/ [name]   Chief Complaint  Patient presents with  . Advice Only    refer Dr. Jonny Ruiz. Pt here to be eval for Sleep apnea. denies any SOB. C/o snoring and can nap during the day  EPWORTH 8  Wakes up feeling refreshed with only about 5-6 hours sleep but if rolls onto back immediate choking sensation assoc with daytime hoarseness and dry cough / urge to clear the throat rec Stop lisinopril and start diovan (valsartan 80) one daily in its place - let me know if any problem filling this prescription as it's the only way to know how much it's contributing to your symptoms  Stay off your back while sleeping  - if still strangling at bedtime I recommend  pepcid ac 20 mg at bedtime to see if it helps    02/05/2015  f/u ov/[name] re:  Noct strangling resolved off acei  Chief Complaint  Patient presents with  . Follow-up    pt following for upper airway cough syndrome: pt states she is doing very well since she last been here, the medication helped her and her voice is back. no concerns at this time.    Sleeping very well now, never took noct h2   No obvious day to day or daytime variability or assoc chronic cough or cp or chest tightness, subjective wheeze or overt sinus or hb symptoms. No unusual exp hx or h/o childhood pna/ asthma or knowledge of premature birth.  Sleeping ok without nocturnal  or early am exacerbation  of respiratory  c/o's or need for noct saba. Also denies any obvious fluctuation of symptoms with weather or environmental changes or other aggravating or alleviating factors except as outlined above   Current Medications, Allergies, Complete Past Medical History, Past Surgical History, Family  History, and Social History were reviewed in [employer] record.  ROS  The following are not active complaints unless bolded sore throat, dysphagia, dental problems, itching, sneezing,  nasal congestion or excess/ purulent secretions, ear ache,   fever, chills, sweats, unintended wt loss, classically pleuritic or exertional cp, hemoptysis,  orthopnea pnd or leg swelling, presyncope, palpitations, abdominal pain, anorexia, nausea, vomiting, diarrhea  or change in bowel or bladder habits, change in stools or urine, dysuria,hematuria,  rash, arthralgias, visual complaints, headache, numbness, weakness or ataxia or problems with walking or coordination,  change in mood/affect or memory.                             Objective:   Physical Exam  Pleasant bf nad  Good phonation   02/05/2015      191    12/25/14 191 lb (86.637 kg)  11/20/14 189 lb (85.73 kg)  11/12/14 189 lb 4.8 oz (85.866 kg)    Vital signs reviewed     HEENT: nl dentition, turbinates, and oropharynx. Nl external ear canals without cough reflex - Modified Mallampati Score = 1    NECK :  without JVD/Nodes/TM/ nl carotid upstrokes bilaterally   LUNGS: no acc muscle use, clear to A and P bilaterally without cough on insp or  exp maneuvers   CV:  RRR  no s3 or murmur or increase in P2, no edema   ABD:  soft and nontender with nl excursion in the supine position. No bruits or organomegaly, bowel sounds nl  MS:  warm without deformities, calf tenderness, cyanosis or clubbing  SKIN: warm and dry without lesions    NEURO:  alert, approp, no deficits        No recent cxr on file      Assessment & Plan:

## 2015-02-06 ENCOUNTER — Encounter: Payer: Self-pay | Admitting: Internal Medicine

## 2015-02-06 ENCOUNTER — Ambulatory Visit (INDEPENDENT_AMBULATORY_CARE_PROVIDER_SITE_OTHER)
Admission: RE | Admit: 2015-02-06 | Discharge: 2015-02-06 | Disposition: A | Discharge status: Home / Self Care | Payer: Commercial Managed Care - HMO | Source: Ambulatory Visit | Attending: Internal Medicine | Admitting: Internal Medicine

## 2015-02-06 DIAGNOSIS — R1900 Intra-abdominal and pelvic swelling, mass and lump, unspecified site: Secondary | ICD-10-CM

## 2015-02-06 DIAGNOSIS — R1904 Left lower quadrant abdominal swelling, mass and lump: Secondary | ICD-10-CM | POA: Diagnosis not present

## 2015-02-06 MED ORDER — IOHEXOL 300 MG/ML  SOLN
100.0000 mL | Freq: Once | INTRAMUSCULAR | Status: AC | PRN
  Administered 2015-02-06: 100 mL via INTRAVENOUS

## 2015-02-20 ENCOUNTER — Telehealth: Payer: Self-pay | Admitting: Internal Medicine

## 2015-02-20 MED ORDER — ALENDRONATE SODIUM 70 MG PO TABS: ORAL_TABLET | ORAL | Status: DC

## 2015-02-20 NOTE — Telephone Encounter (Signed)
Pt request refill for alendronate (FOSAMAX) 70 MG tablet to be send to [facility] pharmacy.

## 2015-03-18 ENCOUNTER — Other Ambulatory Visit: Payer: Self-pay | Admitting: Internal Medicine

## 2015-07-16 ENCOUNTER — Other Ambulatory Visit (INDEPENDENT_AMBULATORY_CARE_PROVIDER_SITE_OTHER): Payer: Commercial Managed Care - HMO

## 2015-07-16 ENCOUNTER — Encounter: Payer: Self-pay | Admitting: Internal Medicine

## 2015-07-16 ENCOUNTER — Ambulatory Visit (INDEPENDENT_AMBULATORY_CARE_PROVIDER_SITE_OTHER): Payer: Commercial Managed Care - HMO | Admitting: Internal Medicine

## 2015-07-16 VITALS — BP 124/82 | HR 103 | Temp 98.1°F | Resp 20 | Wt 195.0 lb

## 2015-07-16 DIAGNOSIS — R6889 Other general symptoms and signs: Secondary | ICD-10-CM

## 2015-07-16 DIAGNOSIS — M25512 Pain in left shoulder: Secondary | ICD-10-CM | POA: Diagnosis not present

## 2015-07-16 DIAGNOSIS — M542 Cervicalgia: Secondary | ICD-10-CM

## 2015-07-16 DIAGNOSIS — Z1159 Encounter for screening for other viral diseases: Secondary | ICD-10-CM

## 2015-07-16 DIAGNOSIS — I10 Essential (primary) hypertension: Secondary | ICD-10-CM

## 2015-07-16 DIAGNOSIS — Z0001 Encounter for general adult medical examination with abnormal findings: Secondary | ICD-10-CM | POA: Diagnosis not present

## 2015-07-16 DIAGNOSIS — E785 Hyperlipidemia, unspecified: Secondary | ICD-10-CM | POA: Diagnosis not present

## 2015-07-16 DIAGNOSIS — E039 Hypothyroidism, unspecified: Secondary | ICD-10-CM

## 2015-07-16 LAB — HEPATIC FUNCTION PANEL
ALBUMIN: 4.1 g/dL (ref 3.5–5.2)
ALT: 10 U/L (ref 0–35)
AST: 15 U/L (ref 0–37)
Alkaline Phosphatase: 37 U/L — ABNORMAL LOW (ref 39–117)
BILIRUBIN TOTAL: 0.4 mg/dL (ref 0.2–1.2)
Bilirubin, Direct: 0.1 mg/dL (ref 0.0–0.3)
TOTAL PROTEIN: 7.2 g/dL (ref 6.0–8.3)

## 2015-07-16 LAB — BASIC METABOLIC PANEL
BUN: 12 mg/dL (ref 6–23)
CHLORIDE: 106 meq/L (ref 96–112)
CO2: 30 mEq/L (ref 19–32)
CREATININE: 0.67 mg/dL (ref 0.40–1.20)
Calcium: 9.1 mg/dL (ref 8.4–10.5)
GFR: 111.3 mL/min (ref >60.00)
GLUCOSE: 76 mg/dL (ref 70–99)
Potassium: 4.2 mEq/L (ref 3.5–5.1)
Sodium: 140 mEq/L (ref 135–145)

## 2015-07-16 LAB — CBC WITH DIFFERENTIAL/PLATELET
BASOS ABS: 0.1 10*3/uL (ref 0.0–0.1)
Basophils Relative: 1.5 % (ref 0.0–3.0)
EOS ABS: 0.1 10*3/uL (ref 0.0–0.7)
Eosinophils Relative: 3.4 % (ref 0.0–5.0)
HEMATOCRIT: 38.5 % (ref 36.0–46.0)
HEMOGLOBIN: 12.7 g/dL (ref 12.0–15.0)
LYMPHS PCT: 21.8 % (ref 12.0–46.0)
Lymphs Abs: 0.8 10*3/uL (ref 0.7–4.0)
MCHC: 32.9 g/dL (ref 30.0–36.0)
MCV: 87.5 fl (ref 78.0–100.0)
Monocytes Absolute: 0.4 10*3/uL (ref 0.1–1.0)
Monocytes Relative: 12.1 % — ABNORMAL HIGH (ref 3.0–12.0)
Neutro Abs: 2.3 10*3/uL (ref 1.4–7.7)
Neutrophils Relative %: 61.2 % (ref 43.0–77.0)
Platelets: 249 10*3/uL (ref 150.0–400.0)
RBC: 4.41 Mil/uL (ref 3.87–5.11)
RDW: 14.3 % (ref 11.5–15.5)
WBC: 3.7 10*3/uL — AB (ref 4.0–10.5)

## 2015-07-16 LAB — URINALYSIS, ROUTINE W REFLEX MICROSCOPIC
Bilirubin Urine: NEGATIVE
HGB URINE DIPSTICK: NEGATIVE
Ketones, ur: NEGATIVE
Leukocytes, UA: NEGATIVE
NITRITE: NEGATIVE
RBC / HPF: NONE SEEN (ref 0–?)
SPECIFIC GRAVITY, URINE: 1.015 (ref 1.000–1.030)
TOTAL PROTEIN, URINE-UPE24: NEGATIVE
URINE GLUCOSE: NEGATIVE
Urobilinogen, UA: 0.2 (ref 0.0–1.0)
pH: 5.5 (ref 5.0–8.0)

## 2015-07-16 LAB — LIPID PANEL
CHOL/HDL RATIO: 3
Cholesterol: 178 mg/dL (ref 0–200)
HDL: 57.5 mg/dL (ref >39.00)
LDL Cholesterol: 109 mg/dL — ABNORMAL HIGH (ref 0–99)
NonHDL: 120.67
TRIGLYCERIDES: 59 mg/dL (ref 0.0–149.0)
VLDL: 11.8 mg/dL (ref 0.0–40.0)

## 2015-07-16 LAB — TSH: TSH: 0.61 u[IU]/mL (ref 0.35–4.50)

## 2015-07-16 NOTE — Patient Instructions (Signed)

## 2015-07-16 NOTE — Progress Notes (Signed)
Subjective:    Patient ID: [name], female    DOB: 05/13/1945, 70 y.o.   MRN: [id number]  HPI  Here for wellness and f/u;  Overall doing ok;  Pt denies Chest pain, worsening SOB, DOE, wheezing, orthopnea, PND, worsening LE edema, palpitations, dizziness or syncope.  Pt denies neurological change such as new facial or extremity weakness.  Pt denies polydipsia, polyuria, or low sugar symptoms. Pt states overall good compliance with treatment and medications, good tolerability, and has been trying to follow appropriate diet.  Pt denies worsening depressive symptoms, suicidal ideation or panic. No fever, night sweats, wt loss, loss of appetite, or other constitutional symptoms.  Pt states good ability with ADL's, has low fall risk, home safety reviewed and adequate, no other significant changes in hearing or vision, and only occasionally active with exercise. Declines pneumonia shots.  Has had several HA's in recent months, sharp , starts at lower neck/upper back, radiates to the occipital area and crown of head, mild to mod but worse recently and more frequent, Worse to lie down at night. Alleve can help for a day, then comes back. No UE radicular pain  Does also have persistent left shoulder sharp pain for weeks, and reduced ROM so cant do the hair as before. No LBP or LE symtpoms.  Walks with cane, no recent falls.  Past Medical History  Diagnosis Date  . HYPOTHYROIDISM 11/25/2006  . HYPERCHOLESTEROLEMIA 11/25/2006  . HYPERLIPIDEMIA 11/18/2009  . OBESITY 12/04/2006  . DEPRESSION 11/25/2006  . OTITIS MEDIA, ACUTE, LEFT 11/18/2009  . HYPERTENSION 07/15/2007  . PULMONARY NODULE 11/25/2006  . DIVERTICULOSIS, COLON 12/04/2006  . POSTMENOPAUSAL STATUS 11/25/2006  . OSTEOPENIA 11/25/2006  . DIZZINESS 11/18/2009  . PALPITATIONS, RECURRENT 07/20/2008  . HYPERPARATHYROIDISM, HX OF 11/25/2006  . Vitamin D deficiency   . Allergic rhinitis, cause unspecified 12/26/2010  . Cataract     1 eye-? which  .  Headache     uses aleve for headache   . DEGENERATIVE JOINT DISEASE 11/25/2006    in knees   . DJD (degenerative joint disease)     Bilateral Hip   Past Surgical History  Procedure Laterality Date  . Left knee    . Right wrist    . Abdominal hysterectomy    . Total hip arthroplasty      left 3/05, right 2002  . Colonoscopy    . Polypectomy    . Thyroid surgery      early 70's  . Umbilical hernia repair    . Parathyroidectomy Right 11/20/2014    Procedure: RIGHT PARATHYROIDECTOMY;  Surgeon: Darnell Levelodd Gerkin, MD;  [facility];  Service: General;  Laterality: Right;    reports that she quit smoking about 47 years ago. She quit smokeless tobacco use about 40 years ago. She reports that she does not drink alcohol or use illicit drugs. family history includes Breast cancer in her mother; Hypertension in her other; Stroke in her father. There is no history of Colon cancer, Esophageal cancer, Rectal cancer, or Stomach cancer. No Known Allergies Current Outpatient Prescriptions on File Prior to Visit  Medication Sig Dispense Refill  . alendronate (FOSAMAX) 70 MG tablet TAKE 1 TABLET ONE TIME EVERY WEEK  (ON SUNDAY MORNINGS) WITH  A  FULL  GLASS  OF  WATER ON AN EMPTY STOMACH 12 tablet 1  . aspirin 81 MG EC tablet Take 81 mg by mouth daily.     . cholecalciferol (VITAMIN D) 1000 UNITS tablet Take  1,000 Units by mouth 2 (two) times daily.     Marland Kitchen levothyroxine (SYNTHROID, LEVOTHROID) 75 MCG tablet TAKE 1 TABLET EVERY DAY 90 tablet 3  . lovastatin (MEVACOR) 20 MG tablet TAKE 1 TABLET EVERY DAY 90 tablet 3  . Naproxen Sodium (ALEVE) 220 MG CAPS Take 440 mg by mouth daily as needed (pain).     Marland Kitchen OVER THE COUNTER MEDICATION Place 1 drop into both eyes daily as needed (dry eyes). Over the counter lubricating eye drops    . psyllium (HYDROCIL/METAMUCIL) 95 % PACK Take 1 packet by mouth daily as needed (constipation).     . valsartan (DIOVAN) 80 MG tablet Take 1 tablet (80 mg total) by mouth daily. 90  tablet 3   No current facility-administered medications on file prior to visit.   Review of Systems Constitutional: Negative for increased diaphoresis, or other activity, appetite or siginficant weight change other than noted HENT: Negative for worsening hearing loss, ear pain, facial swelling, mouth sores and neck stiffness.   Eyes: Negative for other worsening pain, redness or visual disturbance.  Respiratory: Negative for choking or stridor Cardiovascular: Negative for other chest pain and palpitations.  Gastrointestinal: Negative for worsening diarrhea, blood in stool, or abdominal distention Genitourinary: Negative for hematuria, flank pain or change in urine volume.  Musculoskeletal: Negative for myalgias or other joint complaints.  Skin: Negative for other color change and wound or drainage.  Neurological: Negative for syncope and numbness. other than noted Hematological: Negative for adenopathy. or other swelling Psychiatric/Behavioral: Negative for hallucinations, SI, self-injury, decreased concentration or other worsening agitation.      Objective:   Physical Exam BP 124/82 mmHg  Pulse 103  Temp(Src) 98.1 F (36.7 C) (Oral)  Resp 20  Wt 195 lb (88.451 kg)  SpO2 97% VS noted,  Constitutional: Pt is oriented to person, place, and time. Appears well-developed and well-nourished, in no significant distress Head: Normocephalic and atraumatic  Eyes: Conjunctivae and EOM are normal. Pupils are equal, round, and reactive to light Right Ear: External ear normal.  Left Ear: External ear normal Nose: Nose normal.  Mouth/Throat: Oropharynx is clear and moist  Neck: Normal range of motion. Neck supple. No JVD present. No tracheal deviation present or significant neck LA or mass, has mild low midline c-spine diffuse tender, without swelling or rash Cardiovascular: Normal rate, regular rhythm, normal heart sounds and intact distal pulses.   Pulmonary/Chest: Effort normal and breath  sounds without rales or wheezing  Abdominal: Soft. Bowel sounds are normal. NT. No HSM  Musculoskeletal: Normal range of motion. Exhibits no edema Lymphadenopathy: Has no cervical adenopathy.  Neurological: Pt is alert and oriented to person, place, and time. Pt has normal reflexes. No cranial nerve deficit. Motor grossly intact Left shoulder NT but with pain and reduced ROM to 90 degrees Skin: Skin is warm and dry. No rash noted or new ulcers Psychiatric:  Has normal mood and affect. Behavior is normal.     Assessment & Plan:

## 2015-07-16 NOTE — Assessment & Plan Note (Signed)
Likely rot cuff tear, prob chronic but does not bother her too much and can be worked around, declines sport med referral

## 2015-07-16 NOTE — Progress Notes (Signed)
Pre visit review using our clinic review tool, if applicable. No additional management support is needed unless otherwise documented below in the visit note. 

## 2015-07-16 NOTE — Assessment & Plan Note (Signed)
With radiation to the occiput bilat, c/w liekly underlying c spine djd/ddd, ok for alleve prn,  to f/u any worsening symptoms or concerns

## 2015-07-17 LAB — HEPATITIS C ANTIBODY: HCV Ab: NEGATIVE

## 2015-07-21 NOTE — Assessment & Plan Note (Signed)
stable overall by history and exam, recent data reviewed with pt, and pt to continue medical treatment as before,  to f/u any worsening symptoms or concerns Lab Results  Component Value Date   TSH 0.61 07/16/2015    

## 2015-07-21 NOTE — Assessment & Plan Note (Signed)

## 2015-07-21 NOTE — Assessment & Plan Note (Signed)
stable overall by history and exam, recent data reviewed with pt, and pt to continue medical treatment as before,  to f/u any worsening symptoms or concerns BP Readings from Last 3 Encounters:  07/16/15 124/82  02/05/15 118/72  01/16/15 118/74

## 2015-07-21 NOTE — Assessment & Plan Note (Signed)
stable overall by history and exam, recent data reviewed with pt, and pt to continue medical treatment as before,  to f/u any worsening symptoms or concerns Lab Results  Component Value Date   LDLCALC 109* 07/16/2015

## 2015-08-13 ENCOUNTER — Other Ambulatory Visit: Payer: Self-pay | Admitting: Internal Medicine

## 2015-09-17 ENCOUNTER — Other Ambulatory Visit: Payer: Self-pay | Admitting: Obstetrics and Gynecology

## 2015-09-17 DIAGNOSIS — Z1231 Encounter for screening mammogram for malignant neoplasm of breast: Secondary | ICD-10-CM

## 2015-09-24 ENCOUNTER — Ambulatory Visit
Admission: RE | Admit: 2015-09-24 | Discharge: 2015-09-24 | Disposition: A | Discharge status: Home / Self Care | Payer: Commercial Managed Care - HMO | Source: Ambulatory Visit | Attending: Obstetrics and Gynecology | Admitting: Obstetrics and Gynecology

## 2015-09-24 DIAGNOSIS — Z1231 Encounter for screening mammogram for malignant neoplasm of breast: Secondary | ICD-10-CM

## 2015-10-01 DIAGNOSIS — H5203 Hypermetropia, bilateral: Secondary | ICD-10-CM | POA: Diagnosis not present

## 2015-10-01 DIAGNOSIS — H5210 Myopia, unspecified eye: Secondary | ICD-10-CM | POA: Diagnosis not present

## 2015-12-11 ENCOUNTER — Other Ambulatory Visit: Payer: Self-pay | Admitting: Internal Medicine

## 2016-01-16 ENCOUNTER — Encounter: Payer: Self-pay | Admitting: Internal Medicine

## 2016-01-16 ENCOUNTER — Ambulatory Visit (INDEPENDENT_AMBULATORY_CARE_PROVIDER_SITE_OTHER): Payer: Commercial Managed Care - HMO | Admitting: Internal Medicine

## 2016-01-16 VITALS — BP 138/76 | HR 100 | Resp 20 | Wt 203.0 lb

## 2016-01-16 DIAGNOSIS — F32A Depression, unspecified: Secondary | ICD-10-CM

## 2016-01-16 DIAGNOSIS — E039 Hypothyroidism, unspecified: Secondary | ICD-10-CM | POA: Diagnosis not present

## 2016-01-16 DIAGNOSIS — E785 Hyperlipidemia, unspecified: Secondary | ICD-10-CM | POA: Diagnosis not present

## 2016-01-16 DIAGNOSIS — I10 Essential (primary) hypertension: Secondary | ICD-10-CM

## 2016-01-16 DIAGNOSIS — F329 Major depressive disorder, single episode, unspecified: Secondary | ICD-10-CM | POA: Diagnosis not present

## 2016-01-16 DIAGNOSIS — Z0000 Encounter for general adult medical examination without abnormal findings: Secondary | ICD-10-CM

## 2016-01-16 MED ORDER — VALSARTAN 80 MG PO TABS: 80.0000 mg | ORAL_TABLET | Freq: Every day | ORAL | 3 refills | Status: DC

## 2016-01-16 NOTE — Progress Notes (Signed)
Pre visit review using our clinic review tool, if applicable. No additional management support is needed unless otherwise documented below in the visit note. 

## 2016-01-16 NOTE — Progress Notes (Signed)
Subjective:    Patient ID: [name], female    DOB: 08/20/1945, 70 y.o.   MRN: [id number]  HPI  Here to f/u; overall doing ok,  Pt denies chest pain, increasing sob or doe, wheezing, orthopnea, PND, increased LE swelling, palpitations, dizziness or syncope.  Pt denies new neurological symptoms such as new headache, or facial or extremity weakness or numbness.  Pt denies polydipsia, polyuria, or low sugar episode.   Pt denies new neurological symptoms such as new headache, or facial or extremity weakness or numbness.   Pt states overall good compliance with meds, mostly trying to follow appropriate diet, with wt overall stable,  but little exercise however.  Denies hyper or hypo thyroid symptoms such as voice, skin or hair change. Denies worsening depressive symptoms, suicidal ideation, or panic; has ongoing anxiety, not increased recently.  No other new history per pt Past Medical History:  Diagnosis Date  . Allergic rhinitis, cause unspecified 12/26/2010  . Cataract    1 eye-? which  . DEGENERATIVE JOINT DISEASE 11/25/2006   in knees   . DEPRESSION 11/25/2006  . DIVERTICULOSIS, COLON 12/04/2006  . DIZZINESS 11/18/2009  . DJD (degenerative joint disease)    Bilateral Hip  . Headache    uses aleve for headache   . HYPERCHOLESTEROLEMIA 11/25/2006  . HYPERLIPIDEMIA 11/18/2009  . HYPERPARATHYROIDISM, HX OF 11/25/2006  . HYPERTENSION 07/15/2007  . HYPOTHYROIDISM 11/25/2006  . OBESITY 12/04/2006  . OSTEOPENIA 11/25/2006  . OTITIS MEDIA, ACUTE, LEFT 11/18/2009  . PALPITATIONS, RECURRENT 07/20/2008  . POSTMENOPAUSAL STATUS 11/25/2006  . PULMONARY NODULE 11/25/2006  . Vitamin D deficiency    Past Surgical History:  Procedure Laterality Date  . ABDOMINAL HYSTERECTOMY    . COLONOSCOPY    . left knee    . PARATHYROIDECTOMY Right 11/20/2014   Procedure: RIGHT PARATHYROIDECTOMY;  Surgeon: Darnell Levelodd Gerkin, MD;  [facility];  Service: General;  Laterality: Right;  . POLYPECTOMY    . right wrist    .  THYROID SURGERY     early 70's  . TOTAL HIP ARTHROPLASTY     left 3/05, right 2002  . UMBILICAL HERNIA REPAIR      reports that she quit smoking about 47 years ago. She quit after 1.00 year of use. She quit smokeless tobacco use about 41 years ago. She reports that she does not drink alcohol or use drugs. family history includes Breast cancer in her mother; Hypertension in her other; Stroke in her father. No Known Allergies Current Outpatient Prescriptions on File Prior to Visit  Medication Sig Dispense Refill  . alendronate (FOSAMAX) 70 MG tablet TAKE 1 TABLET ONE TIME EVERY WEEK  (ON SUNDAY MORNINGS) WITH  A  FULL  GLASS  OF  WATER ON AN EMPTY STOMACH 12 tablet 1  . aspirin 81 MG EC tablet Take 81 mg by mouth daily.     . cholecalciferol (VITAMIN D) 1000 UNITS tablet Take 1,000 Units by mouth 2 (two) times daily.     Marland Kitchen. levothyroxine (SYNTHROID, LEVOTHROID) 75 MCG tablet TAKE 1 TABLET EVERY DAY 90 tablet 3  . lovastatin (MEVACOR) 20 MG tablet TAKE 1 TABLET EVERY DAY 90 tablet 3  . Naproxen Sodium (ALEVE) 220 MG CAPS Take 440 mg by mouth daily as needed (pain).     Marland Kitchen. OVER THE COUNTER MEDICATION Place 1 drop into both eyes daily as needed (dry eyes). Over the counter lubricating eye drops    . psyllium (HYDROCIL/METAMUCIL) 95 % PACK Take 1  packet by mouth daily as needed (constipation).      No current facility-administered medications on file prior to visit.    Review of Systems  Constitutional: Negative for unusual diaphoresis or night sweats HENT: Negative for ear swelling or discharge Eyes: Negative for worsening visual haziness  Respiratory: Negative for choking and stridor.   Gastrointestinal: Negative for distension or worsening eructation Genitourinary: Negative for retention or change in urine volume.  Musculoskeletal: Negative for other MSK pain or swelling Skin: Negative for color change and worsening wound Neurological: Negative for tremors and numbness other than noted    Psychiatric/Behavioral: Negative for decreased concentration or agitation other than above   All other system neg per pt    Objective:   Physical Exam BP 138/76   Pulse 100   Resp 20   Wt 203 lb (92.1 kg)   SpO2 95%   BMI 41.00 kg/m   VS noted,  Constitutional: Pt appears in no apparent distress HENT: Head: NCAT.  Right Ear: External ear normal.  Left Ear: External ear normal.  Eyes: . Pupils are equal, round, and reactive to light. Conjunctivae and EOM are normal Neck: Normal range of motion. Neck supple.  Cardiovascular: Normal rate and regular rhythm.   Pulmonary/Chest: Effort normal and breath sounds without rales or wheezing.  Neurological: Pt is alert. Not confused , motor grossly intact Skin: Skin is warm. No rash, no LE edema Psychiatric: Pt behavior is normal. No agitation. not depressed affect No other new exam findings      Assessment & Plan:

## 2016-01-16 NOTE — Patient Instructions (Signed)
Please continue all other medications as before, and refills have been done if requested.  Please have the pharmacy call with any other refills you may need.  Please continue your efforts at being more active, low cholesterol diet, and weight control.  You are otherwise up to date with prevention measures today.  Please keep your appointments with your specialists as you may have planned  Please return in 6 months, or sooner if needed, with Lab testing done 3-5 days before  

## 2016-01-19 NOTE — Assessment & Plan Note (Signed)
stable overall by history and exam, recent data reviewed with pt, and pt to continue medical treatment as before,  to f/u any worsening symptoms or concerns Lab Results  Component Value Date   LDLCALC 109 (H) 07/16/2015

## 2016-01-19 NOTE — Assessment & Plan Note (Signed)
stable overall by history and exam, recent data reviewed with pt, and pt to continue medical treatment as before,  to f/u any worsening symptoms or concerns Lab Results  Component Value Date   WBC 3.7 (L) 07/16/2015   HGB 12.7 07/16/2015   HCT 38.5 07/16/2015   PLT 249.0 07/16/2015   GLUCOSE 76 07/16/2015   CHOL 178 07/16/2015   TRIG 59.0 07/16/2015   HDL 57.50 07/16/2015   LDLDIRECT 118.5 01/01/2012   LDLCALC 109 (H) 07/16/2015   ALT 10 07/16/2015   AST 15 07/16/2015   NA 140 07/16/2015   K 4.2 07/16/2015   CL 106 07/16/2015   CREATININE 0.67 07/16/2015   BUN 12 07/16/2015   CO2 30 07/16/2015   TSH 0.61 07/16/2015

## 2016-01-19 NOTE — Assessment & Plan Note (Signed)
stable overall by history and exam, recent data reviewed with pt, and pt to continue medical treatment as before,  to f/u any worsening symptoms or concerns BP Readings from Last 3 Encounters:  01/16/16 138/76  07/16/15 124/82  02/05/15 118/72

## 2016-01-19 NOTE — Assessment & Plan Note (Signed)
stable overall by history and exam, recent data reviewed with pt, and pt to continue medical treatment as before,  to f/u any worsening symptoms or concerns Lab Results  Component Value Date   TSH 0.61 07/16/2015

## 2016-03-31 ENCOUNTER — Telehealth: Payer: Self-pay | Admitting: Internal Medicine

## 2016-03-31 MED ORDER — LEVOTHYROXINE SODIUM 75 MCG PO TABS: 75.0000 ug | ORAL_TABLET | Freq: Every day | ORAL | 3 refills | Status: DC

## 2016-03-31 NOTE — Telephone Encounter (Signed)
Pt came by office requesting a refill on Levothyroxine Soduim (Tab) 75 MCG.  [facility] send to [facility] pharmacy per pt.

## 2016-03-31 NOTE — Telephone Encounter (Signed)
Medication refill sent to pharmacy per patient

## 2016-04-01 ENCOUNTER — Other Ambulatory Visit: Payer: Self-pay | Admitting: Internal Medicine

## 2016-05-02 ENCOUNTER — Other Ambulatory Visit: Payer: Self-pay | Admitting: Internal Medicine

## 2016-06-06 ENCOUNTER — Other Ambulatory Visit: Payer: Self-pay | Admitting: Internal Medicine

## 2016-06-30 ENCOUNTER — Telehealth: Payer: Self-pay | Admitting: Internal Medicine

## 2016-06-30 NOTE — Telephone Encounter (Signed)
Called patient to schedule awv. Lvm for patient to call office to schedule appt.  °

## 2016-07-14 ENCOUNTER — Encounter: Payer: Self-pay | Admitting: Internal Medicine

## 2016-07-14 ENCOUNTER — Other Ambulatory Visit (INDEPENDENT_AMBULATORY_CARE_PROVIDER_SITE_OTHER): Payer: Medicare HMO

## 2016-07-14 ENCOUNTER — Ambulatory Visit (INDEPENDENT_AMBULATORY_CARE_PROVIDER_SITE_OTHER): Payer: Medicare HMO | Admitting: Internal Medicine

## 2016-07-14 VITALS — BP 118/76 | HR 98 | Ht 59.0 in | Wt 203.0 lb

## 2016-07-14 DIAGNOSIS — Z0000 Encounter for general adult medical examination without abnormal findings: Secondary | ICD-10-CM

## 2016-07-14 DIAGNOSIS — E2839 Other primary ovarian failure: Secondary | ICD-10-CM

## 2016-07-14 LAB — HEPATIC FUNCTION PANEL
ALBUMIN: 4.3 g/dL (ref 3.5–5.2)
ALT: 9 U/L (ref 0–35)
AST: 16 U/L (ref 0–37)
Alkaline Phosphatase: 36 U/L — ABNORMAL LOW (ref 39–117)
Bilirubin, Direct: 0.2 mg/dL (ref 0.0–0.3)
TOTAL PROTEIN: 7.9 g/dL (ref 6.0–8.3)
Total Bilirubin: 0.4 mg/dL (ref 0.2–1.2)

## 2016-07-14 LAB — CBC WITH DIFFERENTIAL/PLATELET
BASOS ABS: 0 10*3/uL (ref 0.0–0.1)
Basophils Relative: 0.2 % (ref 0.0–3.0)
Eosinophils Absolute: 0.1 10*3/uL (ref 0.0–0.7)
Eosinophils Relative: 2 % (ref 0.0–5.0)
HEMATOCRIT: 40.4 % (ref 36.0–46.0)
Hemoglobin: 13.2 g/dL (ref 12.0–15.0)
LYMPHS PCT: 17 % (ref 12.0–46.0)
Lymphs Abs: 0.7 10*3/uL (ref 0.7–4.0)
MCHC: 32.7 g/dL (ref 30.0–36.0)
MCV: 89 fl (ref 78.0–100.0)
MONOS PCT: 12.1 % — AB (ref 3.0–12.0)
Monocytes Absolute: 0.5 10*3/uL (ref 0.1–1.0)
NEUTROS ABS: 2.7 10*3/uL (ref 1.4–7.7)
Neutrophils Relative %: 68.7 % (ref 43.0–77.0)
PLATELETS: 256 10*3/uL (ref 150.0–400.0)
RBC: 4.54 Mil/uL (ref 3.87–5.11)
RDW: 13.6 % (ref 11.5–15.5)
WBC: 3.9 10*3/uL — AB (ref 4.0–10.5)

## 2016-07-14 LAB — LIPID PANEL
Cholesterol: 182 mg/dL (ref 0–200)
HDL: 64.6 mg/dL (ref >39.00)
LDL Cholesterol: 107 mg/dL — ABNORMAL HIGH (ref 0–99)
NonHDL: 117.3
TRIGLYCERIDES: 54 mg/dL (ref 0.0–149.0)
Total CHOL/HDL Ratio: 3
VLDL: 10.8 mg/dL (ref 0.0–40.0)

## 2016-07-14 LAB — BASIC METABOLIC PANEL
BUN: 13 mg/dL (ref 6–23)
CALCIUM: 9.7 mg/dL (ref 8.4–10.5)
CHLORIDE: 106 meq/L (ref 96–112)
CO2: 27 meq/L (ref 19–32)
CREATININE: 0.86 mg/dL (ref 0.40–1.20)
GFR: 83.21 mL/min (ref >60.00)
GLUCOSE: 106 mg/dL — AB (ref 70–99)
Potassium: 4.5 mEq/L (ref 3.5–5.1)
Sodium: 140 mEq/L (ref 135–145)

## 2016-07-14 LAB — URINALYSIS, ROUTINE W REFLEX MICROSCOPIC
BILIRUBIN URINE: NEGATIVE
HGB URINE DIPSTICK: NEGATIVE
KETONES UR: NEGATIVE
LEUKOCYTES UA: NEGATIVE
NITRITE: NEGATIVE
Specific Gravity, Urine: 1.01 (ref 1.000–1.030)
TOTAL PROTEIN, URINE-UPE24: NEGATIVE
URINE GLUCOSE: NEGATIVE
UROBILINOGEN UA: 0.2 (ref 0.0–1.0)
pH: 6 (ref 5.0–8.0)

## 2016-07-14 LAB — TSH: TSH: 0.59 u[IU]/mL (ref 0.35–4.50)

## 2016-07-14 NOTE — Patient Instructions (Addendum)
Please continue all other medications as before, and refills have been done if requested.  Please have the pharmacy call with any other refills you may need.  Please continue your efforts at being more active, low cholesterol diet, and weight control.  You are otherwise up to date with prevention measures today.  Please keep your appointments with your specialists as you may have planned  Please schedule the bone density test before leaving today at the scheduling desk (where you check out)  Please go to the LAB in the Basement (turn left off the elevator) for the tests to be done today  You will be contacted by phone if any changes need to be made immediately.  Otherwise, you will receive a letter about your results with an explanation, but please check with MyChart first.  Please remember to sign up for MyChart if you have not done so, as this will be important to you in the future with finding out test results, communicating by private email, and scheduling acute appointments online when needed.  Please return in 1 year for your yearly visit, or sooner if needed, with Lab testing done 3-5 days before  

## 2016-07-14 NOTE — Progress Notes (Signed)
Subjective:    Patient ID: [name], female    DOB: 01/25/1946, 71 y.o.   MRN: [id number]  HPI  Here for wellness and f/u;  Overall doing ok;  Pt denies Chest pain, worsening SOB, DOE, wheezing, orthopnea, PND, worsening LE edema, palpitations, dizziness or syncope.  Pt denies neurological change such as new headache, facial or extremity weakness.  Pt denies polydipsia, polyuria, or low sugar symptoms. Pt states overall good compliance with treatment and medications, good tolerability, and has been trying to follow appropriate diet.  Pt denies worsening depressive symptoms, suicidal ideation or panic. No fever, night sweats, wt loss, loss of appetite, or other constitutional symptoms.  Pt states good ability with ADL's, has low fall risk, home safety reviewed and adequate, no other significant changes in hearing or vision, and not active with exercise.  Walks with cane, no falls, has stable chronic LBP and bilat knee pain, declines need for ortho Past Medical History:  Diagnosis Date  . Allergic rhinitis, cause unspecified 12/26/2010  . Cataract    1 eye-? which  . DEGENERATIVE JOINT DISEASE 11/25/2006   in knees   . DEPRESSION 11/25/2006  . DIVERTICULOSIS, COLON 12/04/2006  . DIZZINESS 11/18/2009  . DJD (degenerative joint disease)    Bilateral Hip  . Headache    uses aleve for headache   . HYPERCHOLESTEROLEMIA 11/25/2006  . HYPERLIPIDEMIA 11/18/2009  . HYPERPARATHYROIDISM, HX OF 11/25/2006  . HYPERTENSION 07/15/2007  . HYPOTHYROIDISM 11/25/2006  . OBESITY 12/04/2006  . OSTEOPENIA 11/25/2006  . OTITIS MEDIA, ACUTE, LEFT 11/18/2009  . PALPITATIONS, RECURRENT 07/20/2008  . POSTMENOPAUSAL STATUS 11/25/2006  . PULMONARY NODULE 11/25/2006  . Vitamin D deficiency    Past Surgical History:  Procedure Laterality Date  . ABDOMINAL HYSTERECTOMY    . COLONOSCOPY    . left knee    . PARATHYROIDECTOMY Right 11/20/2014   Procedure: RIGHT PARATHYROIDECTOMY;  Surgeon: Darnell Levelodd Gerkin, MD;  [facility];  Service: General;  Laterality: Right;  . POLYPECTOMY    . right wrist    . THYROID SURGERY     early 70's  . TOTAL HIP ARTHROPLASTY     left 3/05, right 2002  . UMBILICAL HERNIA REPAIR      reports that she quit smoking about 48 years ago. She quit after 1.00 year of use. She quit smokeless tobacco use about 41 years ago. She reports that she does not drink alcohol or use drugs. family history includes Breast cancer in her mother; Hypertension in her other; Stroke in her father. No Known Allergies Current Outpatient Prescriptions on File Prior to Visit  Medication Sig Dispense Refill  . alendronate (FOSAMAX) 70 MG tablet TAKE 1 TABLET ONE TIME EVERY WEEK  (ON SUNDAY MORNINGS) WITH  A  FULL  GLASS  OF  WATER ON AN EMPTY STOMACH 12 tablet 1  . aspirin 81 MG EC tablet Take 81 mg by mouth daily.     . cholecalciferol (VITAMIN D) 1000 UNITS tablet Take 1,000 Units by mouth 2 (two) times daily.     Marland Kitchen. levothyroxine (SYNTHROID, LEVOTHROID) 75 MCG tablet Take 1 tablet (75 mcg total) by mouth daily. 90 tablet 3  . lovastatin (MEVACOR) 20 MG tablet Take 1 tablet (20 mg total) by mouth daily. Yearly physical w/labs due in May must see MD for future refills 90 tablet 0  . Naproxen Sodium (ALEVE) 220 MG CAPS Take 440 mg by mouth daily as needed (pain).     Marland Kitchen. OVER THE  COUNTER MEDICATION Place 1 drop into both eyes daily as needed (dry eyes). Over the counter lubricating eye drops    . psyllium (HYDROCIL/METAMUCIL) 95 % PACK Take 1 packet by mouth daily as needed (constipation).     . valsartan (DIOVAN) 80 MG tablet Take 1 tablet (80 mg total) by mouth daily. 90 tablet 3   No current facility-administered medications on file prior to visit.    Review of Systems Constitutional: Negative for other unusual diaphoresis, sweats, appetite or weight changes HENT: Negative for other worsening hearing loss, ear pain, facial swelling, mouth sores or neck stiffness.   Eyes: Negative for other worsening pain,  redness or other visual disturbance.  Respiratory: Negative for other stridor or swelling Cardiovascular: Negative for other palpitations or other chest pain  Gastrointestinal: Negative for worsening diarrhea or loose stools, blood in stool, distention or other pain Genitourinary: Negative for hematuria, flank pain or other change in urine volume.  Musculoskeletal: Negative for myalgias or other joint swelling.  Skin: Negative for other color change, or other wound or worsening drainage.  Neurological: Negative for other syncope or numbness. Hematological: Negative for other adenopathy or swelling Psychiatric/Behavioral: Negative for hallucinations, other worsening agitation, SI, self-injury, or new decreased concentration All other system neg per pt    Objective:   Physical Exam BP 118/76   Pulse 98   Ht 4\' 11"  (1.499 m)   Wt 203 lb (92.1 kg)   SpO2 100%   BMI 41.00 kg/m  VS noted, morbid obese for height Constitutional: Pt is oriented to person, place, and time. Appears well-developed and well-nourished, in no significant distress and comfortable Head: Normocephalic and atraumatic  Eyes: Conjunctivae and EOM are normal. Pupils are equal, round, and reactive to light Right Ear: External ear normal without discharge Left Ear: External ear normal without discharge Nose: Nose without discharge or deformity Mouth/Throat: Oropharynx is without other ulcerations and moist  Neck: Normal range of motion. Neck supple. No JVD present. No tracheal deviation present or significant neck LA or mass Cardiovascular: Normal rate, regular rhythm, normal heart sounds and intact distal pulses.   Pulmonary/Chest: WOB normal and breath sounds without rales or wheezing  Abdominal: Soft. Bowel sounds are normal. NT. No HSM  Musculoskeletal: Normal range of motion. Exhibits no edema Lymphadenopathy: Has no other cervical adenopathy.  Neurological: Pt is alert and oriented to person, place, and time. Pt  has normal reflexes. No cranial nerve deficit. Motor grossly intact, Gait intact Spine nontender Bilat knees with bony deg changes no effusions Skin: Skin is warm and dry. No rash noted or new ulcerations Psychiatric:  Has normal mood and affect. Behavior is normal without agitation No other exam findings    Assessment & Plan:

## 2016-07-14 NOTE — Assessment & Plan Note (Signed)

## 2016-07-15 ENCOUNTER — Encounter: Payer: Self-pay | Admitting: Internal Medicine

## 2016-07-21 ENCOUNTER — Ambulatory Visit (INDEPENDENT_AMBULATORY_CARE_PROVIDER_SITE_OTHER)
Admission: RE | Admit: 2016-07-21 | Discharge: 2016-07-21 | Disposition: A | Discharge status: Home / Self Care | Payer: Medicare HMO | Source: Ambulatory Visit | Attending: Internal Medicine | Admitting: Internal Medicine

## 2016-07-21 DIAGNOSIS — E2839 Other primary ovarian failure: Secondary | ICD-10-CM | POA: Diagnosis not present

## 2016-07-24 ENCOUNTER — Encounter: Payer: Self-pay | Admitting: Internal Medicine

## 2016-07-28 ENCOUNTER — Ambulatory Visit: Payer: Commercial Managed Care - HMO

## 2016-08-12 ENCOUNTER — Ambulatory Visit: Payer: Medicare HMO

## 2016-08-12 ENCOUNTER — Other Ambulatory Visit: Payer: Self-pay | Admitting: Internal Medicine

## 2016-08-14 ENCOUNTER — Ambulatory Visit (INDEPENDENT_AMBULATORY_CARE_PROVIDER_SITE_OTHER): Payer: Medicare HMO | Admitting: *Deleted

## 2016-08-14 ENCOUNTER — Telehealth: Payer: Self-pay | Admitting: *Deleted

## 2016-08-14 ENCOUNTER — Encounter: Payer: Self-pay | Admitting: Internal Medicine

## 2016-08-14 VITALS — BP 124/68 | HR 94 | Resp 20 | Ht 59.0 in | Wt 223.0 lb

## 2016-08-14 DIAGNOSIS — R269 Unspecified abnormalities of gait and mobility: Secondary | ICD-10-CM | POA: Insufficient documentation

## 2016-08-14 DIAGNOSIS — Z0000 Encounter for general adult medical examination without abnormal findings: Secondary | ICD-10-CM | POA: Diagnosis not present

## 2016-08-14 MED ORDER — LOVASTATIN 20 MG PO TABS: 20.0000 mg | ORAL_TABLET | Freq: Every day | ORAL | 3 refills | Status: DC

## 2016-08-14 NOTE — Progress Notes (Signed)
Pre visit review using our clinic review tool, if applicable. No additional management support is needed unless otherwise documented below in the visit note. 

## 2016-08-14 NOTE — Telephone Encounter (Signed)
Has been mailed

## 2016-08-14 NOTE — Telephone Encounter (Signed)
During AWV, patient stated that she needed an order for a rolling walker. She also needs a refill for lovastatin.

## 2016-08-14 NOTE — Progress Notes (Signed)
Subjective:   [name] is a 71 y.o. female who presents for Medicare Annual (Subsequent) preventive examination.  Review of Systems:  No ROS.  Medicare Wellness Visit. Additional risk factors are reflected in the social history.  Cardiac Risk Factors include: advanced age (>1155men, 86>65 women);hypertension;obesity (BMI >30kg/m2) Sleep patterns: feels rested on waking, gets up 1 times nightly to void and sleeps 6-7 hours nightly.    Home Safety/Smoke Alarms: Feels safe in home. Smoke alarms in place.  Living environment; residence and Firearm Safety: 1-story house/ trailer, no firearms. Lives alone, good family support Seat Belt Safety/Bike Helmet: Wears seat belt.   Counseling:   Eye Exam- appointment yearly Dental- Resources provided  Female:   Pap- N/A      Mammo- Last 09/24/15,  BI-RADS CATEGORY  1: Negative    Dexa scan- Last 07/21/16, osteoporosis      CCS- Last 03/31/13, 10 years     Objective:     Vitals: BP 124/68   Pulse 94   Resp 20   Ht 4\' 11"  (1.499 m)   Wt 223 lb (101.2 kg)   SpO2 98%   BMI 45.04 kg/m   Body mass index is 45.04 kg/m.   Tobacco History  Smoking Status  . Former Smoker  . Years: 1.00  . Quit date: 02/17/1968  Smokeless Tobacco  . Former NeurosurgeonUser  . Quit date: 11/12/1974    Comment: tried snuff as a child, casual smoker as young adult     Counseling given: Not Answered   Past Medical History:  Diagnosis Date  . Allergic rhinitis, cause unspecified 12/26/2010  . Cataract    1 eye-? which  . DEGENERATIVE JOINT DISEASE 11/25/2006   in knees   . DEPRESSION 11/25/2006  . DIVERTICULOSIS, COLON 12/04/2006  . DIZZINESS 11/18/2009  . DJD (degenerative joint disease)    Bilateral Hip  . Headache    uses aleve for headache   . HYPERCHOLESTEROLEMIA 11/25/2006  . HYPERLIPIDEMIA 11/18/2009  . HYPERPARATHYROIDISM, HX OF 11/25/2006  . HYPERTENSION 07/15/2007  . HYPOTHYROIDISM 11/25/2006  . OBESITY 12/04/2006  . OSTEOPENIA 11/25/2006  . OTITIS  MEDIA, ACUTE, LEFT 11/18/2009  . PALPITATIONS, RECURRENT 07/20/2008  . POSTMENOPAUSAL STATUS 11/25/2006  . PULMONARY NODULE 11/25/2006  . Vitamin D deficiency    Past Surgical History:  Procedure Laterality Date  . ABDOMINAL HYSTERECTOMY    . COLONOSCOPY    . left knee    . PARATHYROIDECTOMY Right 11/20/2014   Procedure: RIGHT PARATHYROIDECTOMY;  Surgeon: Darnell Levelodd Gerkin, MD;  [facility];  Service: General;  Laterality: Right;  . POLYPECTOMY    . right wrist    . THYROID SURGERY     early 70's  . TOTAL HIP ARTHROPLASTY     left 3/05, right 2002  . UMBILICAL HERNIA REPAIR     Family History  Problem Relation Age of Onset  . Breast cancer Mother   . Stroke Father   . Hypertension Other   . Colon cancer Neg Hx   . Esophageal cancer Neg Hx   . Rectal cancer Neg Hx   . Stomach cancer Neg Hx    History  Sexual Activity  . Sexual activity: Not on file    Outpatient Encounter Prescriptions as of 08/14/2016  Medication Sig  . alendronate (FOSAMAX) 70 MG tablet TAKE 1 TABLET ONE TIME EVERY WEEK  (ON SUNDAY MORNINGS) WITH  A  FULL  GLASS  OF  WATER ON AN EMPTY STOMACH  . aspirin 81  MG EC tablet Take 81 mg by mouth daily.   . cholecalciferol (VITAMIN D) 1000 UNITS tablet Take 1,000 Units by mouth 2 (two) times daily.   Marland Kitchen levothyroxine (SYNTHROID, LEVOTHROID) 75 MCG tablet Take 1 tablet (75 mcg total) by mouth daily.  Marland Kitchen lovastatin (MEVACOR) 20 MG tablet Take 1 tablet (20 mg total) by mouth daily. Yearly physical w/labs due in May must see MD for future refills  . Naproxen Sodium (ALEVE) 220 MG CAPS Take 440 mg by mouth daily as needed (pain).   Marland Kitchen OVER THE COUNTER MEDICATION Place 1 drop into both eyes daily as needed (dry eyes). Over the counter lubricating eye drops  . psyllium (HYDROCIL/METAMUCIL) 95 % PACK Take 1 packet by mouth daily as needed (constipation).   . valsartan (DIOVAN) 80 MG tablet Take 1 tablet (80 mg total) by mouth daily.   No facility-administered encounter  medications on file as of 08/14/2016.     Activities of Daily Living In your present state of health, do you have any difficulty performing the following activities: 08/14/2016  Hearing? N  Vision? N  Difficulty concentrating or making decisions? N  Walking or climbing stairs? N  Dressing or bathing? N  Doing errands, shopping? N  Preparing Food and eating ? N  Using the Toilet? N  In the past six months, have you accidently leaked urine? N  Do you have problems with loss of bowel control? N  Managing your Medications? N  Managing your Finances? N  Housekeeping or managing your Housekeeping? N  Some recent data might be hidden    Patient Care Team: Corwin Levins, MD as PCP - General    Assessment:    Physical assessment deferred to PCP.  Exercise Activities and Dietary recommendations Current Exercise Habits: Structured exercise class, Type of exercise: walking;strength training/weights;calisthenics, Time (Minutes): 45, Frequency (Times/Week): 3, Weekly Exercise (Minutes/Week): 135, Intensity: Mild, Exercise limited by: None identified  Diet (meal preparation, eat out, water intake, caffeinated beverages, dairy products, fruits and vegetables): in general, a "healthy" diet  , well balanced, low salt   Discussed heart healthy diet and weight loss tips, encouraged patient to increase daily water intake. Diet education was provided via handout.     Goals    . Exercise 150 minutes per week (moderate activity)          Will go back to the [facility]; Will start back next week. Will continue to go to the church 2 days a week and will [facility] on Tues[facility] and Fridays     . Start to go to the [facility] again to do water aerobics          I will go to the [facility]  starting July 10th and continue to go 2-3 times weekly      Fall Risk Fall Risk  08/14/2016 07/16/2015 01/16/2015 01/14/2015 07/06/2014  Falls in the past year? No No No Yes Yes  Number falls in past yr: - - - 1 1  Injury with  Fall? - - - No No  Risk for fall due to : Impaired mobility;Impaired balance/gait - - Other (Comment) -  Risk for fall due to (comments): - - - accident and tripped -  Follow up - - - Education provided;Falls prevention discussed -   Depression Screen PHQ 2/9 Scores 08/14/2016 07/14/2016 07/16/2015 01/16/2015  PHQ - 2 Score 2 0 0 0  PHQ- 9 Score 3 - - -     Cognitive Function  MMSE - Mini Mental State Exam 08/14/2016 01/14/2015  Orientation to time 5 5  Orientation to Place 5 5  Registration 3 3  Attention/ Calculation 4 2  Recall 2 2  Language- name 2 objects 2 2  Language- repeat 1 1  Language- follow 3 step command 3 3  Language- read & follow direction 1 1  Write a sentence 1 1  Copy design 1 1  Total score 28 26        Immunization History  Administered Date(s) Administered  . Td 07/21/1995, 11/18/2009   Screening Tests Health Maintenance  Topic Date Due  . PNA vac Low Risk Adult (1 of 2 - PCV13) 08/20/2008  . INFLUENZA VACCINE  09/16/2016  . MAMMOGRAM  09/23/2017  . TETANUS/TDAP  11/19/2019  . COLONOSCOPY  04/01/2023  . DEXA SCAN  Completed  . Hepatitis C Screening  Completed      Plan:    Continue doing brain stimulating activities (puzzles, reading, adult coloring books, staying active) to keep memory sharp.   Continue to eat heart healthy diet (full of fruits, vegetables, whole grains, lean protein, water--limit salt, fat, and sugar intake) and increase physical activity as tolerated.  I have personally reviewed and noted the following in the patient's chart:   . Medical and social history . Use of alcohol, tobacco or illicit drugs  . Current medications and supplements . Functional ability and status . Nutritional status . Physical activity . Advanced directives . List of other physicians . Vitals . Screenings to include cognitive, depression, and falls . Referrals and appointments  In addition, I have reviewed and discussed with patient certain  preventive protocols, quality metrics, and best practice recommendations. A written personalized care plan for preventive services as well as general preventive health recommendations were provided to patient.     Wanda Plump, RN  08/14/2016

## 2016-08-14 NOTE — Telephone Encounter (Signed)
Ok for [employer] rx - Done hardcopy to [facility]

## 2016-08-14 NOTE — Patient Instructions (Signed)
Continue doing brain stimulating activities (puzzles, reading, adult coloring books, staying active) to keep memory sharp.   Continue to eat heart healthy diet (full of fruits, vegetables, whole grains, lean protein, water--limit salt, fat, and sugar intake) and increase physical activity as tolerated.   [name] , Thank you for taking time to come for your Medicare Wellness Visit. I appreciate your ongoing commitment to your health goals. Please review the following plan we discussed and let me know if I can assist you in the future.   These are the goals we discussed: Goals    . Exercise 150 minutes per week (moderate activity)          [facility]; Will start back next week. Will continue to go to the church 2 days a week and will [facility] on Tuesdays and Fridays     . [facility] to do [facility]          [facility] and continue to go 2-3 times weekly       This is a list of the screening recommended for you and due dates:  Health Maintenance  Topic Date Due  . Pneumonia vaccines (1 of 2 - PCV13) 08/20/2008  . Flu Shot  09/16/2016  . Mammogram  09/23/2017  . Tetanus Vaccine  11/19/2019  . Colon Cancer Screening  04/01/2023  . DEXA scan (bone density measurement)  Completed  .  Hepatitis C: One time screening is recommended by Center for Disease Control  (CDC) for  adults born from [birth year] through 1967.   Completed   It is important to avoid accidents which may result in broken bones.  Here are a few ideas on how to make your home safer so you will be less likely to trip or fall.  1. Use nonskid mats or non slip strips in your shower or tub, on your bathroom floor and around sinks.  If you know that you have spilled water, wipe it up! 2. In the bathroom, it is important to have properly installed grab bars on the walls or on the edge of the tub.  Towel racks are NOT strong enough for you to hold onto or to pull  on for support. 3. Stairs and hallways should have enough light.  Add lamps or night lights if you need ore light. 4. It is good to have handrails on both sides of the stairs if possible.  Always fix broken handrails right away. 5. It is important to see the edges of steps.  Paint the edges of outdoor steps white so you can see them better.  Put colored tape on the edge of inside steps. 6. Throw-rugs are dangerous because they can slide.  Removing the rugs is the best idea, but if they must stay, add adhesive carpet tape to prevent slipping. 7. Do not keep things on stairs or in the halls.  Remove small furniture that blocks the halls as it may cause you to trip.  Keep telephone and electrical cords out of the way where you walk. 8. Always were sturdy, rubber-soled shoes for good support.  Never wear just socks, especially on the stairs.  Socks may cause you to slip or fall.  Do not wear full-length housecoats as you can easily trip on the bottom.  9. Place the things you use the most on the shelves that are the easiest  to reach.  If you use a stepstool, make sure it is in good condition.  If you feel unsteady, DO NOT climb, ask for help. 10. If a health professional advises you to use a cane or walker, do not be ashamed.  These items can keep you from falling and breaking your bones.

## 2016-09-08 ENCOUNTER — Other Ambulatory Visit: Payer: Self-pay | Admitting: Obstetrics and Gynecology

## 2016-09-08 DIAGNOSIS — Z1231 Encounter for screening mammogram for malignant neoplasm of breast: Secondary | ICD-10-CM

## 2016-09-11 IMAGING — US US PELVIS COMPLETE
1 series · 14 of 22 positions shown · non-contrast
Comparison: None

CLINICAL DATA: History of total abdominal hysterectomy, now with
vague low mid and left lower quadrant fullness on physical exam.



[Series 1: us pelvis complete · 0.38mm/px · 14 of 22 slices shown]
[im 1/22]
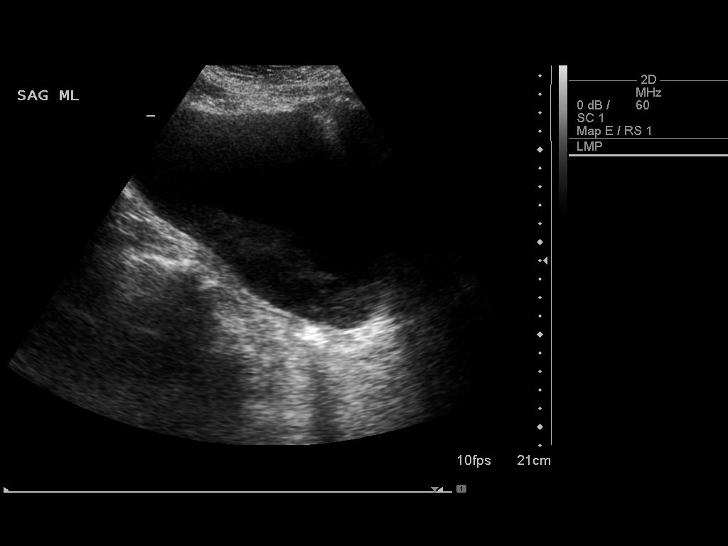
[im 3/22]
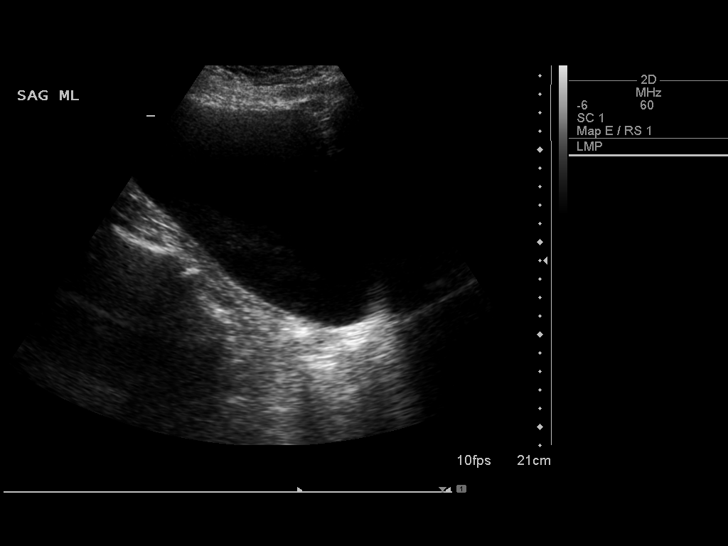
[im 4/22]
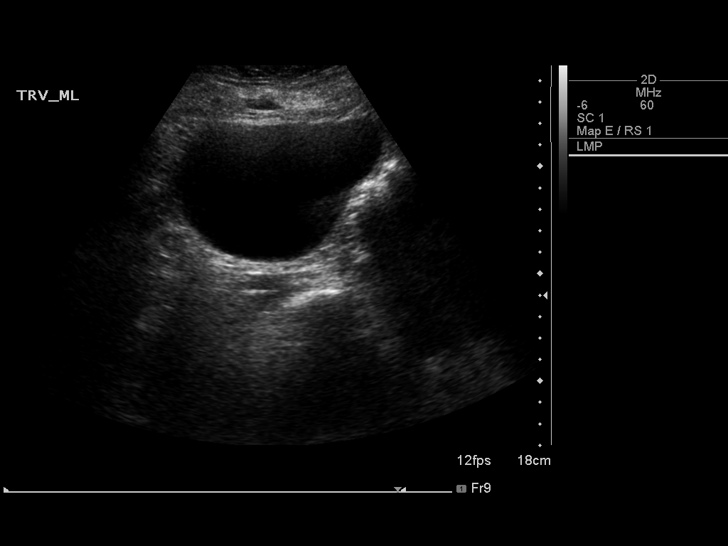
[im 6/22]
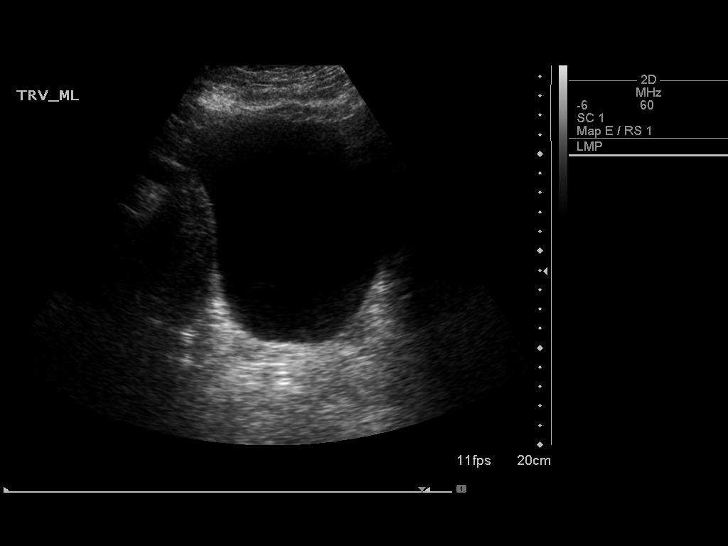
[im 8/22]
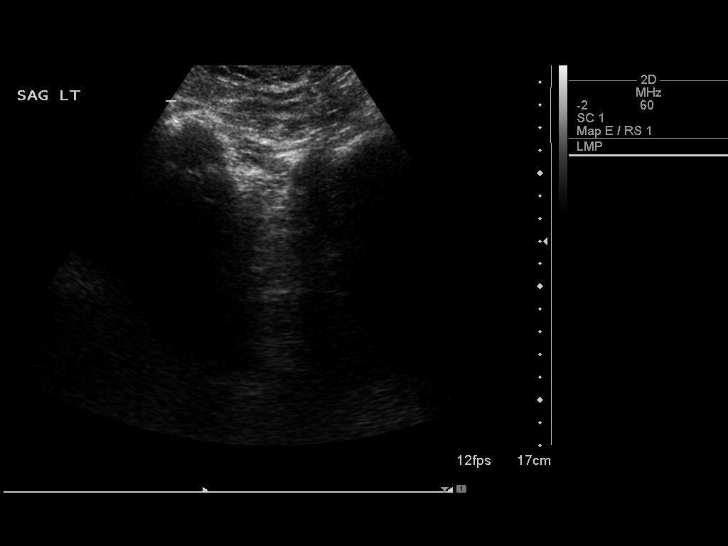
[im 9/22]
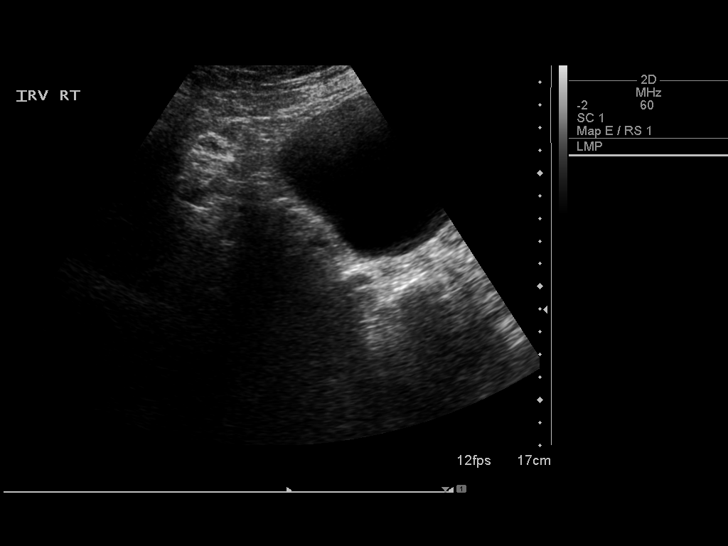
[im 11/22]
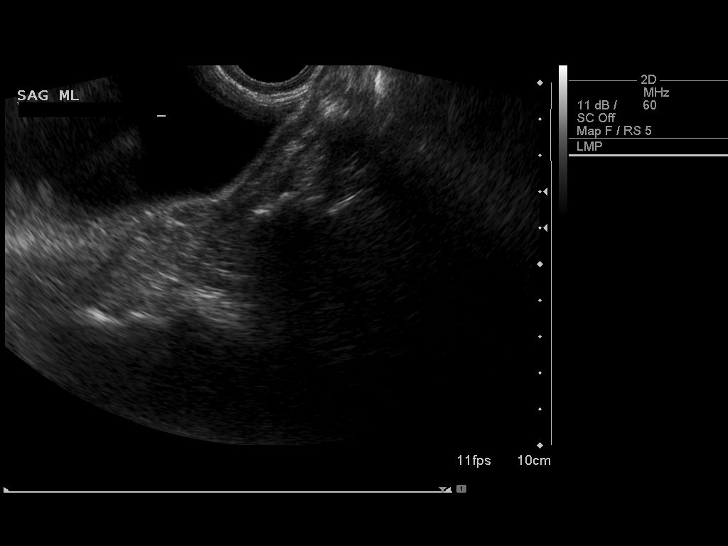
[im 12/22]
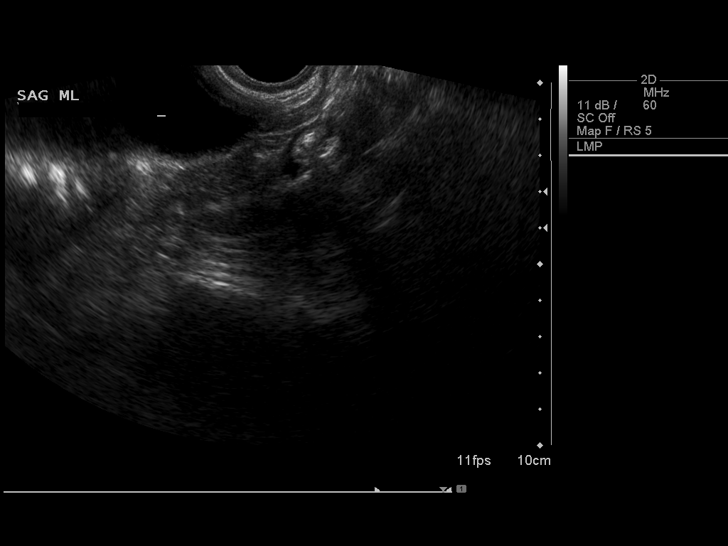
[im 14/22]
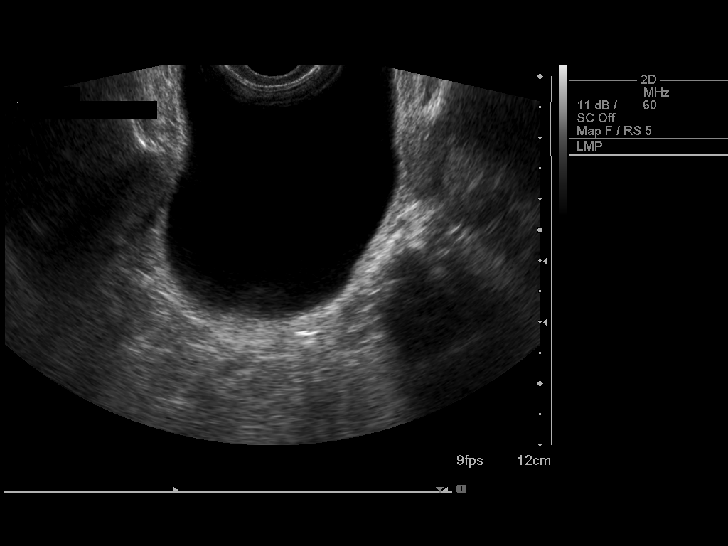
[im 15/22]
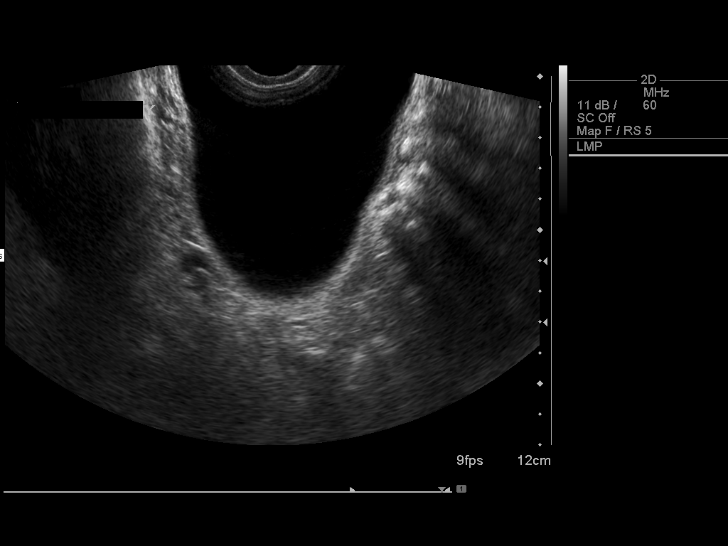
[im 17/22]
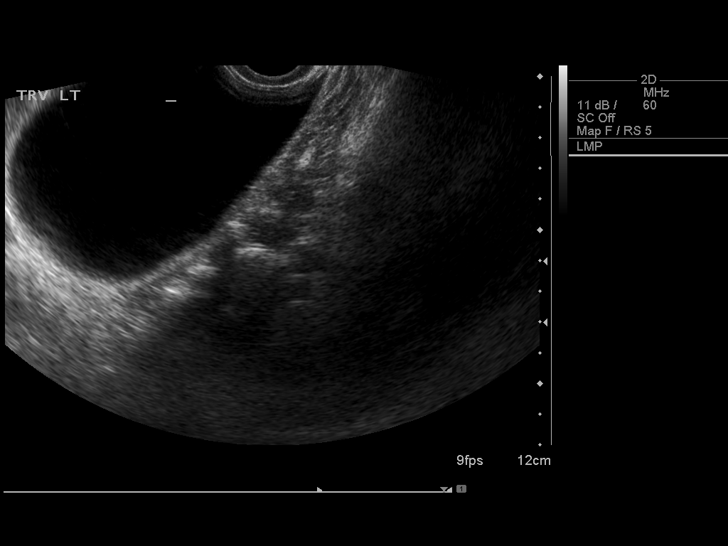
[im 19/22]
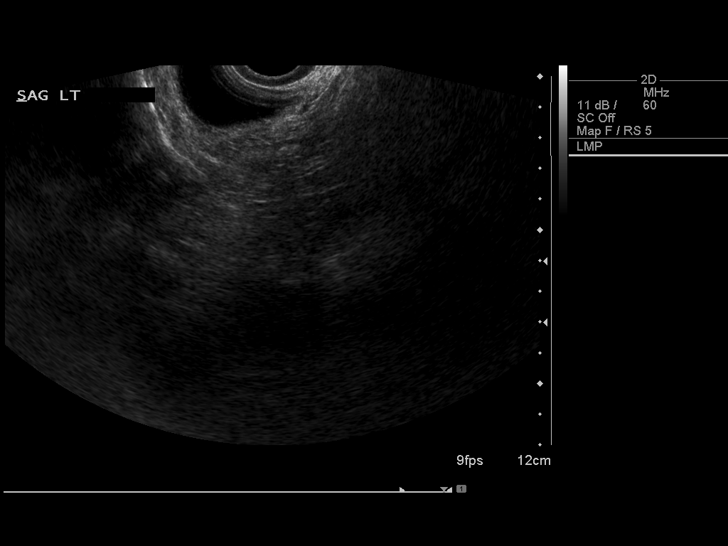
[im 20/22]
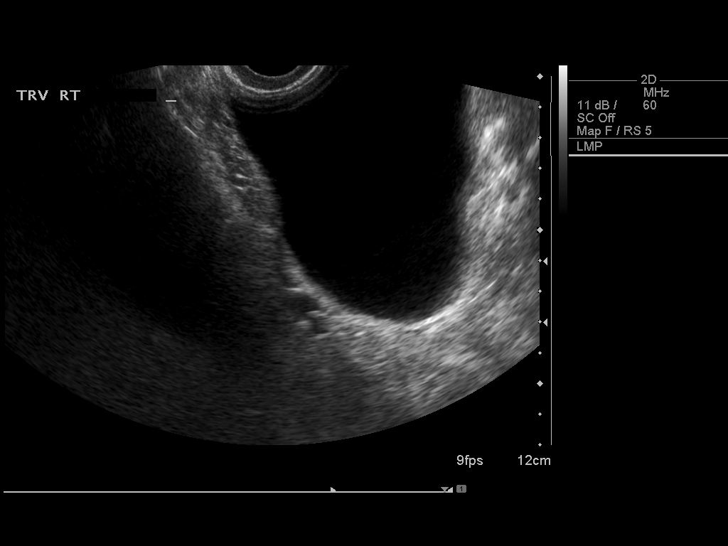
[im 22/22]
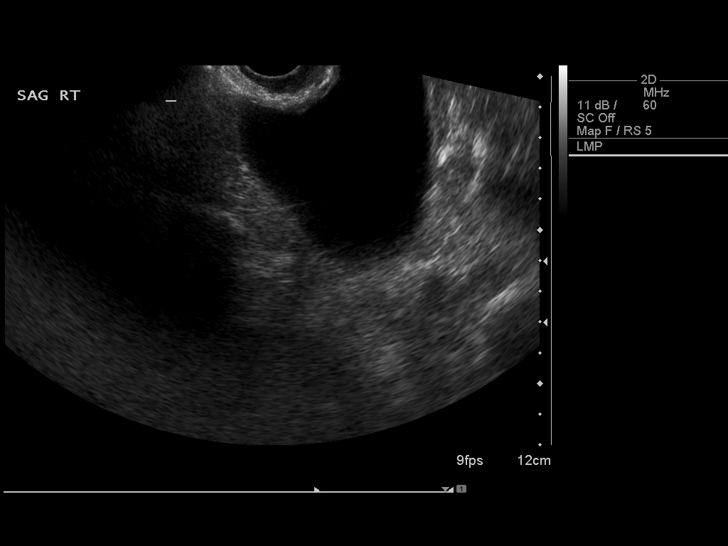

[14 of 22 positions shown; findings below may reference images not displayed]

FINDINGS: The uterus is surgically absent. The vaginal cuff has an
unremarkable appearance. The ovaries were not visualized. There is
no visualized adnexal mass.
IMPRESSION: Hysterectomy and nonvisualized ovaries, potentially resected. No
evidence of adnexal mass; given exam findings, consider CT
correlation.

## 2016-09-14 ENCOUNTER — Other Ambulatory Visit: Payer: Self-pay | Admitting: Internal Medicine

## 2016-09-25 ENCOUNTER — Ambulatory Visit
Admission: RE | Admit: 2016-09-25 | Discharge: 2016-09-25 | Disposition: A | Discharge status: Home / Self Care | Payer: Medicare HMO | Source: Ambulatory Visit | Attending: Obstetrics and Gynecology | Admitting: Obstetrics and Gynecology

## 2016-09-25 DIAGNOSIS — Z1231 Encounter for screening mammogram for malignant neoplasm of breast: Secondary | ICD-10-CM | POA: Diagnosis not present

## 2016-10-02 DIAGNOSIS — H524 Presbyopia: Secondary | ICD-10-CM | POA: Diagnosis not present

## 2016-10-27 ENCOUNTER — Other Ambulatory Visit: Payer: Self-pay | Admitting: Internal Medicine

## 2017-01-22 ENCOUNTER — Other Ambulatory Visit: Payer: Self-pay | Admitting: Internal Medicine

## 2017-01-26 NOTE — Telephone Encounter (Signed)
Refill done.  

## 2017-03-01 ENCOUNTER — Other Ambulatory Visit: Payer: Self-pay | Admitting: Internal Medicine

## 2017-05-25 DIAGNOSIS — M160 Bilateral primary osteoarthritis of hip: Secondary | ICD-10-CM | POA: Diagnosis not present

## 2017-05-25 DIAGNOSIS — M1712 Unilateral primary osteoarthritis, left knee: Secondary | ICD-10-CM | POA: Diagnosis not present

## 2017-05-25 DIAGNOSIS — M1710 Unilateral primary osteoarthritis, unspecified knee: Secondary | ICD-10-CM | POA: Insufficient documentation

## 2017-05-25 DIAGNOSIS — Z96643 Presence of artificial hip joint, bilateral: Secondary | ICD-10-CM | POA: Insufficient documentation

## 2017-05-25 DIAGNOSIS — Z471 Aftercare following joint replacement surgery: Secondary | ICD-10-CM | POA: Diagnosis not present

## 2017-05-25 DIAGNOSIS — M179 Osteoarthritis of knee, unspecified: Secondary | ICD-10-CM | POA: Insufficient documentation

## 2017-06-25 ENCOUNTER — Other Ambulatory Visit: Payer: Self-pay | Admitting: Internal Medicine

## 2017-07-05 ENCOUNTER — Other Ambulatory Visit: Payer: Self-pay | Admitting: Internal Medicine

## 2017-07-20 ENCOUNTER — Other Ambulatory Visit: Payer: Self-pay | Admitting: Internal Medicine

## 2017-07-20 ENCOUNTER — Other Ambulatory Visit (INDEPENDENT_AMBULATORY_CARE_PROVIDER_SITE_OTHER): Payer: Medicare HMO

## 2017-07-20 ENCOUNTER — Ambulatory Visit (INDEPENDENT_AMBULATORY_CARE_PROVIDER_SITE_OTHER): Payer: Medicare HMO | Admitting: Internal Medicine

## 2017-07-20 ENCOUNTER — Encounter: Payer: Self-pay | Admitting: Internal Medicine

## 2017-07-20 VITALS — BP 118/78 | HR 100 | Temp 98.8°F | Ht 59.0 in | Wt 200.0 lb

## 2017-07-20 DIAGNOSIS — I10 Essential (primary) hypertension: Secondary | ICD-10-CM

## 2017-07-20 DIAGNOSIS — Z0000 Encounter for general adult medical examination without abnormal findings: Secondary | ICD-10-CM

## 2017-07-20 DIAGNOSIS — Z6841 Body Mass Index (BMI) 40.0 and over, adult: Secondary | ICD-10-CM | POA: Diagnosis not present

## 2017-07-20 LAB — HEPATIC FUNCTION PANEL
ALBUMIN: 4.1 g/dL (ref 3.5–5.2)
ALT: 9 U/L (ref 0–35)
AST: 14 U/L (ref 0–37)
Alkaline Phosphatase: 40 U/L (ref 39–117)
Bilirubin, Direct: 0.1 mg/dL (ref 0.0–0.3)
TOTAL PROTEIN: 7.3 g/dL (ref 6.0–8.3)
Total Bilirubin: 0.4 mg/dL (ref 0.2–1.2)

## 2017-07-20 LAB — LIPID PANEL
CHOL/HDL RATIO: 3
CHOLESTEROL: 193 mg/dL (ref 0–200)
HDL: 57.8 mg/dL (ref >39.00)
LDL Cholesterol: 117 mg/dL — ABNORMAL HIGH (ref 0–99)
NonHDL: 135.17
TRIGLYCERIDES: 91 mg/dL (ref 0.0–149.0)
VLDL: 18.2 mg/dL (ref 0.0–40.0)

## 2017-07-20 LAB — URINALYSIS, ROUTINE W REFLEX MICROSCOPIC
Bilirubin Urine: NEGATIVE
Hgb urine dipstick: NEGATIVE
LEUKOCYTES UA: NEGATIVE
Nitrite: NEGATIVE
RBC / HPF: NONE SEEN (ref 0–?)
SPECIFIC GRAVITY, URINE: 1.02 (ref 1.000–1.030)
TOTAL PROTEIN, URINE-UPE24: NEGATIVE
URINE GLUCOSE: NEGATIVE
UROBILINOGEN UA: 0.2 (ref 0.0–1.0)
pH: 5.5 (ref 5.0–8.0)

## 2017-07-20 LAB — BASIC METABOLIC PANEL
BUN: 12 mg/dL (ref 6–23)
CALCIUM: 9.2 mg/dL (ref 8.4–10.5)
CO2: 29 meq/L (ref 19–32)
CREATININE: 0.85 mg/dL (ref 0.40–1.20)
Chloride: 105 mEq/L (ref 96–112)
GFR: 84.1 mL/min (ref >60.00)
Glucose, Bld: 96 mg/dL (ref 70–99)
Potassium: 4.3 mEq/L (ref 3.5–5.1)
SODIUM: 140 meq/L (ref 135–145)

## 2017-07-20 LAB — CBC WITH DIFFERENTIAL/PLATELET
Basophils Absolute: 0.1 10*3/uL (ref 0.0–0.1)
Basophils Relative: 1.9 % (ref 0.0–3.0)
EOS PCT: 3.6 % (ref 0.0–5.0)
Eosinophils Absolute: 0.2 10*3/uL (ref 0.0–0.7)
HEMATOCRIT: 38.9 % (ref 36.0–46.0)
Hemoglobin: 12.8 g/dL (ref 12.0–15.0)
LYMPHS ABS: 0.8 10*3/uL (ref 0.7–4.0)
LYMPHS PCT: 18.2 % (ref 12.0–46.0)
MCHC: 32.9 g/dL (ref 30.0–36.0)
MCV: 89 fl (ref 78.0–100.0)
MONOS PCT: 12.3 % — AB (ref 3.0–12.0)
Monocytes Absolute: 0.5 10*3/uL (ref 0.1–1.0)
NEUTROS ABS: 2.7 10*3/uL (ref 1.4–7.7)
NEUTROS PCT: 64 % (ref 43.0–77.0)
PLATELETS: 257 10*3/uL (ref 150.0–400.0)
RBC: 4.37 Mil/uL (ref 3.87–5.11)
RDW: 14.2 % (ref 11.5–15.5)
WBC: 4.2 10*3/uL (ref 4.0–10.5)

## 2017-07-20 LAB — TSH: TSH: 1.12 u[IU]/mL (ref 0.35–4.50)

## 2017-07-20 NOTE — Progress Notes (Signed)
Subjective:    Patient ID: [name], female    DOB: [birth year], 72 y.o.   MRN: [id number]  HPI  Here for wellness and f/u;  Overall doing ok;  Pt denies Chest pain, worsening SOB, DOE, wheezing, orthopnea, PND, worsening LE edema, palpitations, dizziness or syncope.  Pt denies neurological change such as new headache, facial or extremity weakness.  Pt denies polydipsia, polyuria, or low sugar symptoms. Pt states overall good compliance with treatment and medications, good tolerability, and has been trying to follow appropriate diet.  Pt denies worsening depressive symptoms, suicidal ideation or panic. No fever, night sweats, wt loss, loss of appetite, or other constitutional symptoms.  Pt states good ability with ADL's, has low fall risk, home safety reviewed and adequate, no other significant changes in hearing or vision, and only occasionally active with exercise, with excercises at the church twice per wk and running after small grandchildren  Declines pneumonia shots.   Wt Readings from Last 3 Encounters:  07/20/17 200 lb (90.7 kg)  08/14/16 223 lb (101.2 kg)  07/14/16 203 lb (92.1 kg)   Past Medical History:  Diagnosis Date  . Allergic rhinitis, cause unspecified 12/26/2010  . Cataract    1 eye-? which  . DEGENERATIVE JOINT DISEASE 11/25/2006   in knees   . DEPRESSION 11/25/2006  . DIVERTICULOSIS, COLON 12/04/2006  . DIZZINESS 11/18/2009  . DJD (degenerative joint disease)    Bilateral Hip  . Headache    uses aleve for headache   . HYPERCHOLESTEROLEMIA 11/25/2006  . HYPERLIPIDEMIA 11/18/2009  . HYPERPARATHYROIDISM, HX OF 11/25/2006  . HYPERTENSION 07/15/2007  . HYPOTHYROIDISM 11/25/2006  . OBESITY 12/04/2006  . OSTEOPENIA 11/25/2006  . OTITIS MEDIA, ACUTE, LEFT 11/18/2009  . PALPITATIONS, RECURRENT 07/20/2008  . POSTMENOPAUSAL STATUS 11/25/2006  . PULMONARY NODULE 11/25/2006  . Vitamin D deficiency    Past Surgical History:  Procedure Laterality Date  . ABDOMINAL HYSTERECTOMY      . COLONOSCOPY    . left knee    . PARATHYROIDECTOMY Right 11/20/2014   Procedure: RIGHT PARATHYROIDECTOMY;  Surgeon: Darnell Level, MD;  Location: [facility] OR;  Service: General;  Laterality: Right;  . POLYPECTOMY    . right wrist    . THYROID SURGERY     early 70's  . TOTAL HIP ARTHROPLASTY     left 3/05, right 2002  . UMBILICAL HERNIA REPAIR      reports that she quit smoking about 49 years ago. She quit after 1.00 year of use. She quit smokeless tobacco use about 42 years ago. She reports that she does not drink alcohol or use drugs. family history includes Breast cancer in her mother; Hypertension in her other; Stroke in her father. No Known Allergies Current Outpatient Medications on File Prior to Visit  Medication Sig Dispense Refill  . alendronate (FOSAMAX) 70 MG tablet TAKE 1 TABLET 1 TIME EVERY WEEK (ON SUNDAY MORNINGS) WITH A FULL GLASS OF WATER ON AN EMPTY STOMACH  12 tablet 1  . aspirin 81 MG EC tablet Take 81 mg by mouth daily.     . cholecalciferol (VITAMIN D) 1000 UNITS tablet Take 1,000 Units by mouth 2 (two) times daily.     Marland Kitchen levothyroxine (SYNTHROID, LEVOTHROID) 75 MCG tablet TAKE 1 TABLET (75 MCG TOTAL) BY MOUTH DAILY. 90 tablet 1  . lovastatin (MEVACOR) 20 MG tablet TAKE 1 TABLET EVERY DAY 90 tablet 0  . Naproxen Sodium (ALEVE) 220 MG CAPS Take 440 mg by mouth daily as needed (  pain).     Marland Kitchen. OVER THE COUNTER MEDICATION Place 1 drop into both eyes daily as needed (dry eyes). Over the counter lubricating eye drops    . psyllium (HYDROCIL/METAMUCIL) 95 % PACK Take 1 packet by mouth daily as needed (constipation).     . valsartan (DIOVAN) 80 MG tablet TAKE 1 TABLET ONE TIME DAILY 90 tablet 2   No current facility-administered medications on file prior to visit.    Review of Systems Constitutional: Negative for other unusual diaphoresis, sweats, appetite or weight changes HENT: Negative for other worsening hearing loss, ear pain, facial swelling, mouth sores or neck stiffness.    Eyes: Negative for other worsening pain, redness or other visual disturbance.  Respiratory: Negative for other stridor or swelling Cardiovascular: Negative for other palpitations or other chest pain  Gastrointestinal: Negative for worsening diarrhea or loose stools, blood in stool, distention or other pain Genitourinary: Negative for hematuria, flank pain or other change in urine volume.  Musculoskeletal: Negative for myalgias or other joint swelling.  Skin: Negative for other color change, or other wound or worsening drainage.  Neurological: Negative for other syncope or numbness. Hematological: Negative for other adenopathy or swelling Psychiatric/Behavioral: Negative for hallucinations, other worsening agitation, SI, self-injury, or new decreased concentration All other system neg per pt    Objective:   Physical Exam BP 118/78   Pulse 100   Temp 98.8 F (37.1 C) (Oral)   Ht 4\' 11"  (1.499 m)   Wt 200 lb (90.7 kg)   SpO2 97%   BMI 40.40 kg/m  VS noted, obese, using 3 wheeled walker for stability Constitutional: Pt is oriented to person, place, and time. Appears well-developed and well-nourished, in no significant distress and comfortable Head: Normocephalic and atraumatic  Eyes: Conjunctivae and EOM are normal. Pupils are equal, round, and reactive to light Right Ear: External ear normal without discharge Left Ear: External ear normal without discharge Nose: Nose without discharge or deformity Mouth/Throat: Oropharynx is without other ulcerations and moist  Neck: Normal range of motion. Neck supple. No JVD present. No tracheal deviation present or significant neck LA or mass Cardiovascular: Normal rate, regular rhythm, normal heart sounds and intact distal pulses.   Pulmonary/Chest: WOB normal and breath sounds without rales or wheezing  Abdominal: Soft. Bowel sounds are normal. NT. No HSM  Musculoskeletal: Normal range of motion. Exhibits no edema except large varicose asympt  vein post right calf with trace RLE edema Lymphadenopathy: Has no other cervical adenopathy.  Neurological: Pt is alert and oriented to person, place, and time. Pt has normal reflexes. No cranial nerve deficit. Motor grossly intact, Gait intact Skin: Skin is warm and dry. No rash noted or new ulcerations Psychiatric:  Has normal mood and affect. Behavior is normal without agitation No other exam findings  Lab Results  Component Value Date   WBC 3.9 (L) 07/14/2016   HGB 13.2 07/14/2016   HCT 40.4 07/14/2016   PLT 256.0 07/14/2016   GLUCOSE 106 (H) 07/14/2016   CHOL 182 07/14/2016   TRIG 54.0 07/14/2016   HDL 64.60 07/14/2016   LDLDIRECT 118.5 01/01/2012   LDLCALC 107 (H) 07/14/2016   ALT 9 07/14/2016   AST 16 07/14/2016   NA 140 07/14/2016   K 4.5 07/14/2016   CL 106 07/14/2016   CREATININE 0.86 07/14/2016   BUN 13 07/14/2016   CO2 27 07/14/2016   TSH 0.59 07/14/2016       Assessment & Plan:

## 2017-07-20 NOTE — Patient Instructions (Signed)

## 2017-07-20 NOTE — Assessment & Plan Note (Signed)

## 2017-07-20 NOTE — Assessment & Plan Note (Signed)
stable overall by history and exam, recent data reviewed with pt, and pt to continue medical treatment as before,  to f/u any worsening symptoms or concerns BP Readings from Last 3 Encounters:  07/20/17 118/78  08/14/16 124/68  07/14/16 118/76

## 2017-08-24 ENCOUNTER — Ambulatory Visit (INDEPENDENT_AMBULATORY_CARE_PROVIDER_SITE_OTHER): Payer: Medicare HMO | Admitting: *Deleted

## 2017-08-24 VITALS — BP 142/84 | HR 94 | Resp 18 | Ht 59.0 in | Wt 198.0 lb

## 2017-08-24 DIAGNOSIS — R269 Unspecified abnormalities of gait and mobility: Secondary | ICD-10-CM | POA: Diagnosis not present

## 2017-08-24 DIAGNOSIS — Z0000 Encounter for general adult medical examination without abnormal findings: Secondary | ICD-10-CM | POA: Diagnosis not present

## 2017-08-24 NOTE — Addendum Note (Signed)
Addended by: Blanchie ServeWINE, Kennede Lusk A on: 08/24/2017 12:29 PM   Modules accepted: Orders

## 2017-08-24 NOTE — Patient Instructions (Addendum)
[email or web address] or down load app on smart phone  Aunt Berenice Primas website lists multiple social resources for individuals such as: food, health, money, house hold goods, transit, medical supplies, job training and legal services.  Continue doing brain stimulating activities (puzzles, reading, adult coloring books, staying active) to keep memory sharp.   Continue to eat heart healthy diet (full of fruits, vegetables, whole grains, lean protein, water--limit salt, fat, and sugar intake) and increase physical activity as tolerated.   [name] , Thank you for taking time to come for your Medicare Wellness Visit. I appreciate your ongoing commitment to your health goals. Please review the following plan we discussed and let me know if I can assist you in the future.   These are the goals we discussed: Goals    . Exercise 150 minutes per week (moderate activity)     Will go back to the [facility]; Will start back next week. Will continue to go to the church 2 days a week and will [facility] the [facility] on Tuesdays and Fridays     . Patient Stated     Continue to exercise at church, try to get out of the house more by catching the [employer] bus and go shopping at [employer]       This is a list of the screening recommended for you and due dates:  Health Maintenance  Topic Date Due  . Pneumonia vaccines (1 of 2 - PCV13) 07/21/2018*  . Flu Shot  09/16/2017  . Mammogram  09/26/2018  . Tetanus Vaccine  11/19/2019  . Colon Cancer Screening  04/01/2023  . DEXA scan (bone density measurement)  Completed  .  Hepatitis C: One time screening is recommended by Center for Disease Control  (CDC) for  adults born from [birth year] through 1967.   Completed  *Topic was postponed. The date shown is not the original due date.   It is important to avoid accidents which may result in broken bones.  Here are a few ideas on how to make your home safer so you will be less likely to trip or fall.  1. Use nonskid mats or non slip  strips in your shower or tub, on your bathroom floor and around sinks.  If you know that you have spilled water, wipe it up! 2. In the bathroom, it is important to have properly installed grab bars on the walls or on the edge of the tub.  Towel racks are NOT strong enough for you to hold onto or to pull on for support. 3. Stairs and hallways should have enough light.  Add lamps or night lights if you need ore light. 4. It is good to have handrails on both sides of the stairs if possible.  Always fix broken handrails right away. 5. It is important to see the edges of steps.  Paint the edges of outdoor steps white so you can see them better.  Put colored tape on the edge of inside steps. 6. Throw-rugs are dangerous because they can slide.  Removing the rugs is the best idea, but if they must stay, add adhesive carpet tape to prevent slipping. 7. Do not keep things on stairs or in the halls.  Remove small furniture that blocks the halls as it may cause you to trip.  Keep telephone and electrical cords out of the way where you walk. 8. Always were sturdy, rubber-soled shoes for good support.  Never wear just socks, especially on the stairs.  Socks may  cause you to slip or fall.  Do not wear full-length housecoats as you can easily trip on the bottom.  9. Place the things you use the most on the shelves that are the easiest to reach.  If you use a stepstool, make sure it is in good condition.  If you feel unsteady, DO NOT climb, ask for help. 10. If a health professional advises you to use a cane or walker, do not be ashamed.  These items can keep you from falling and breaking your bones.  Health Maintenance, Female Adopting a healthy lifestyle and getting preventive care can go a long way to promote health and wellness. Talk with your health care provider about what schedule of regular examinations is right for you. This is a good chance for you to check in with your provider about disease prevention and  staying healthy. In between checkups, there are plenty of things you can do on your own. Experts have done a lot of research about which lifestyle changes and preventive measures are most likely to keep you healthy. Ask your health care provider for more information. Weight and diet Eat a healthy diet Be sure to include plenty of vegetables, fruits, low-fat dairy products, and lean protein. Do not eat a lot of foods high in solid fats, added sugars, or salt. Get regular exercise. This is one of the most important things you can do for your health. Most adults should exercise for at least 150 minutes each week. The exercise should increase your heart rate and make you sweat (moderate-intensity exercise). Most adults should also do strengthening exercises at least twice a week. This is in addition to the moderate-intensity exercise.  Maintain a healthy weight Body mass index (BMI) is a measurement that can be used to identify possible weight problems. It estimates body fat based on height and weight. Your health care provider can help determine your BMI and help you achieve or maintain a healthy weight. For females 80 years of age and older: A BMI below 18.5 is considered underweight. A BMI of 18.5 to 24.9 is normal. A BMI of 25 to 29.9 is considered overweight. A BMI of 30 and above is considered obese.  Watch levels of cholesterol and blood lipids You should start having your blood tested for lipids and cholesterol at 74 years of age, then have this test every 5 years. You may need to have your cholesterol levels checked more often if: Your lipid or cholesterol levels are high. You are older than 73 years of age. You are at high risk for heart disease.  Cancer screening Lung Cancer Lung cancer screening is recommended for adults 56-46 years old who are at high risk for lung cancer because of a history of smoking. A yearly low-dose CT scan of the lungs is recommended for people  who: Currently smoke. Have quit within the past 15 years. Have at least a 30-pack-year history of smoking. A pack year is smoking an average of one pack of cigarettes a day for 1 year. Yearly screening should continue until it has been 15 years since you quit. Yearly screening should stop if you develop a health problem that would prevent you from having lung cancer treatment.  Breast Cancer Practice breast self-awareness. This means understanding how your breasts normally appear and feel. It also means doing regular breast self-exams. Let your health care provider know about any changes, no matter how small. If you are in your 20s or 30s, you should have  a clinical breast exam (CBE) by a health care provider every 1-3 years as part of a regular health exam. If you are 22 or older, have a CBE every year. Also consider having a breast X-ray (mammogram) every year. If you have a family history of breast cancer, talk to your health care provider about genetic screening. If you are at high risk for breast cancer, talk to your health care provider about having an MRI and a mammogram every year. Breast cancer gene (BRCA) assessment is recommended for women who have family members with BRCA-related cancers. BRCA-related cancers include: Breast. Ovarian. Tubal. Peritoneal cancers. Results of the assessment will determine the need for genetic counseling and BRCA1 and BRCA2 testing.  Cervical Cancer Your health care provider may recommend that you be screened regularly for cancer of the pelvic organs (ovaries, uterus, and vagina). This screening involves a pelvic examination, including checking for microscopic changes to the surface of your cervix (Pap test). You may be encouraged to have this screening done every 3 years, beginning at age 53. For women ages 9-65, health care providers may recommend pelvic exams and Pap testing every 3 years, or they may recommend the Pap and pelvic exam, combined with  testing for human papilloma virus (HPV), every 5 years. Some types of HPV increase your risk of cervical cancer. Testing for HPV may also be done on women of any age with unclear Pap test results. Other health care providers may not recommend any screening for nonpregnant women who are considered low risk for pelvic cancer and who do not have symptoms. Ask your health care provider if a screening pelvic exam is right for you. If you have had past treatment for cervical cancer or a condition that could lead to cancer, you need Pap tests and screening for cancer for at least 20 years after your treatment. If Pap tests have been discontinued, your risk factors (such as having a new sexual partner) need to be reassessed to determine if screening should resume. Some women have medical problems that increase the chance of getting cervical cancer. In these cases, your health care provider may recommend more frequent screening and Pap tests.  Colorectal Cancer This type of cancer can be detected and often prevented. Routine colorectal cancer screening usually begins at 74 years of age and continues through 74 years of age. Your health care provider may recommend screening at an earlier age if you have risk factors for colon cancer. Your health care provider may also recommend using home test kits to check for hidden blood in the stool. A small camera at the end of a tube can be used to examine your colon directly (sigmoidoscopy or colonoscopy). This is done to check for the earliest forms of colorectal cancer. Routine screening usually begins at age 34. Direct examination of the colon should be repeated every 5-10 years through 74 years of age. However, you may need to be screened more often if early forms of precancerous polyps or small growths are found.  Skin Cancer Check your skin from head to toe regularly. Tell your health care provider about any new moles or changes in moles, especially if there is a  change in a mole's shape or color. Also tell your health care provider if you have a mole that is larger than the size of a pencil eraser. Always use sunscreen. Apply sunscreen liberally and repeatedly throughout the day. Protect yourself by wearing long sleeves, pants, a wide-brimmed hat, and sunglasses whenever you  are outside.  Heart disease, diabetes, and high blood pressure High blood pressure causes heart disease and increases the risk of stroke. High blood pressure is more likely to develop in: People who have blood pressure in the high end of the normal range (130-139/85-89 mm Hg). People who are overweight or obese. People who are African American. If you are 12-49 years of age, have your blood pressure checked every 3-5 years. If you are 79 years of age or older, have your blood pressure checked every year. You should have your blood pressure measured twice-once when you are at a hospital or clinic, and once when you are not at a hospital or clinic. Record the average of the two measurements. To check your blood pressure when you are not at a hospital or clinic, you can use: An automated blood pressure machine at a pharmacy. A home blood pressure monitor. If you are between 53 years and 57 years old, ask your health care provider if you should take aspirin to prevent strokes. Have regular diabetes screenings. This involves taking a blood sample to check your fasting blood sugar level. If you are at a normal weight and have a low risk for diabetes, have this test once every three years after 74 years of age. If you are overweight and have a high risk for diabetes, consider being tested at a younger age or more often. Preventing infection Hepatitis B If you have a higher risk for hepatitis B, you should be screened for this virus. You are considered at high risk for hepatitis B if: You were born in a country where hepatitis B is common. Ask your health care provider which countries are  considered high risk. Your parents were born in a high-risk country, and you have not been immunized against hepatitis B (hepatitis B vaccine). You have HIV or AIDS. You use needles to inject street drugs. You live with someone who has hepatitis B. You have had sex with someone who has hepatitis B. You get hemodialysis treatment. You take certain medicines for conditions, including cancer, organ transplantation, and autoimmune conditions.  Hepatitis C Blood testing is recommended for: Everyone born from [birth year] through 1967. Anyone with known risk factors for hepatitis C.  Sexually transmitted infections (STIs) You should be screened for sexually transmitted infections (STIs) including gonorrhea and chlamydia if: You are sexually active and are younger than 74 years of age. You are older than 74 years of age and your health care provider tells you that you are at risk for this type of infection. Your sexual activity has changed since you were last screened and you are at an increased risk for chlamydia or gonorrhea. Ask your health care provider if you are at risk. If you do not have HIV, but are at risk, it may be recommended that you take a prescription medicine daily to prevent HIV infection. This is called pre-exposure prophylaxis (PrEP). You are considered at risk if: You are sexually active and do not regularly use condoms or know the HIV status of your partner(s). You take drugs by injection. You are sexually active with a partner who has HIV.  Talk with your health care provider about whether you are at high risk of being infected with HIV. If you choose to begin PrEP, you should first be tested for HIV. You should then be tested every 3 months for as long as you are taking PrEP. Pregnancy If you are premenopausal and you may become pregnant, ask your health  care provider about preconception counseling. If you may become pregnant, take 400 to 800 micrograms (mcg) of folic acid every  day. If you want to prevent pregnancy, talk to your health care provider about birth control (contraception). Osteoporosis and menopause Osteoporosis is a disease in which the bones lose minerals and strength with aging. This can result in serious bone fractures. Your risk for osteoporosis can be identified using a bone density scan. If you are 87 years of age or older, or if you are at risk for osteoporosis and fractures, ask your health care provider if you should be screened. Ask your health care provider whether you should take a calcium or vitamin D supplement to lower your risk for osteoporosis. Menopause may have certain physical symptoms and risks. Hormone replacement therapy may reduce some of these symptoms and risks. Talk to your health care provider about whether hormone replacement therapy is right for you. Follow these instructions at home: Schedule regular health, dental, and eye exams. Stay current with your immunizations. Do not use any tobacco products including cigarettes, chewing tobacco, or electronic cigarettes. If you are pregnant, do not drink alcohol. If you are breastfeeding, limit how much and how often you drink alcohol. Limit alcohol intake to no more than 1 drink per day for nonpregnant women. One drink equals 12 ounces of beer, 5 ounces of [name], or 1 ounces of hard liquor. Do not use street drugs. Do not share needles. Ask your health care provider for help if you need support or information about quitting drugs. Tell your health care provider if you often feel depressed. Tell your health care provider if you have ever been abused or do not feel safe at home. This information is not intended to replace advice given to you by your health care provider. Make sure you discuss any questions you have with your health care provider. Document Released: 08/18/2010 Document Revised: 07/11/2015 Document Reviewed: 11/06/2014 Elsevier Interactive Patient Education  [employer].

## 2017-08-24 NOTE — Progress Notes (Addendum)
Subjective:   [name] is a 72 y.o. female who presents for Medicare Annual (Subsequent) preventive examination.  Review of Systems:  No ROS.  Medicare Wellness Visit. Additional risk factors are reflected in the social history.  Cardiac Risk Factors include: advanced age (>18men, >37 women);dyslipidemia;hypertension;obesity (BMI >30kg/m2) .Sleep patterns: feels rested on waking, gets up 2-3 times nightly to void and sleeps 7-8 hours nightly.    Home Safety/Smoke Alarms: Feels safe in home. Smoke alarms in place.  Living environment; residence and Firearm Safety: apartment, no firearms. Lives alone,  needs tub bench DME, good support system Seat Belt Safety/Bike Helmet: Wears seat belt.    Objective:     Vitals: BP (!) 142/84   Pulse 94   Resp 18   Ht 4\' 11"  (1.499 m)   Wt 198 lb (89.8 kg)   SpO2 99%   BMI 39.99 kg/m   Body mass index is 39.99 kg/m.  Advanced Directives 08/24/2017 08/14/2016 01/14/2015 11/12/2014  Does Patient Have a Medical Advance Directive? No No No No  Would patient like information on creating a medical advance directive? No - Patient declined Yes (ED - Information included in AVS) Yes - Educational materials given Yes - Transport planner given    Tobacco Social History   Tobacco Use  Smoking Status Former Smoker  . Years: 1.00  . Last attempt to quit: 02/17/1968  . Years since quitting: 49.5  Smokeless Tobacco Former Neurosurgeon  . Quit date: 11/12/1974  Tobacco Comment   tried snuff as a child, casual smoker as young adult     Counseling given: Not Answered Comment: tried snuff as a child, casual smoker as young adult  Past Medical History:  Diagnosis Date  . Allergic rhinitis, cause unspecified 12/26/2010  . Cataract    1 eye-? which  . DEGENERATIVE JOINT DISEASE 11/25/2006   in knees   . DEPRESSION 11/25/2006  . DIVERTICULOSIS, COLON 12/04/2006  . DIZZINESS 11/18/2009  . DJD (degenerative joint disease)    Bilateral Hip  . Headache      uses aleve for headache   . HYPERCHOLESTEROLEMIA 11/25/2006  . HYPERLIPIDEMIA 11/18/2009  . HYPERPARATHYROIDISM, HX OF 11/25/2006  . HYPERTENSION 07/15/2007  . HYPOTHYROIDISM 11/25/2006  . OBESITY 12/04/2006  . OSTEOPENIA 11/25/2006  . OTITIS MEDIA, ACUTE, LEFT 11/18/2009  . PALPITATIONS, RECURRENT 07/20/2008  . POSTMENOPAUSAL STATUS 11/25/2006  . PULMONARY NODULE 11/25/2006  . Vitamin D deficiency    Past Surgical History:  Procedure Laterality Date  . ABDOMINAL HYSTERECTOMY    . COLONOSCOPY    . left knee    . PARATHYROIDECTOMY Right 11/20/2014   Procedure: RIGHT PARATHYROIDECTOMY;  Surgeon: Darnell Level, MD;  Location: [facility];  Service: General;  Laterality: Right;  . POLYPECTOMY    . right wrist    . THYROID SURGERY     early 70's  . TOTAL HIP ARTHROPLASTY     left 3/05, right 2002  . UMBILICAL HERNIA REPAIR     Family History  Problem Relation Age of Onset  . Breast cancer Mother   . Stroke Father   . Hypertension Other   . Colon cancer Neg Hx   . Esophageal cancer Neg Hx   . Rectal cancer Neg Hx   . Stomach cancer Neg Hx    Social History   Socioeconomic History  . Marital status: Legally Separated    Spouse name: Not on file  . Number of children: 2  . Years of education: Not on file  .  Highest education level: Not on file  Occupational History  . Occupation: retired  Engineer, productionocial Needs  . Financial resource strain: Not hard at all  . Food insecurity:    Worry: Never true    Inability: Never true  . Transportation needs:    Medical: No    Non-medical: No  Tobacco Use  . Smoking status: Former Smoker    Years: 1.00    Last attempt to quit: 02/17/1968    Years since quitting: 49.5  . Smokeless tobacco: Former NeurosurgeonUser    Quit date: 11/12/1974  . Tobacco comment: tried snuff as a child, casual smoker as young adult  Substance and Sexual Activity  . Alcohol use: No    Alcohol/week: 0.0 oz  . Drug use: No  . Sexual activity: Never  Lifestyle  . Physical activity:     Days per week: 2 days    Minutes per session: 50 min  . Stress: Not at all  Relationships  . Social connections:    Talks on phone: More than three times a week    Gets together: More than three times a week    Attends religious service: More than 4 times per year    Active member of club or organization: Yes    Attends meetings of clubs or organizations: More than 4 times per year    Relationship status: Separated  Other Topics Concern  . Not on file  Social History Narrative  . Not on file    Outpatient Encounter Medications as of 08/24/2017  Medication Sig  . alendronate (FOSAMAX) 70 MG tablet TAKE 1 TABLET 1 TIME EVERY WEEK (ON SUNDAY MORNINGS) WITH A FULL GLASS OF WATER ON AN EMPTY STOMACH   . aspirin 81 MG EC tablet Take 81 mg by mouth daily.   . cholecalciferol (VITAMIN D) 1000 UNITS tablet Take 1,000 Units by mouth 2 (two) times daily.   Marland Kitchen. levothyroxine (SYNTHROID, LEVOTHROID) 75 MCG tablet TAKE 1 TABLET EVERY DAY  . lovastatin (MEVACOR) 20 MG tablet TAKE 1 TABLET EVERY DAY  . Naproxen Sodium (ALEVE) 220 MG CAPS Take 440 mg by mouth daily as needed (pain).   Marland Kitchen. OVER THE COUNTER MEDICATION Place 1 drop into both eyes daily as needed (dry eyes). Over the counter lubricating eye drops  . psyllium (HYDROCIL/METAMUCIL) 95 % PACK Take 1 packet by mouth daily as needed (constipation).   . valsartan (DIOVAN) 80 MG tablet TAKE 1 TABLET EVERY DAY   No facility-administered encounter medications on file as of 08/24/2017.     Activities of Daily Living In your present state of health, do you have any difficulty performing the following activities: 08/24/2017  Hearing? N  Vision? N  Difficulty concentrating or making decisions? N  Walking or climbing stairs? N  Dressing or bathing? N  Doing errands, shopping? N  Preparing Food and eating ? N  Using the Toilet? N  In the past six months, have you accidently leaked urine? N  Do you have problems with loss of bowel control? N  Managing  your Medications? N  Managing your Finances? N  Housekeeping or managing your Housekeeping? N  Some recent data might be hidden    Patient Care Team: Corwin LevinsJohn, James W, MD as PCP - General    Assessment:   This is a routine wellness [facility]. Physical assessment deferred to PCP.   Exercise Activities and Dietary recommendations Current Exercise Habits: Structured exercise class;Home exercise routine, Type of exercise: calisthenics;stretching;walking, Time (Minutes):  40, Frequency (Times/Week): 3, Weekly Exercise (Minutes/Week): 120, Intensity: Mild, Exercise limited by: orthopedic condition(s)  Diet (meal preparation, eat out, water intake, caffeinated beverages, dairy products, fruits and vegetables): in general, a "healthy" diet  , on average, 1 meals per day   Goals    . Exercise 150 minutes per week (moderate activity)     Will go back to the [facility]; Will start back next week. Will continue to go to the church 2 days a week and will [facility] on Tuesdays and Fridays     . Patient Stated     Continue to exercise at church, try to get out of the house more by catching the [employer] bus and go shopping at [employer].       Fall Risk Fall Risk  08/24/2017 07/20/2017 08/14/2016 07/16/2015 01/16/2015  Falls in the past year? No No No No No  Number falls in past yr: - - - - -  Injury with Fall? - - - - -  Comment - - - - -  Risk for fall due to : - - Impaired mobility;Impaired balance/gait - -  Risk for fall due to: Comment - - - - -  Follow up - - - - -    Depression Screen PHQ 2/9 Scores 08/24/2017 07/20/2017 08/14/2016 07/14/2016  PHQ - 2 Score 1 0 2 0  PHQ- 9 Score 3 - 3 -     Cognitive Function MMSE - Mini Mental State Exam 08/24/2017 08/14/2016 01/14/2015  Orientation to time 5 5 5   Orientation to Place 5 5 5   Registration 3 3 3   Attention/ Calculation 5 4 2   Recall 2 2 2   Language- name 2 objects 2 2 2   Language- repeat 1 1 1   Language- follow 3 step command 3 3 3    Language- read & follow direction 1 1 1   Write a sentence 1 1 1   Copy design 1 1 1   Total score 29 28 26         Immunization History  Administered Date(s) Administered  . Td 07/21/1995, 11/18/2009      Screening Tests Health Maintenance  Topic Date Due  . PNA vac Low Risk Adult (1 of 2 - PCV13) 07/21/2018 (Originally 08/20/2008)  . INFLUENZA VACCINE  09/16/2017  . MAMMOGRAM  09/26/2018  . TETANUS/TDAP  11/19/2019  . COLONOSCOPY  04/01/2023  . DEXA SCAN  Completed  . Hepatitis C Screening  Completed       Plan:   An order for a tub bench was placed for patient to ensure safety and to help maintain independence of ADLs.  Continue doing brain stimulating activities (puzzles, reading, adult coloring books, staying active) to keep memory sharp.   Continue to eat heart healthy diet (full of fruits, vegetables, whole grains, lean protein, water--limit salt, fat, and sugar intake) and increase physical activity as tolerated.    I have personally reviewed and noted the following in the patient's chart:   . Medical and social history . Use of alcohol, tobacco or illicit drugs  . Current medications and supplements . Functional ability and status . Nutritional status . Physical activity . Advanced directives . List of other physicians . Vitals . Screenings to include cognitive, depression, and falls . Referrals and appointments  In addition, I have reviewed and discussed with patient certain preventive protocols, quality metrics, and best practice recommendations. A written personalized care plan for preventive services as well as general preventive health recommendations were  provided to patient.     Wanda Plump, RN  08/24/2017   Medical screening examination/treatment/procedure(s) were performed by non-physician practitioner and as supervising physician I was immediately available for consultation/collaboration. I agree with above. Oliver Barre, MD

## 2017-08-25 ENCOUNTER — Other Ambulatory Visit: Payer: Self-pay | Admitting: Obstetrics and Gynecology

## 2017-08-25 DIAGNOSIS — Z1231 Encounter for screening mammogram for malignant neoplasm of breast: Secondary | ICD-10-CM

## 2017-09-08 ENCOUNTER — Other Ambulatory Visit: Payer: Self-pay | Admitting: Internal Medicine

## 2017-09-28 ENCOUNTER — Ambulatory Visit
Admission: RE | Admit: 2017-09-28 | Discharge: 2017-09-28 | Disposition: A | Discharge status: Home / Self Care | Payer: Medicare HMO | Source: Ambulatory Visit | Attending: Obstetrics and Gynecology | Admitting: Obstetrics and Gynecology

## 2017-09-28 DIAGNOSIS — Z1231 Encounter for screening mammogram for malignant neoplasm of breast: Secondary | ICD-10-CM

## 2017-10-12 DIAGNOSIS — H524 Presbyopia: Secondary | ICD-10-CM | POA: Diagnosis not present

## 2017-11-26 ENCOUNTER — Other Ambulatory Visit: Payer: Self-pay | Admitting: Internal Medicine

## 2017-12-07 ENCOUNTER — Other Ambulatory Visit: Payer: Self-pay | Admitting: Internal Medicine

## 2018-02-15 DIAGNOSIS — H52223 Regular astigmatism, bilateral: Secondary | ICD-10-CM | POA: Diagnosis not present

## 2018-02-15 DIAGNOSIS — H524 Presbyopia: Secondary | ICD-10-CM | POA: Diagnosis not present

## 2018-05-10 ENCOUNTER — Other Ambulatory Visit: Payer: Self-pay | Admitting: Internal Medicine

## 2018-06-02 ENCOUNTER — Other Ambulatory Visit: Payer: Self-pay | Admitting: Internal Medicine

## 2018-06-15 ENCOUNTER — Telehealth: Payer: Self-pay

## 2018-06-15 NOTE — Telephone Encounter (Signed)
Copied from CRM [id number]. Topic: General - Other >> Jun 15, 2018  8:38 AM [name] wrote: Reason for CRM: Patient will be mailing forms today for Dr. Jonny Ruiz to sign to give to the garbage company reason to move her trash bins for her to the curb, because she uses a walker. Please notify patient beforehand if there is a fee for form completion.

## 2018-06-17 ENCOUNTER — Other Ambulatory Visit: Payer: Self-pay | Admitting: Internal Medicine

## 2018-06-20 NOTE — Telephone Encounter (Signed)
Patient checking on the status if forms were received, please advise

## 2018-06-21 NOTE — Telephone Encounter (Signed)
I have not received this form.

## 2018-06-22 NOTE — Telephone Encounter (Signed)
Forms were received today via [employer]. PCP has filled out his portion and I have faxed them back to the number provided. I have contacted the pt and left a VM in regard.

## 2018-07-08 ENCOUNTER — Other Ambulatory Visit: Payer: Self-pay | Admitting: Internal Medicine

## 2018-07-12 ENCOUNTER — Telehealth: Payer: Self-pay | Admitting: Internal Medicine

## 2018-07-12 MED ORDER — LOVASTATIN 20 MG PO TABS: 20.0000 mg | ORAL_TABLET | Freq: Every day | ORAL | 1 refills | Status: DC

## 2018-07-12 NOTE — Telephone Encounter (Signed)
Copied from CRM [id number]. Topic: Quick Communication - Rx Refill/Question >> Jul 12, 2018  2:20 PM Rica Koyanagi, Barbee Cough wrote: Medication: lovastatin (MEVACOR) 20 MG tablet  Has the patient contacted their pharmacy? Yes.   (Agent: If no, request that the patient contact the pharmacy for the refill.) (Agent: If yes, when and what did the pharmacy advise?)  Preferred Pharmacy (with phone number or street name): [facility]  Fax is [phone]   Agent: Please be advised that RX refills may take up to 3 business days. We ask that you follow-up with your pharmacy.

## 2018-07-22 ENCOUNTER — Other Ambulatory Visit: Payer: Self-pay

## 2018-07-22 ENCOUNTER — Other Ambulatory Visit (INDEPENDENT_AMBULATORY_CARE_PROVIDER_SITE_OTHER): Payer: Medicare HMO

## 2018-07-22 ENCOUNTER — Ambulatory Visit (INDEPENDENT_AMBULATORY_CARE_PROVIDER_SITE_OTHER): Payer: Medicare HMO | Admitting: Internal Medicine

## 2018-07-22 ENCOUNTER — Encounter: Payer: Self-pay | Admitting: Internal Medicine

## 2018-07-22 VITALS — BP 124/76 | HR 116 | Temp 99.1°F | Ht 59.0 in | Wt 197.0 lb

## 2018-07-22 DIAGNOSIS — E611 Iron deficiency: Secondary | ICD-10-CM

## 2018-07-22 DIAGNOSIS — E538 Deficiency of other specified B group vitamins: Secondary | ICD-10-CM

## 2018-07-22 DIAGNOSIS — E559 Vitamin D deficiency, unspecified: Secondary | ICD-10-CM | POA: Diagnosis not present

## 2018-07-22 DIAGNOSIS — Z0000 Encounter for general adult medical examination without abnormal findings: Secondary | ICD-10-CM

## 2018-07-22 LAB — CBC WITH DIFFERENTIAL/PLATELET
Basophils Absolute: 0 10*3/uL (ref 0.0–0.1)
Basophils Relative: 0.7 % (ref 0.0–3.0)
Eosinophils Absolute: 0.1 10*3/uL (ref 0.0–0.7)
Eosinophils Relative: 3 % (ref 0.0–5.0)
HCT: 39.4 % (ref 36.0–46.0)
Hemoglobin: 13.1 g/dL (ref 12.0–15.0)
Lymphocytes Relative: 18.8 % (ref 12.0–46.0)
Lymphs Abs: 0.9 10*3/uL (ref 0.7–4.0)
MCHC: 33.4 g/dL (ref 30.0–36.0)
MCV: 87.7 fl (ref 78.0–100.0)
Monocytes Absolute: 0.5 10*3/uL (ref 0.1–1.0)
Monocytes Relative: 12 % (ref 3.0–12.0)
Neutro Abs: 3 10*3/uL (ref 1.4–7.7)
Neutrophils Relative %: 65.5 % (ref 43.0–77.0)
Platelets: 288 10*3/uL (ref 150.0–400.0)
RBC: 4.49 Mil/uL (ref 3.87–5.11)
RDW: 13.7 % (ref 11.5–15.5)
WBC: 4.6 10*3/uL (ref 4.0–10.5)

## 2018-07-22 LAB — LIPID PANEL
Cholesterol: 192 mg/dL (ref 0–200)
HDL: 65.4 mg/dL (ref >39.00)
LDL Cholesterol: 111 mg/dL — ABNORMAL HIGH (ref 0–99)
NonHDL: 126.62
Total CHOL/HDL Ratio: 3
Triglycerides: 80 mg/dL (ref 0.0–149.0)
VLDL: 16 mg/dL (ref 0.0–40.0)

## 2018-07-22 LAB — VITAMIN D 25 HYDROXY (VIT D DEFICIENCY, FRACTURES): VITD: 51.79 ng/mL (ref 30.00–100.00)

## 2018-07-22 LAB — IBC PANEL
Iron: 64 ug/dL (ref 42–145)
Saturation Ratios: 20.9 % (ref 20.0–50.0)
Transferrin: 219 mg/dL (ref 212.0–360.0)

## 2018-07-22 LAB — BASIC METABOLIC PANEL
BUN: 10 mg/dL (ref 6–23)
CO2: 29 mEq/L (ref 19–32)
Calcium: 9.3 mg/dL (ref 8.4–10.5)
Chloride: 103 mEq/L (ref 96–112)
Creatinine, Ser: 0.83 mg/dL (ref 0.40–1.20)
GFR: 81.11 mL/min (ref >60.00)
Glucose, Bld: 91 mg/dL (ref 70–99)
Potassium: 3.9 mEq/L (ref 3.5–5.1)
Sodium: 139 mEq/L (ref 135–145)

## 2018-07-22 LAB — HEPATIC FUNCTION PANEL
ALT: 8 U/L (ref 0–35)
AST: 14 U/L (ref 0–37)
Albumin: 4.2 g/dL (ref 3.5–5.2)
Alkaline Phosphatase: 42 U/L (ref 39–117)
Bilirubin, Direct: 0.1 mg/dL (ref 0.0–0.3)
Total Bilirubin: 0.4 mg/dL (ref 0.2–1.2)
Total Protein: 7.8 g/dL (ref 6.0–8.3)

## 2018-07-22 LAB — TSH: TSH: 1.27 u[IU]/mL (ref 0.35–4.50)

## 2018-07-22 LAB — VITAMIN B12: Vitamin B-12: 344 pg/mL (ref 211–911)

## 2018-07-22 NOTE — Patient Instructions (Signed)

## 2018-07-22 NOTE — Progress Notes (Signed)
Subjective:    Patient ID: [name], female    DOB: 03/26/1945, 73 y.o.   MRN: [id number]  HPI  Here for wellness and f/u;  Overall doing ok;  Pt denies Chest pain, worsening SOB, DOE, wheezing, orthopnea, PND, worsening LE edema, palpitations, dizziness or syncope.  Pt denies neurological change such as new headache, facial or extremity weakness.  Pt denies polydipsia, polyuria, or low sugar symptoms. Pt states overall good compliance with treatment and medications, good tolerability, and has been trying to follow appropriate diet.  Pt denies worsening depressive symptoms, suicidal ideation or panic. No fever, night sweats, wt loss, loss of appetite, or other constitutional symptoms.  Pt states good ability with ADL's, has low fall risk, home safety reviewed and adequate, no other significant changes in hearing or vision, and only occasionally active with exercise. Walks with 3 wheel walker. No new complaints Past Medical History:  Diagnosis Date  . Allergic rhinitis, cause unspecified 12/26/2010  . Cataract    1 eye-? which  . DEGENERATIVE JOINT DISEASE 11/25/2006   in knees   . DEPRESSION 11/25/2006  . DIVERTICULOSIS, COLON 12/04/2006  . DIZZINESS 11/18/2009  . DJD (degenerative joint disease)    Bilateral Hip  . Headache    uses aleve for headache   . HYPERCHOLESTEROLEMIA 11/25/2006  . HYPERLIPIDEMIA 11/18/2009  . HYPERPARATHYROIDISM, HX OF 11/25/2006  . HYPERTENSION 07/15/2007  . HYPOTHYROIDISM 11/25/2006  . OBESITY 12/04/2006  . OSTEOPENIA 11/25/2006  . OTITIS MEDIA, ACUTE, LEFT 11/18/2009  . PALPITATIONS, RECURRENT 07/20/2008  . POSTMENOPAUSAL STATUS 11/25/2006  . PULMONARY NODULE 11/25/2006  . Vitamin D deficiency    Past Surgical History:  Procedure Laterality Date  . ABDOMINAL HYSTERECTOMY    . COLONOSCOPY    . left knee    . PARATHYROIDECTOMY Right 11/20/2014   Procedure: RIGHT PARATHYROIDECTOMY;  Surgeon: Darnell Levelodd Gerkin, MD;  [facility];  Service: General;  Laterality:  Right;  . POLYPECTOMY    . right wrist    . THYROID SURGERY     early 70's  . TOTAL HIP ARTHROPLASTY     left 3/05, right 2002  . UMBILICAL HERNIA REPAIR      reports that she quit smoking about 50 years ago. She quit after 1.00 year of use. She quit smokeless tobacco use about 43 years ago. She reports that she does not drink alcohol or use drugs. family history includes Breast cancer in her mother; Hypertension in an other family member; Stroke in her father. No Known Allergies Current Outpatient Medications on File Prior to Visit  Medication Sig Dispense Refill  . alendronate (FOSAMAX) 70 MG tablet TAKE 1 TABLET 1 TIME EVERY WEEK (ON SUNDAY MORNINGS) WITH A FULL GLASS OF WATER ON AN EMPTY STOMACH  12 tablet 1  . aspirin 81 MG EC tablet Take 81 mg by mouth daily.     . cholecalciferol (VITAMIN D) 1000 UNITS tablet Take 1,000 Units by mouth 2 (two) times daily.     Marland Kitchen. levothyroxine (SYNTHROID, LEVOTHROID) 75 MCG tablet TAKE 1 TABLET EVERY DAY 90 tablet 1  . lovastatin (MEVACOR) 20 MG tablet Take 1 tablet (20 mg total) by mouth daily. 90 tablet 1  . Naproxen Sodium (ALEVE) 220 MG CAPS Take 440 mg by mouth daily as needed (pain).     Marland Kitchen. OVER THE COUNTER MEDICATION Place 1 drop into both eyes daily as needed (dry eyes). Over the counter lubricating eye drops    . psyllium (HYDROCIL/METAMUCIL) 95 % PACK  Take 1 packet by mouth daily as needed (constipation).     . valsartan (DIOVAN) 80 MG tablet TAKE 1 TABLET EVERY DAY 90 tablet 2   No current facility-administered medications on file prior to visit.    Review of Systems Constitutional: Negative for other unusual diaphoresis, sweats, appetite or weight changes HENT: Negative for other worsening hearing loss, ear pain, facial swelling, mouth sores or neck stiffness.   Eyes: Negative for other worsening pain, redness or other visual disturbance.  Respiratory: Negative for other stridor or swelling Cardiovascular: Negative for other  palpitations or other chest pain  Gastrointestinal: Negative for worsening diarrhea or loose stools, blood in stool, distention or other pain Genitourinary: Negative for hematuria, flank pain or other change in urine volume.  Musculoskeletal: Negative for myalgias or other joint swelling.  Skin: Negative for other color change, or other wound or worsening drainage.  Neurological: Negative for other syncope or numbness. Hematological: Negative for other adenopathy or swelling Psychiatric/Behavioral: Negative for hallucinations, other worsening agitation, SI, self-injury, or new decreased concentration All other system neg per pt    Objective:   Physical Exam BP 124/76   Pulse (!) 116   Temp 99.1 F (37.3 C) (Oral)   Ht 4\' 11"  (1.499 m)   Wt 197 lb (89.4 kg)   SpO2 96%   BMI 39.79 kg/m  VS noted,  Constitutional: Pt is oriented to person, place, and time. Appears well-developed and well-nourished, in no significant distress and comfortable Head: Normocephalic and atraumatic  Eyes: Conjunctivae and EOM are normal. Pupils are equal, round, and reactive to light Right Ear: External ear normal without discharge Left Ear: External ear normal without discharge Nose: Nose without discharge or deformity Mouth/Throat: Oropharynx is without other ulcerations and moist  Neck: Normal range of motion. Neck supple. No JVD present. No tracheal deviation present or significant neck LA or mass Cardiovascular: Normal rate, regular rhythm, normal heart sounds and intact distal pulses.   Pulmonary/Chest: WOB normal and breath sounds without rales or wheezing  Abdominal: Soft. Bowel sounds are normal. NT. No HSM  Musculoskeletal: Normal range of motion. Exhibits no edema Lymphadenopathy: Has no other cervical adenopathy.  Neurological: Pt is alert and oriented to person, place, and time. Pt has normal reflexes. No cranial nerve deficit. Motor grossly intact, Gait intact Skin: Skin is warm and dry. No  rash noted or new ulcerations Psychiatric:  Has normal mood and affect. Behavior is normal without agitation No other exam findings Lab Results  Component Value Date   WBC 4.6 07/22/2018   HGB 13.1 07/22/2018   HCT 39.4 07/22/2018   PLT 288.0 07/22/2018   GLUCOSE 91 07/22/2018   CHOL 192 07/22/2018   TRIG 80.0 07/22/2018   HDL 65.40 07/22/2018   LDLDIRECT 118.5 01/01/2012   LDLCALC 111 (H) 07/22/2018   ALT 8 07/22/2018   AST 14 07/22/2018   NA 139 07/22/2018   K 3.9 07/22/2018   CL 103 07/22/2018   CREATININE 0.83 07/22/2018   BUN 10 07/22/2018   CO2 29 07/22/2018   TSH 1.27 07/22/2018       Assessment & Plan:

## 2018-07-24 ENCOUNTER — Encounter: Payer: Self-pay | Admitting: Internal Medicine

## 2018-07-24 NOTE — Assessment & Plan Note (Signed)

## 2018-08-29 NOTE — Progress Notes (Addendum)
Subjective:   [name] is a 73 y.o. female who presents for Medicare Annual (Subsequent) preventive examination. I connected with patient by a telephone and verified that I am speaking with the correct person using two identifiers. Patient stated full name and DOB. Patient gave permission to continue with telephonic visit. Patient's location was at home and Nurse's location was at [place].   Review of Systems:   Cardiac Risk Factors include: advanced age (>4955men, 5>65 women);dyslipidemia;hypertension;obesity (BMI >30kg/m2) Sleep patterns: feels rested on waking, gets up 2-3 times nightly to void and sleeps 6-7 hours nightly.    Home Safety/Smoke Alarms: Feels safe in home. Smoke alarms in place.  Living environment; residence and [place] Safety: apartment, equipment: Walkers, Type: Agricultural consultantolling Walker. Lives alone, no needs for DME, good support system Seat Belt Safety/Bike Helmet: Wears seat belt.      Objective:     Vitals: There were no vitals taken for this visit.  There is no height or weight on file to calculate BMI.  Advanced Directives 08/30/2018 08/24/2017 08/14/2016 01/14/2015 11/12/2014  Does Patient Have a Medical Advance Directive? No No No No No  Would patient like information on creating a medical advance directive? Yes (ED - Information included in AVS) No - Patient declined Yes (ED - Information included in AVS) Yes - Educational materials given Yes - Educational materials given    Tobacco Social History   Tobacco Use  Smoking Status Former Smoker  . Years: 1.00  . Quit date: 02/17/1968  . Years since quitting: 50.5  Smokeless Tobacco Former NeurosurgeonUser  . Quit date: 11/12/1974  Tobacco Comment   tried snuff as a child, casual smoker as young adult     Counseling given: Not Answered Comment: tried snuff as a child, casual smoker as young adult  Past Medical History:  Diagnosis Date  . Allergic rhinitis, cause unspecified 12/26/2010  . Cataract    1 eye-? which   . DEGENERATIVE JOINT DISEASE 11/25/2006   in knees   . DEPRESSION 11/25/2006  . DIVERTICULOSIS, COLON 12/04/2006  . DIZZINESS 11/18/2009  . DJD (degenerative joint disease)    Bilateral Hip  . Headache    uses aleve for headache   . HYPERCHOLESTEROLEMIA 11/25/2006  . HYPERLIPIDEMIA 11/18/2009  . HYPERPARATHYROIDISM, HX OF 11/25/2006  . HYPERTENSION 07/15/2007  . HYPOTHYROIDISM 11/25/2006  . OBESITY 12/04/2006  . OSTEOPENIA 11/25/2006  . OTITIS MEDIA, ACUTE, LEFT 11/18/2009  . PALPITATIONS, RECURRENT 07/20/2008  . POSTMENOPAUSAL STATUS 11/25/2006  . PULMONARY NODULE 11/25/2006  . Vitamin D deficiency    Past Surgical History:  Procedure Laterality Date  . ABDOMINAL HYSTERECTOMY    . COLONOSCOPY    . left knee    . PARATHYROIDECTOMY Right 11/20/2014   Procedure: RIGHT PARATHYROIDECTOMY;  Surgeon: Darnell Levelodd Gerkin, MD;  [facility];  Service: General;  Laterality: Right;  . POLYPECTOMY    . right wrist    . THYROID SURGERY     early 70's  . TOTAL HIP ARTHROPLASTY     left 3/05, right 2002  . TOTAL HIP ARTHROPLASTY Left 2005  . UMBILICAL HERNIA REPAIR     Family History  Problem Relation Age of Onset  . Breast cancer Mother   . Stroke Father   . Hypertension Other   . Colon cancer Neg Hx   . Esophageal cancer Neg Hx   . Rectal cancer Neg Hx   . Stomach cancer Neg Hx    Social History   Socioeconomic History  .  Marital status: Legally Separated    Spouse name: Not on file  . Number of children: 2  . Years of education: Not on file  . Highest education level: Not on file  Occupational History  . Occupation: retired  Engineer, productionocial Needs  . Financial resource strain: Not hard at all  . Food insecurity    Worry: Never true    Inability: Never true  . Transportation needs    Medical: No    Non-medical: No  Tobacco Use  . Smoking status: Former Smoker    Years: 1.00    Quit date: 02/17/1968    Years since quitting: 50.5  . Smokeless tobacco: Former NeurosurgeonUser    Quit date: 11/12/1974   . Tobacco comment: tried snuff as a child, casual smoker as young adult  Substance and Sexual Activity  . Alcohol use: No    Alcohol/week: 0.0 standard drinks  . Drug use: No  . Sexual activity: Never  Lifestyle  . Physical activity    Days per week: 2 days    Minutes per session: 30 min  . Stress: Not at all  Relationships  . Social connections    Talks on phone: More than three times a week    Gets together: More than three times a week    Attends religious service: More than 4 times per year    Active member of club or organization: Yes    Attends meetings of clubs or organizations: More than 4 times per year    Relationship status: Separated  Other Topics Concern  . Not on file  Social History Narrative  . Not on file    Outpatient Encounter Medications as of 08/30/2018  Medication Sig  . alendronate (FOSAMAX) 70 MG tablet TAKE 1 TABLET 1 TIME EVERY WEEK (ON SUNDAY MORNINGS) WITH A FULL GLASS OF WATER ON AN EMPTY STOMACH   . aspirin 81 MG EC tablet Take 81 mg by mouth daily.   . cholecalciferol (VITAMIN D) 1000 UNITS tablet Take 1,000 Units by mouth 2 (two) times daily.   Marland Kitchen. levothyroxine (SYNTHROID, LEVOTHROID) 75 MCG tablet TAKE 1 TABLET EVERY DAY  . lovastatin (MEVACOR) 20 MG tablet Take 1 tablet (20 mg total) by mouth daily.  . Naproxen Sodium (ALEVE) 220 MG CAPS Take 440 mg by mouth daily as needed (pain).   Marland Kitchen. OVER THE COUNTER MEDICATION Place 1 drop into both eyes daily as needed (dry eyes). Over the counter lubricating eye drops  . psyllium (HYDROCIL/METAMUCIL) 95 % PACK Take 1 packet by mouth daily as needed (constipation).   . valsartan (DIOVAN) 80 MG tablet TAKE 1 TABLET EVERY DAY   No facility-administered encounter medications on file as of 08/30/2018.     Activities of Daily Living In your present state of health, do you have any difficulty performing the following activities: 08/30/2018  Hearing? N  Vision? N  Difficulty concentrating or making decisions? N   Walking or climbing stairs? N  Dressing or bathing? N  Doing errands, shopping? N  Preparing Food and eating ? N  Using the Toilet? N  In the past six months, have you accidently leaked urine? N  Do you have problems with loss of bowel control? N  Managing your Medications? N  Managing your Finances? N  Housekeeping or managing your Housekeeping? N  Some recent data might be hidden    Patient Care Team: Corwin LevinsJohn, James W, MD as PCP - General Nyoka CowdenWert, Michael B, MD as Consulting Physician (Pulmonary Disease)  Ollen GrossAluisio, Frank, MD as Consulting Physician (Orthopedic Surgery)    Assessment:   [facility] is a routine wellness examination [facility]. Physical assessment deferred to PCP.  Exercise Activities and Dietary recommendations Current Exercise Habits: Home exercise routine, Type of exercise: walking, Time (Minutes): 30, Frequency (Times/Week): 3, Weekly Exercise (Minutes/Week): 90, Intensity: Mild, Exercise limited by: orthopedic condition(s)  Diet (meal preparation, eat out, water intake, caffeinated beverages, dairy products, fruits and vegetables): in general, a "healthy" diet   Reports poor appetite at times.    Discussed supplementing with Ensure and coupons provided. Encouraged patient to increase daily water and healthy fluid intake.  Goals    . Exercise 150 minutes per week (moderate activity)     Will continue to go to the church 2 days a week and exercise when it reopens. I want to join [employer] again to lose weight and become as healthy as possible.       Fall Risk Fall Risk  08/30/2018 07/22/2018 08/24/2017 07/20/2017 08/14/2016  Falls in the past year? 0 0 No No No  Number falls in past yr: 0 0 - - -  Injury with Fall? - 0 - - -  Comment - - - - -  Risk for fall due to : - - - - Impaired mobility;Impaired balance/gait  Risk for fall due to: Comment - - - - -  Follow up Falls prevention discussed - - - -   Depression Screen PHQ 2/9 Scores 08/30/2018 07/22/2018 08/24/2017  07/20/2017  PHQ - 2 Score 1 0 1 0  PHQ- 9 Score 1 - 3 -     Cognitive Function MMSE - Mini Mental State Exam 08/24/2017 08/14/2016 01/14/2015  Orientation to time 5 5 5   Orientation to Place 5 5 5   Registration 3 3 3   Attention/ Calculation 5 4 2   Recall 2 2 2   Language- name 2 objects 2 2 2   Language- repeat 1 1 1   Language- follow 3 step command 3 3 3   Language- read & follow direction 1 1 1   Write a sentence 1 1 1   Copy design 1 1 1   Total score 29 28 26        Ad8 score reviewed for issues:  Issues making decisions: no  Less interest in hobbies / activities: no  Repeats questions, stories (family complaining): no  Trouble using ordinary gadgets (microwave, computer, phone):no  Forgets the month or year: no  Mismanaging finances: no  Remembering appts: no  Daily problems with thinking and/or memory: no Ad8 score is= 0  Immunization History  Administered Date(s) Administered  . Td 07/21/1995, 11/18/2009    Screening Tests Health Maintenance  Topic Date Due  . PNA vac Low Risk Adult (1 of 2 - PCV13) 07/22/2019 (Originally 08/20/2008)  . INFLUENZA VACCINE  09/17/2018  . TETANUS/TDAP  11/19/2019  . COLONOSCOPY  04/01/2023  . DEXA SCAN  Completed  . Hepatitis C Screening  Completed      Plan:    Reviewed health maintenance screenings with patient today and relevant education, vaccines, and/or referrals were provided.   Continue doing brain stimulating activities (puzzles, reading, adult coloring books, staying active) to keep memory sharp.   Continue to eat heart healthy diet (full of fruits, vegetables, whole grains, lean protein, water--limit salt, fat, and sugar intake) and increase physical activity as tolerated.  I have personally reviewed and noted the following in the patient's chart:   . Medical and social history . Use of alcohol, tobacco or  illicit drugs  . Current medications and supplements . Functional ability and status . Nutritional status  . Physical activity . Advanced directives . List of other physicians . Screenings to include cognitive, depression, and falls . Referrals and appointments  In addition, I have reviewed and discussed with patient certain preventive protocols, quality metrics, and best practice recommendations. A written personalized care plan for preventive services as well as general preventive health recommendations were provided to patient.     Michiel Cowboy, RN  08/30/2018    Medical screening examination/treatment/procedure(s) were performed by non-physician practitioner and as supervising physician I was immediately available for consultation/collaboration. I agree with above. Cathlean Cower, MD

## 2018-08-30 ENCOUNTER — Ambulatory Visit (INDEPENDENT_AMBULATORY_CARE_PROVIDER_SITE_OTHER): Payer: Medicare HMO | Admitting: *Deleted

## 2018-08-30 DIAGNOSIS — Z0000 Encounter for general adult medical examination without abnormal findings: Secondary | ICD-10-CM | POA: Diagnosis not present

## 2018-08-30 NOTE — Patient Instructions (Addendum)
Continue doing brain stimulating activities (puzzles, reading, adult coloring books, staying active) to keep memory sharp.   Continue to eat heart healthy diet (full of fruits, vegetables, whole grains, lean protein, water--limit salt, fat, and sugar intake) and increase physical activity as tolerated.  [name] , Thank you for taking time to come for your Medicare Wellness Visit. I appreciate your ongoing commitment to your health goals. Please review the following plan we discussed and let me know if I can assist you in the future.   These are the goals we discussed: Goals    . Exercise 150 minutes per week (moderate activity)     Will continue to go to the church 2 days a week and exercise when it reopens. I want to join [employer] again to lose weight and become as healthy as possible.       This is a list of the screening recommended for you and due dates:  Health Maintenance  Topic Date Due  . Pneumonia vaccines (1 of 2 - PCV13) 07/22/2019*  . Flu Shot  09/17/2018  . Tetanus Vaccine  11/19/2019  . Colon Cancer Screening  04/01/2023  . DEXA scan (bone density measurement)  Completed  .  Hepatitis C: One time screening is recommended by Center for Disease Control  (CDC) for  adults born from [birth year] through 1967.   Completed  *Topic was postponed. The date shown is not the original due date.    Preventive Care 95 Years and Older, Female Preventive care refers to lifestyle choices and visits with your health care provider that can promote health and wellness. This includes:  A yearly physical exam. This is also called an annual well check.  Regular dental and eye exams.  Immunizations.  Screening for certain conditions.  Healthy lifestyle choices, such as diet and exercise. What can I expect for my preventive care visit? Physical exam Your health care provider will check:  Height and weight. These may be used to calculate body mass index (BMI), which is a measurement  that tells if you are at a healthy weight.  Heart rate and blood pressure.  Your skin for abnormal spots. Counseling Your health care provider may ask you questions about:  Alcohol, tobacco, and drug use.  Emotional well-being.  Home and relationship well-being.  Sexual activity.  Eating habits.  History of falls.  Memory and ability to understand (cognition).  Work and work Statistician.  Pregnancy and menstrual history. What immunizations do I need?  Influenza (flu) vaccine  This is recommended every year. Tetanus, diphtheria, and pertussis (Tdap) vaccine  You may need a Td booster every 10 years. Varicella (chickenpox) vaccine  You may need this vaccine if you have not already been vaccinated. Zoster (shingles) vaccine  You may need this after age 2. Pneumococcal conjugate (PCV13) vaccine  One dose is recommended after age 28. Pneumococcal polysaccharide (PPSV23) vaccine  One dose is recommended after age 64. Measles, mumps, and rubella (MMR) vaccine  You may need at least one dose of MMR if you were born in 1957 or later. You may also need a second dose. Meningococcal conjugate (MenACWY) vaccine  You may need this if you have certain conditions. Hepatitis A vaccine  You may need this if you have certain conditions or if you travel or work in places where you may be exposed to hepatitis A. Hepatitis B vaccine  You may need this if you have certain conditions or if you travel or work in places  where you may be exposed to hepatitis B. Haemophilus influenzae type b (Hib) vaccine  You may need this if you have certain conditions. You may receive vaccines as individual doses or as more than one vaccine together in one shot (combination vaccines). Talk with your health care provider about the risks and benefits of combination vaccines. What tests do I need? Blood tests  Lipid and cholesterol levels. These may be checked every 5 years, or more frequently  depending on your overall health.  Hepatitis C test.  Hepatitis B test. Screening  Lung cancer screening. You may have this screening every year starting at age 69 if you have a 30-pack-year history of smoking and currently smoke or have quit within the past 15 years.  Colorectal cancer screening. All adults should have this screening starting at age 58 and continuing until age 6. Your health care provider may recommend screening at age 64 if you are at increased risk. You will have tests every 1-10 years, depending on your results and the type of screening test.  Diabetes screening. This is done by checking your blood sugar (glucose) after you have not eaten for a while (fasting). You may have this done every 1-3 years.  Mammogram. This may be done every 1-2 years. Talk with your health care provider about how often you should have regular mammograms.  BRCA-related cancer screening. This may be done if you have a family history of breast, ovarian, tubal, or peritoneal cancers. Other tests  Sexually transmitted disease (STD) testing.  Bone density scan. This is done to screen for osteoporosis. You may have this done starting at age 44. Follow these instructions at home: Eating and drinking  Eat a diet that includes fresh fruits and vegetables, whole grains, lean protein, and low-fat dairy products. Limit your intake of foods with high amounts of sugar, saturated fats, and salt.  Take vitamin and mineral supplements as recommended by your health care provider.  Do not drink alcohol if your health care provider tells you not to drink.  If you drink alcohol: ? Limit how much you have to 0-1 drink a day. ? Be aware of how much alcohol is in your drink. In the [place] one drink equals one 12 oz bottle of beer (355 mL), one 5 oz glass of Tityana Pagan (148 mL), or one 1 oz glass of hard liquor (44 mL). Lifestyle  Take daily care of your teeth and gums.  Stay active. Exercise for at least 30  minutes on 5 or more days each week.  Do not use any products that contain nicotine or tobacco, such as cigarettes, e-cigarettes, and chewing tobacco. If you need help quitting, ask your health care provider.  If you are sexually active, practice safe sex. Use a condom or other form of protection in order to prevent STIs (sexually transmitted infections).  Talk with your health care provider about taking a low-dose aspirin or statin. What's next?  Go to your health care provider once a year for a well check visit.  Ask your health care provider how often you should have your eyes and teeth checked.  Stay up to date on all vaccines. This information is not intended to replace advice given to you by your health care provider. Make sure you discuss any questions you have with your health care provider. Document Released: 03/01/2015 Document Revised: 01/27/2018 Document Reviewed: 01/27/2018 Elsevier Patient Education  2020 [employer].

## 2018-09-26 ENCOUNTER — Other Ambulatory Visit: Payer: Self-pay | Admitting: Obstetrics and Gynecology

## 2018-09-26 DIAGNOSIS — Z1231 Encounter for screening mammogram for malignant neoplasm of breast: Secondary | ICD-10-CM

## 2018-10-03 ENCOUNTER — Other Ambulatory Visit: Payer: Self-pay | Admitting: Internal Medicine

## 2018-11-09 DIAGNOSIS — H524 Presbyopia: Secondary | ICD-10-CM | POA: Diagnosis not present

## 2018-11-16 ENCOUNTER — Other Ambulatory Visit: Payer: Self-pay

## 2018-11-16 ENCOUNTER — Other Ambulatory Visit: Payer: Self-pay | Admitting: Internal Medicine

## 2018-11-16 ENCOUNTER — Ambulatory Visit
Admission: RE | Admit: 2018-11-16 | Discharge: 2018-11-16 | Disposition: A | Discharge status: Home / Self Care | Payer: Medicare HMO | Source: Ambulatory Visit | Attending: Obstetrics and Gynecology | Admitting: Obstetrics and Gynecology

## 2018-11-16 DIAGNOSIS — Z1231 Encounter for screening mammogram for malignant neoplasm of breast: Secondary | ICD-10-CM | POA: Diagnosis not present

## 2018-12-01 ENCOUNTER — Other Ambulatory Visit: Payer: Self-pay | Admitting: Internal Medicine

## 2019-01-24 ENCOUNTER — Other Ambulatory Visit: Payer: Self-pay

## 2019-01-24 ENCOUNTER — Ambulatory Visit (INDEPENDENT_AMBULATORY_CARE_PROVIDER_SITE_OTHER): Payer: Medicare HMO | Admitting: Internal Medicine

## 2019-01-24 ENCOUNTER — Encounter: Payer: Self-pay | Admitting: Internal Medicine

## 2019-01-24 VITALS — BP 136/84 | HR 103 | Temp 98.3°F | Ht 59.0 in | Wt 198.0 lb

## 2019-01-24 DIAGNOSIS — E039 Hypothyroidism, unspecified: Secondary | ICD-10-CM | POA: Diagnosis not present

## 2019-01-24 DIAGNOSIS — I10 Essential (primary) hypertension: Secondary | ICD-10-CM

## 2019-01-24 DIAGNOSIS — E785 Hyperlipidemia, unspecified: Secondary | ICD-10-CM | POA: Diagnosis not present

## 2019-01-24 NOTE — Progress Notes (Signed)
Subjective:    Patient ID: [name], female    DOB: 07-Jun-1945, 73 y.o.   MRN: [id number]  HPI  Here to f/u; overall doing ok,  Pt denies chest pain, increasing sob or doe, wheezing, orthopnea, PND, increased LE swelling, palpitations, dizziness or syncope.  Pt denies new neurological symptoms such as new headache, or facial or extremity weakness or numbness.  Pt denies polydipsia, polyuria, or low sugar episode.  Pt states overall good compliance with meds, mostly trying to follow appropriate diet, with wt overall stable,  but little exercise however. Denies hyper or hypo thyroid symptoms such as voice, skin or hair change. Wt Readings from Last 3 Encounters:  07/22/18 197 lb (89.4 kg)  08/24/17 198 lb (89.8 kg)  07/20/17 200 lb (90.7 kg)   Past Medical History:  Diagnosis Date  . Allergic rhinitis, cause unspecified 12/26/2010  . Cataract    1 eye-? which  . DEGENERATIVE JOINT DISEASE 11/25/2006   in knees   . DEPRESSION 11/25/2006  . DIVERTICULOSIS, COLON 12/04/2006  . DIZZINESS 11/18/2009  . DJD (degenerative joint disease)    Bilateral Hip  . Headache    uses aleve for headache   . HYPERCHOLESTEROLEMIA 11/25/2006  . HYPERLIPIDEMIA 11/18/2009  . HYPERPARATHYROIDISM, HX OF 11/25/2006  . HYPERTENSION 07/15/2007  . HYPOTHYROIDISM 11/25/2006  . OBESITY 12/04/2006  . OSTEOPENIA 11/25/2006  . OTITIS MEDIA, ACUTE, LEFT 11/18/2009  . PALPITATIONS, RECURRENT 07/20/2008  . POSTMENOPAUSAL STATUS 11/25/2006  . PULMONARY NODULE 11/25/2006  . Vitamin D deficiency    Past Surgical History:  Procedure Laterality Date  . ABDOMINAL HYSTERECTOMY    . COLONOSCOPY    . left knee    . PARATHYROIDECTOMY Right 11/20/2014   Procedure: RIGHT PARATHYROIDECTOMY;  Surgeon: Armandina Gemma, MD;  Location: [facility];  Service: General;  Laterality: Right;  . POLYPECTOMY    . right wrist    . THYROID SURGERY     early 70's  . TOTAL HIP ARTHROPLASTY     left 3/05, right 2002  . TOTAL HIP ARTHROPLASTY Left 2005   . UMBILICAL HERNIA REPAIR      reports that she quit smoking about 50 years ago. She quit after 1.00 year of use. She quit smokeless tobacco use about 44 years ago. She reports that she does not drink alcohol or use drugs. family history includes Breast cancer in her mother; Hypertension in an other family member; Stroke in her father. No Known Allergies Current Outpatient Medications on File Prior to Visit  Medication Sig Dispense Refill  . alendronate (FOSAMAX) 70 MG tablet TAKE 1 TABLET ONCE EVERY WEEK (ON SUNDAY MORNINGS) WITH A FULL GLASS OF WATER ON AN EMPTY STOMACH  12 tablet 1  . aspirin 81 MG EC tablet Take 81 mg by mouth daily.     . cholecalciferol (VITAMIN D) 1000 UNITS tablet Take 1,000 Units by mouth 2 (two) times daily.     Marland Kitchen levothyroxine (SYNTHROID) 75 MCG tablet TAKE 1 TABLET EVERY DAY 90 tablet 1  . lovastatin (MEVACOR) 20 MG tablet TAKE 1 TABLET EVERY DAY 90 tablet 1  . Naproxen Sodium (ALEVE) 220 MG CAPS Take 440 mg by mouth daily as needed (pain).     Marland Kitchen OVER THE COUNTER MEDICATION Place 1 drop into both eyes daily as needed (dry eyes). Over the counter lubricating eye drops    . psyllium (HYDROCIL/METAMUCIL) 95 % PACK Take 1 packet by mouth daily as needed (constipation).     . valsartan (  DIOVAN) 80 MG tablet TAKE 1 TABLET EVERY DAY 90 tablet 2   No current facility-administered medications on file prior to visit.    Review of Systems  Constitutional: Negative for other unusual diaphoresis or sweats HENT: Negative for ear discharge or swelling Eyes: Negative for other worsening visual disturbances Respiratory: Negative for stridor or other swelling  Gastrointestinal: Negative for worsening distension or other blood Genitourinary: Negative for retention or other urinary change Musculoskeletal: Negative for other MSK pain or swelling Skin: Negative for color change or other new lesions Neurological: Negative for worsening tremors and other numbness   Psychiatric/Behavioral: Negative for worsening agitation or other fatigue All otherwise neg per pt     Objective:   Physical Exam BP 136/84   Pulse (!) 103   Temp 98.3 F (36.8 C) (Oral)   Ht 4\' 11"  (1.499 m)   SpO2 95%   BMI 39.79 kg/m  VS noted,  Constitutional: Pt appears in NAD HENT: Head: NCAT.  Right Ear: External ear normal.  Left Ear: External ear normal.  Eyes: . Pupils are equal, round, and reactive to light. Conjunctivae and EOM are normal Nose: without d/c or deformity Neck: Neck supple. Gross normal ROM Cardiovascular: Normal rate and regular rhythm.   Pulmonary/Chest: Effort normal and breath sounds without rales or wheezing.  Abd:  Soft, NT, ND, + BS, no organomegaly Neurological: Pt is alert. At baseline orientation, motor grossly intact Skin: Skin is warm. No rashes, other new lesions, no LE edema Psychiatric: Pt behavior is normal without agitation  All otherwise neg per pt Lab Results  Component Value Date   WBC 4.6 07/22/2018   HGB 13.1 07/22/2018   HCT 39.4 07/22/2018   PLT 288.0 07/22/2018   GLUCOSE 91 07/22/2018   CHOL 192 07/22/2018   TRIG 80.0 07/22/2018   HDL 65.40 07/22/2018   LDLDIRECT 118.5 01/01/2012   LDLCALC 111 (H) 07/22/2018   ALT 8 07/22/2018   AST 14 07/22/2018   NA 139 07/22/2018   K 3.9 07/22/2018   CL 103 07/22/2018   CREATININE 0.83 07/22/2018   BUN 10 07/22/2018   CO2 29 07/22/2018   TSH 1.27 07/22/2018         Assessment & Plan:

## 2019-01-24 NOTE — Patient Instructions (Signed)
Please continue all other medications as before, and refills have been done if requested.  Please have the pharmacy call with any other refills you may need.  Please continue your efforts at being more active, low cholesterol diet, and weight control.  Please keep your appointments with your specialists as you may have planned  Please return in 6 months, or sooner if needed 

## 2019-01-24 NOTE — Assessment & Plan Note (Signed)
stable overall by history and exam, recent data reviewed with pt, and pt to continue medical treatment as before,  to f/u any worsening symptoms or concerns  

## 2019-02-14 ENCOUNTER — Telehealth: Payer: Self-pay

## 2019-02-14 NOTE — Telephone Encounter (Signed)
Copied from [place] [id number]. Topic: General - Inquiry >> Feb 07, 2019 10:19 AM Alease Frame wrote: Reason for CRM: Patient sent over paperwork to be filled out from Dr Jenny Reichmann regarding her riding a bus . Please advise    This information was mailed to the patient on 02-13-2019---ST

## 2019-03-01 ENCOUNTER — Other Ambulatory Visit: Payer: Self-pay | Admitting: Internal Medicine

## 2019-03-01 NOTE — Telephone Encounter (Signed)
Please refill as per office routine med refill policy (all routine meds refilled for 3 mo or monthly per pt preference up to one year from last visit, then month to month grace period for 3 mo, then further med refills will have to be denied)  

## 2019-03-22 ENCOUNTER — Other Ambulatory Visit: Payer: Self-pay | Admitting: Internal Medicine

## 2019-03-22 NOTE — Telephone Encounter (Signed)
Please refill as per office routine med refill policy (all routine meds refilled for 3 mo or monthly per pt preference up to one year from last visit, then month to month grace period for 3 mo, then further med refills will have to be denied)  

## 2019-03-24 ENCOUNTER — Other Ambulatory Visit: Payer: Self-pay | Admitting: Internal Medicine

## 2019-03-24 NOTE — Telephone Encounter (Signed)
Please refill as per office routine med refill policy (all routine meds refilled for 3 mo or monthly per pt preference up to one year from last visit, then month to month grace period for 3 mo, then further med refills will have to be denied)  

## 2019-03-27 NOTE — Telephone Encounter (Signed)
Forms have been completed and placed in providers box to sign.

## 2019-03-30 NOTE — Telephone Encounter (Signed)
Forms have been signed, Faxed to [employer] @[phone], Copy sent to scan.  Patient informed and original mailed.

## 2019-05-17 ENCOUNTER — Other Ambulatory Visit: Payer: Self-pay | Admitting: Internal Medicine

## 2019-07-22 ENCOUNTER — Other Ambulatory Visit: Payer: Self-pay | Admitting: Internal Medicine

## 2019-07-23 NOTE — Telephone Encounter (Signed)
Ok to contact pt -   If her total lifetime fosamax has been 5 yrs, we should stop; otherwise ok for refill per routine OV policy

## 2019-07-25 ENCOUNTER — Other Ambulatory Visit: Payer: Self-pay

## 2019-07-25 ENCOUNTER — Ambulatory Visit (INDEPENDENT_AMBULATORY_CARE_PROVIDER_SITE_OTHER): Payer: Medicare HMO | Admitting: Internal Medicine

## 2019-07-25 ENCOUNTER — Encounter: Payer: Self-pay | Admitting: Internal Medicine

## 2019-07-25 VITALS — BP 118/80 | HR 86 | Temp 98.0°F | Ht 59.0 in | Wt 194.0 lb

## 2019-07-25 DIAGNOSIS — E559 Vitamin D deficiency, unspecified: Secondary | ICD-10-CM

## 2019-07-25 DIAGNOSIS — Z0000 Encounter for general adult medical examination without abnormal findings: Secondary | ICD-10-CM

## 2019-07-25 DIAGNOSIS — R739 Hyperglycemia, unspecified: Secondary | ICD-10-CM

## 2019-07-25 DIAGNOSIS — E538 Deficiency of other specified B group vitamins: Secondary | ICD-10-CM

## 2019-07-25 LAB — CBC WITH DIFFERENTIAL/PLATELET
Basophils Absolute: 0.1 10*3/uL (ref 0.0–0.1)
Basophils Relative: 2.3 % (ref 0.0–3.0)
Eosinophils Absolute: 0.1 10*3/uL (ref 0.0–0.7)
Eosinophils Relative: 2.2 % (ref 0.0–5.0)
HCT: 37.6 % (ref 36.0–46.0)
Hemoglobin: 12.4 g/dL (ref 12.0–15.0)
Lymphocytes Relative: 15.8 % (ref 12.0–46.0)
Lymphs Abs: 0.6 10*3/uL — ABNORMAL LOW (ref 0.7–4.0)
MCHC: 33 g/dL (ref 30.0–36.0)
MCV: 88 fl (ref 78.0–100.0)
Monocytes Absolute: 0.4 10*3/uL (ref 0.1–1.0)
Monocytes Relative: 10.8 % (ref 3.0–12.0)
Neutro Abs: 2.8 10*3/uL (ref 1.4–7.7)
Neutrophils Relative %: 68.9 % (ref 43.0–77.0)
Platelets: 271 10*3/uL (ref 150.0–400.0)
RBC: 4.27 Mil/uL (ref 3.87–5.11)
RDW: 14.3 % (ref 11.5–15.5)
WBC: 4 10*3/uL (ref 4.0–10.5)

## 2019-07-25 LAB — BASIC METABOLIC PANEL
BUN: 13 mg/dL (ref 6–23)
CO2: 30 mEq/L (ref 19–32)
Calcium: 9.1 mg/dL (ref 8.4–10.5)
Chloride: 103 mEq/L (ref 96–112)
Creatinine, Ser: 0.7 mg/dL (ref 0.40–1.20)
GFR: 98.46 mL/min (ref >60.00)
Glucose, Bld: 94 mg/dL (ref 70–99)
Potassium: 4 mEq/L (ref 3.5–5.1)
Sodium: 137 mEq/L (ref 135–145)

## 2019-07-25 LAB — LIPID PANEL
Cholesterol: 174 mg/dL (ref 0–200)
HDL: 58.5 mg/dL (ref >39.00)
LDL Cholesterol: 105 mg/dL — ABNORMAL HIGH (ref 0–99)
NonHDL: 115.81
Total CHOL/HDL Ratio: 3
Triglycerides: 55 mg/dL (ref 0.0–149.0)
VLDL: 11 mg/dL (ref 0.0–40.0)

## 2019-07-25 LAB — URINALYSIS, ROUTINE W REFLEX MICROSCOPIC
Bilirubin Urine: NEGATIVE
Hgb urine dipstick: NEGATIVE
Ketones, ur: NEGATIVE
Leukocytes,Ua: NEGATIVE
Nitrite: NEGATIVE
RBC / HPF: NONE SEEN (ref 0–?)
Specific Gravity, Urine: 1.02 (ref 1.000–1.030)
Total Protein, Urine: NEGATIVE
Urine Glucose: NEGATIVE
Urobilinogen, UA: 1 (ref 0.0–1.0)
pH: 6 (ref 5.0–8.0)

## 2019-07-25 LAB — VITAMIN B12: Vitamin B-12: 317 pg/mL (ref 211–911)

## 2019-07-25 LAB — HEPATIC FUNCTION PANEL
ALT: 9 U/L (ref 0–35)
AST: 16 U/L (ref 0–37)
Albumin: 4 g/dL (ref 3.5–5.2)
Alkaline Phosphatase: 37 U/L — ABNORMAL LOW (ref 39–117)
Bilirubin, Direct: 0.1 mg/dL (ref 0.0–0.3)
Total Bilirubin: 0.4 mg/dL (ref 0.2–1.2)
Total Protein: 7.2 g/dL (ref 6.0–8.3)

## 2019-07-25 LAB — TSH: TSH: 0.93 u[IU]/mL (ref 0.35–4.50)

## 2019-07-25 LAB — HEMOGLOBIN A1C: Hgb A1c MFr Bld: 6.5 % (ref 4.6–6.5)

## 2019-07-25 LAB — VITAMIN D 25 HYDROXY (VIT D DEFICIENCY, FRACTURES): VITD: 44.75 ng/mL (ref 30.00–100.00)

## 2019-07-25 NOTE — Patient Instructions (Signed)

## 2019-07-25 NOTE — Assessment & Plan Note (Signed)

## 2019-07-25 NOTE — Progress Notes (Signed)
Subjective:    Patient ID: [name], female    DOB: [birth year], 74 y.o.   MRN: [id number]  HPI  Here for wellness and f/u;  Overall doing ok;  Pt denies Chest pain, worsening SOB, DOE, wheezing, orthopnea, PND, worsening LE edema, palpitations, dizziness or syncope.  Pt denies neurological change such as new headache, facial or extremity weakness.  Pt denies polydipsia, polyuria, or low sugar symptoms. Pt states overall good compliance with treatment and medications, good tolerability, and has been trying to follow appropriate diet.  Pt denies worsening depressive symptoms, suicidal ideation or panic. No fever, night sweats, wt loss, loss of appetite, or other constitutional symptoms.  Pt states good ability with ADL's, has low fall risk, home safety reviewed and adequate, no other significant changes in hearing or vision, and only occasionally active with exercise. No new complaints Past Medical History:  Diagnosis Date  . Allergic rhinitis, cause unspecified 12/26/2010  . Cataract    1 eye-? which  . DEGENERATIVE JOINT DISEASE 11/25/2006   in knees   . DEPRESSION 11/25/2006  . DIVERTICULOSIS, COLON 12/04/2006  . DIZZINESS 11/18/2009  . DJD (degenerative joint disease)    Bilateral Hip  . Headache    uses aleve for headache   . HYPERCHOLESTEROLEMIA 11/25/2006  . HYPERLIPIDEMIA 11/18/2009  . HYPERPARATHYROIDISM, HX OF 11/25/2006  . HYPERTENSION 07/15/2007  . HYPOTHYROIDISM 11/25/2006  . OBESITY 12/04/2006  . OSTEOPENIA 11/25/2006  . OTITIS MEDIA, ACUTE, LEFT 11/18/2009  . PALPITATIONS, RECURRENT 07/20/2008  . POSTMENOPAUSAL STATUS 11/25/2006  . PULMONARY NODULE 11/25/2006  . Vitamin D deficiency    Past Surgical History:  Procedure Laterality Date  . ABDOMINAL HYSTERECTOMY    . COLONOSCOPY    . left knee    . PARATHYROIDECTOMY Right 11/20/2014   Procedure: RIGHT PARATHYROIDECTOMY;  Surgeon: Armandina Gemma, MD;  Location: [place];  Service: General;  Laterality: Right;  . POLYPECTOMY    .  right wrist    . THYROID SURGERY     early 70's  . TOTAL HIP ARTHROPLASTY     left 3/05, right 2002  . TOTAL HIP ARTHROPLASTY Left 2005  . UMBILICAL HERNIA REPAIR      reports that she quit smoking about 51 years ago. She quit after 1.00 year of use. She quit smokeless tobacco use about 44 years ago. She reports that she does not drink alcohol or use drugs. family history includes Breast cancer in her mother; Hypertension in an other family member; Stroke in her father. No Known Allergies Current Outpatient Medications on File Prior to Visit  Medication Sig Dispense Refill  . alendronate (FOSAMAX) 70 MG tablet TAKE 1 TABLET ONCE EVERY WEEK (ON SUNDAY MORNINGS) WITH A FULL GLASS OF WATER ON AN EMPTY STOMACH  12 tablet 1  . aspirin 81 MG EC tablet Take 81 mg by mouth daily.     . cholecalciferol (VITAMIN D) 1000 UNITS tablet Take 1,000 Units by mouth 2 (two) times daily.     Marland Kitchen levothyroxine (SYNTHROID) 75 MCG tablet TAKE 1 TABLET EVERY DAY 90 tablet 1  . lovastatin (MEVACOR) 20 MG tablet Take 1 tablet (20 mg total) by mouth daily. Annual appt due in June must see provider for future refills 90 tablet 0  . Naproxen Sodium (ALEVE) 220 MG CAPS Take 440 mg by mouth daily as needed (pain).     Marland Kitchen OVER THE COUNTER MEDICATION Place 1 drop into both eyes daily as needed (dry eyes). Over the counter lubricating  eye drops    . psyllium (HYDROCIL/METAMUCIL) 95 % PACK Take 1 packet by mouth daily as needed (constipation).     . valsartan (DIOVAN) 80 MG tablet TAKE 1 TABLET EVERY DAY 90 tablet 2   No current facility-administered medications on file prior to visit.   Review of Systems All otherwise neg per pt    Objective:   Physical Exam BP 118/80 (BP Location: Left Arm, Patient Position: Sitting, Cuff Size: Large)   Pulse 86   Temp 98 F (36.7 C) (Oral)   Ht 4\' 11"  (1.499 m)   Wt 194 lb (88 kg)   SpO2 97%   BMI 39.18 kg/m  VS noted,  Constitutional: Pt appears in NAD HENT: Head: NCAT.   Right Ear: External ear normal.  Left Ear: External ear normal.  Eyes: . Pupils are equal, round, and reactive to light. Conjunctivae and EOM are normal Nose: without d/c or deformity Neck: Neck supple. Gross normal ROM Cardiovascular: Normal rate and regular rhythm.   Pulmonary/Chest: Effort normal and breath sounds without rales or wheezing.  Abd:  Soft, NT, ND, + BS, no organomegaly Neurological: Pt is alert. At baseline orientation, motor grossly intact Skin: Skin is warm. No rashes, other new lesions, no LE edema Psychiatric: Pt behavior is normal without agitation  All otherwise neg per pt Lab Results  Component Value Date   WBC 4.0 07/25/2019   HGB 12.4 07/25/2019   HCT 37.6 07/25/2019   PLT 271.0 07/25/2019   GLUCOSE 94 07/25/2019   CHOL 174 07/25/2019   TRIG 55.0 07/25/2019   HDL 58.50 07/25/2019   LDLDIRECT 118.5 01/01/2012   LDLCALC 105 (H) 07/25/2019   ALT 9 07/25/2019   AST 16 07/25/2019   NA 137 07/25/2019   K 4.0 07/25/2019   CL 103 07/25/2019   CREATININE 0.70 07/25/2019   BUN 13 07/25/2019   CO2 30 07/25/2019   TSH 0.93 07/25/2019   HGBA1C 6.5 07/25/2019      Assessment & Plan:

## 2019-07-25 NOTE — Assessment & Plan Note (Signed)
stable overall by history and exam, recent data reviewed with pt, and pt to continue medical treatment as before,  to f/u any worsening symptoms or concerns  

## 2019-08-15 ENCOUNTER — Other Ambulatory Visit: Payer: Self-pay | Admitting: Internal Medicine

## 2019-08-15 NOTE — Telephone Encounter (Signed)
Please refill as per office routine med refill policy (all routine meds refilled for 3 mo or monthly per pt preference up to one year from last visit, then month to month grace period for 3 mo, then further med refills will have to be denied)  

## 2019-09-05 ENCOUNTER — Ambulatory Visit: Payer: Medicare HMO

## 2019-10-04 ENCOUNTER — Other Ambulatory Visit: Payer: Self-pay | Admitting: Internal Medicine

## 2019-10-04 NOTE — Telephone Encounter (Signed)
Please refill as per office routine med refill policy (all routine meds refilled for 3 mo or monthly per pt preference up to one year from last visit, then month to month grace period for 3 mo, then further med refills will have to be denied)  

## 2019-10-10 ENCOUNTER — Other Ambulatory Visit: Payer: Self-pay | Admitting: Obstetrics and Gynecology

## 2019-10-10 DIAGNOSIS — Z1231 Encounter for screening mammogram for malignant neoplasm of breast: Secondary | ICD-10-CM

## 2019-10-20 DIAGNOSIS — M1712 Unilateral primary osteoarthritis, left knee: Secondary | ICD-10-CM | POA: Diagnosis not present

## 2019-10-26 DIAGNOSIS — M25512 Pain in left shoulder: Secondary | ICD-10-CM | POA: Diagnosis not present

## 2019-10-26 DIAGNOSIS — M546 Pain in thoracic spine: Secondary | ICD-10-CM | POA: Diagnosis not present

## 2019-10-26 DIAGNOSIS — M545 Low back pain: Secondary | ICD-10-CM | POA: Diagnosis not present

## 2019-11-02 DIAGNOSIS — H524 Presbyopia: Secondary | ICD-10-CM | POA: Diagnosis not present

## 2019-11-02 DIAGNOSIS — H43813 Vitreous degeneration, bilateral: Secondary | ICD-10-CM | POA: Diagnosis not present

## 2019-11-02 DIAGNOSIS — H2513 Age-related nuclear cataract, bilateral: Secondary | ICD-10-CM | POA: Diagnosis not present

## 2019-11-14 DIAGNOSIS — M546 Pain in thoracic spine: Secondary | ICD-10-CM | POA: Insufficient documentation

## 2019-11-20 ENCOUNTER — Ambulatory Visit
Admission: RE | Admit: 2019-11-20 | Discharge: 2019-11-20 | Disposition: A | Discharge status: Home / Self Care | Payer: Medicare HMO | Source: Ambulatory Visit | Attending: Obstetrics and Gynecology | Admitting: Obstetrics and Gynecology

## 2019-11-20 ENCOUNTER — Other Ambulatory Visit: Payer: Self-pay

## 2019-11-20 DIAGNOSIS — Z1231 Encounter for screening mammogram for malignant neoplasm of breast: Secondary | ICD-10-CM

## 2019-11-24 ENCOUNTER — Other Ambulatory Visit: Payer: Self-pay | Admitting: Internal Medicine

## 2019-11-24 NOTE — Telephone Encounter (Signed)
Please refill as per office routine med refill policy (all routine meds refilled for 3 mo or monthly per pt preference up to one year from last visit, then month to month grace period for 3 mo, then further med refills will have to be denied)  

## 2019-12-09 DIAGNOSIS — M25512 Pain in left shoulder: Secondary | ICD-10-CM | POA: Diagnosis not present

## 2019-12-09 DIAGNOSIS — M546 Pain in thoracic spine: Secondary | ICD-10-CM | POA: Diagnosis not present

## 2019-12-19 DIAGNOSIS — M25512 Pain in left shoulder: Secondary | ICD-10-CM | POA: Diagnosis not present

## 2020-01-18 DIAGNOSIS — M19012 Primary osteoarthritis, left shoulder: Secondary | ICD-10-CM | POA: Diagnosis not present

## 2020-02-08 ENCOUNTER — Other Ambulatory Visit: Payer: Self-pay | Admitting: Internal Medicine

## 2020-02-08 NOTE — Telephone Encounter (Signed)
Please refill as per office routine med refill policy (all routine meds refilled for 3 mo or monthly per pt preference up to one year from last visit, then month to month grace period for 3 mo, then further med refills will have to be denied)  

## 2020-02-22 ENCOUNTER — Ambulatory Visit (INDEPENDENT_AMBULATORY_CARE_PROVIDER_SITE_OTHER): Payer: Medicare HMO

## 2020-02-22 VITALS — Ht 59.0 in | Wt 188.4 lb

## 2020-02-22 DIAGNOSIS — Z0000 Encounter for general adult medical examination without abnormal findings: Secondary | ICD-10-CM | POA: Diagnosis not present

## 2020-02-22 NOTE — Progress Notes (Signed)
I connected with [facility] today by telephone and verified that I am speaking with the correct person using two identifiers. Location patient: home Location provider: work Persons participating in the virtual visit: Peightyn Detty and Susie Cassette, LPN.   I discussed the limitations, risks, security and privacy concerns of performing an evaluation and management service by telephone and the availability of in person appointments. I also discussed with the patient that there may be a patient responsible charge related to this service. The patient expressed understanding and verbally consented to this telephonic visit.    Interactive audio and video telecommunications were attempted between this provider and patient, however failed, due to patient having technical difficulties OR patient did not have access to video capability.  We continued and completed visit with audio only.  Some vital signs may be absent or patient reported.   Time Spent with patient on telephone encounter: 30 minutes  Subjective:   [name] is a 75 y.o. female who presents for Medicare Annual (Subsequent) preventive examination.  Review of Systems    No ROS. Medicare Wellness Visit. Additional risk factors are reflected in social history. Cardiac Risk Factors include: advanced age (>33men, >43 women);dyslipidemia;family history of premature cardiovascular disease;hypertension;obesity (BMI >30kg/m2)     Objective:    Today's Vitals   02/22/20 1118 02/22/20 1119  Weight:  188 lb 6.4 oz (85.5 kg)  Height: 4\' 11"  (1.499 m) 4\' 11"  (1.499 m)  PainSc:  0-No pain   Body mass index is 38.05 kg/m.  Advanced Directives 02/22/2020 08/30/2018 08/24/2017 08/14/2016 01/14/2015 11/12/2014  Does Patient Have a Medical Advance Directive? No No No No No No  Would patient like information on creating a medical advance directive? No - Patient declined Yes (ED - Information included in AVS) No - Patient declined Yes (ED -  Information included in AVS) Yes - Educational materials given Yes - Educational materials given    Current Medications (verified) Outpatient Encounter Medications as of 02/22/2020  Medication Sig  . alendronate (FOSAMAX) 70 MG tablet TAKE 1 TABLET ONCE EVERY WEEK (ON SUNDAY MORNINGS) WITH A FULL GLASS OF WATER ON AN EMPTY STOMACH   . aspirin 81 MG EC tablet Take 81 mg by mouth daily.   . cholecalciferol (VITAMIN D) 1000 UNITS tablet Take 1,000 Units by mouth 2 (two) times daily.   11/14/2014 levothyroxine (SYNTHROID) 75 MCG tablet TAKE 1 TABLET EVERY DAY  . lovastatin (MEVACOR) 20 MG tablet Take 1 tablet (20 mg total) by mouth daily.  . Naproxen Sodium (ALEVE) 220 MG CAPS Take 440 mg by mouth daily as needed (pain).   04/21/2020 OVER THE COUNTER MEDICATION Place 1 drop into both eyes daily as needed (dry eyes). Over the counter lubricating eye drops  . psyllium (HYDROCIL/METAMUCIL) 95 % PACK Take 1 packet by mouth daily as needed (constipation).   . valsartan (DIOVAN) 80 MG tablet TAKE 1 TABLET EVERY DAY   No facility-administered encounter medications on file as of 02/22/2020.    Allergies (verified) Patient has no known allergies.   History: Past Medical History:  Diagnosis Date  . Allergic rhinitis, cause unspecified 12/26/2010  . Cataract    1 eye-? which  . DEGENERATIVE JOINT DISEASE 11/25/2006   in knees   . DEPRESSION 11/25/2006  . DIVERTICULOSIS, COLON 12/04/2006  . DIZZINESS 11/18/2009  . DJD (degenerative joint disease)    Bilateral Hip  . Headache    uses aleve for headache   . HYPERCHOLESTEROLEMIA 11/25/2006  . HYPERLIPIDEMIA  11/18/2009  . HYPERPARATHYROIDISM, HX OF 11/25/2006  . HYPERTENSION 07/15/2007  . HYPOTHYROIDISM 11/25/2006  . OBESITY 12/04/2006  . OSTEOPENIA 11/25/2006  . OTITIS MEDIA, ACUTE, LEFT 11/18/2009  . PALPITATIONS, RECURRENT 07/20/2008  . POSTMENOPAUSAL STATUS 11/25/2006  . PULMONARY NODULE 11/25/2006  . Vitamin D deficiency    Past Surgical History:  Procedure  Laterality Date  . ABDOMINAL HYSTERECTOMY    . COLONOSCOPY    . left knee    . PARATHYROIDECTOMY Right 11/20/2014   Procedure: RIGHT PARATHYROIDECTOMY;  Surgeon: Darnell Level, MD;  Location: [facility] OR;  Service: General;  Laterality: Right;  . POLYPECTOMY    . right wrist    . THYROID SURGERY     early 70's  . TOTAL HIP ARTHROPLASTY     left 3/05, right 2002  . TOTAL HIP ARTHROPLASTY Left 2005  . UMBILICAL HERNIA REPAIR     Family History  Problem Relation Age of Onset  . Breast cancer Mother   . Stroke Father   . Hypertension Other   . Colon cancer Neg Hx   . Esophageal cancer Neg Hx   . Rectal cancer Neg Hx   . Stomach cancer Neg Hx    Social History   Socioeconomic History  . Marital status: Legally Separated    Spouse name: Not on file  . Number of children: 2  . Years of education: Not on file  . Highest education level: Not on file  Occupational History  . Occupation: retired  Tobacco Use  . Smoking status: Former Smoker    Years: 1.00    Quit date: 02/17/1968    Years since quitting: 52.0  . Smokeless tobacco: Former Neurosurgeon    Quit date: 11/12/1974  . Tobacco comment: tried snuff as a child, casual smoker as young adult  Advertising account planner  . Vaping Use: Never used  Substance and Sexual Activity  . Alcohol use: No    Alcohol/week: 0.0 standard drinks  . Drug use: No  . Sexual activity: Never  Other Topics Concern  . Not on file  Social History Narrative  . Not on file   Social Determinants of Health   Financial Resource Strain: Low Risk   . Difficulty of Paying Living Expenses: Not hard at all  Food Insecurity: No Food Insecurity  . Worried About Programme researcher, broadcasting/film/video in the Last Year: Never true  . Ran Out of Food in the Last Year: Never true  Transportation Needs: No Transportation Needs  . Lack of Transportation (Medical): No  . Lack of Transportation (Non-Medical): No  Physical Activity: Inactive  . Days of Exercise per Week: 0 days  . Minutes of Exercise per  Session: 0 min  Stress: No Stress Concern Present  . Feeling of Stress : Not at all  Social Connections: Moderately Integrated  . Frequency of Communication with Friends and Family: More than three times a week  . Frequency of Social Gatherings with Friends and Family: More than three times a week  . Attends Religious Services: More than 4 times per year  . Active Member of Clubs or Organizations: No  . Attends Banker Meetings: More than 4 times per year  . Marital Status: Widowed    Tobacco Counseling Counseling given: Not Answered Comment: tried snuff as a child, casual smoker as young adult   Clinical Intake:  Pre-visit preparation completed: Yes  Pain : No/denies pain Pain Score: 0-No pain     Nutritional Risks: None Diabetes: No  How often do you need to have someone help you when you read instructions, pamphlets, or other written materials from your doctor or pharmacy?: 1 - Never What is the last grade level you completed in school?: [employer]  Diabetic? no  Interpreter Needed?: No  Information entered by :: Susie Cassette, LPN   Activities of Daily Living In your present state of health, do you have any difficulty performing the following activities: 02/22/2020  Hearing? N  Vision? N  Difficulty concentrating or making decisions? N  Walking or climbing stairs? Y  Comment due to knee pain  Dressing or bathing? N  Doing errands, shopping? N  Preparing Food and eating ? N  Using the Toilet? N  In the past six months, have you accidently leaked urine? Y  Do you have problems with loss of bowel control? N  Managing your Medications? N  Managing your Finances? N  Housekeeping or managing your Housekeeping? N  Some recent data might be hidden    Patient Care Team: Corwin Levins, MD as PCP - General Nyoka Cowden, MD as Consulting Physician (Pulmonary Disease) Ollen Gross, MD as Consulting Physician (Orthopedic  Surgery)  Indicate any recent Medical Services you may have received from other than [facility] providers in the past year (date may be approximate).     Assessment:   This is a routine wellness examination for [facility].  Hearing/Vision screen No exam data present  Dietary issues and exercise activities discussed: Current Exercise Habits: The patient does not participate in regular exercise at present, Exercise limited by: orthopedic condition(s)  Goals    . Exercise 150 minutes per week (moderate activity)     Will continue to go to the church 2 days a week and exercise when it reopens. I want to join [employer] again to lose weight and become as healthy as possible.      Depression Screen PHQ 2/9 Scores 02/22/2020 07/25/2019 07/25/2019 08/30/2018 07/22/2018 08/24/2017 07/20/2017  PHQ - 2 Score 0 0 0 1 0 1 0  PHQ- 9 Score - - - 1 - 3 -    Fall Risk Fall Risk  02/22/2020 07/25/2019 07/25/2019 08/30/2018 07/22/2018  Falls in the past year? 0 0 0 0 0  Number falls in past yr: 0 - 0 0 0  Injury with Fall? 0 - 0 - 0  Comment - - - - -  Risk for fall due to : No Fall Risks - Impaired balance/gait - -  Risk for fall due to: Comment - - - - -  Follow up Falls evaluation completed - Falls evaluation completed Falls prevention discussed -    FALL RISK PREVENTION PERTAINING TO THE HOME:  Any stairs in or around the home? No  If so, are there any without handrails? No  Home free of loose throw rugs in walkways, pet beds, electrical cords, etc? Yes  Adequate lighting in your home to reduce risk of falls? Yes   ASSISTIVE DEVICES UTILIZED TO PREVENT FALLS:  Life alert? No  Use of a cane, walker or w/c? Yes  Grab bars in the bathroom? Yes  Shower chair or bench in shower? Yes  Elevated toilet seat or a handicapped toilet? Yes   TIMED UP AND GO:  Was the test performed? No .  Length of time to ambulate 10 feet: 0 sec.   Gait steady and fast with assistive device  Cognitive Function: MMSE - Mini  Mental State Exam 08/24/2017 08/14/2016 01/14/2015  Orientation to time 5 5 5   Orientation to Place 5 5 5   Registration 3 3 3   Attention/ Calculation 5 4 2   Recall 2 2 2   Language- name 2 objects 2 2 2   Language- repeat 1 1 1   Language- follow 3 step command 3 3 3   Language- read & follow direction 1 1 1   Write a sentence 1 1 1   Copy design 1 1 1   Total score 29 28 26         Immunizations Immunization History  Administered Date(s) Administered  . Moderna Sars-Covid-2 Vaccination 03/31/2019, 04/28/2019, 01/23/2020  . Td 07/21/1995, 11/18/2009    TDAP status: Due, Education has been provided regarding the importance of this vaccine. Advised may receive this vaccine at local pharmacy or Health Dept. Aware to provide a copy of the vaccination record if obtained from local pharmacy or Health Dept. Verbalized acceptance and understanding.  Flu Vaccine status: Declined, Education has been provided regarding the importance of this vaccine but patient still declined. Advised may receive this vaccine at local pharmacy or Health Dept. Aware to provide a copy of the vaccination record if obtained from local pharmacy or Health Dept. Verbalized acceptance and understanding.  Pneumococcal vaccine status: Declined,  Education has been provided regarding the importance of this vaccine but patient still declined. Advised may receive this vaccine at local pharmacy or Health Dept. Aware to provide a copy of the vaccination record if obtained from local pharmacy or Health Dept. Verbalized acceptance and understanding.   Covid-19 vaccine status: Completed vaccines  Qualifies for Shingles Vaccine? Yes   Zostavax completed No   Shingrix Completed?: No.    Education has been provided regarding the importance of this vaccine. Patient has been advised to call insurance company to determine out of pocket expense if they have not yet received this vaccine. Advised may also receive vaccine at local pharmacy or  Health Dept. Verbalized acceptance and understanding.  Screening Tests Health Maintenance  Topic Date Due  . INFLUENZA VACCINE  Never done  . TETANUS/TDAP  11/19/2019  . PNA vac Low Risk Adult (1 of 2 - PCV13) 07/24/2020 (Originally 08/20/2008)  . DEXA SCAN  Completed  . COVID-19 Vaccine  Completed  . Hepatitis C Screening  Completed    Health Maintenance  Health Maintenance Due  Topic Date Due  . INFLUENZA VACCINE  Never done  . TETANUS/TDAP  11/19/2019    Colorectal cancer screening: No longer required.   Mammogram status: Completed 11/20/2019. Repeat every year  Bone Density status: Completed 07/21/2016. Results reflect: Bone density results: OSTEOPOROSIS. Repeat every 2 years.  Lung Cancer Screening: (Low Dose CT Chest recommended if Age 23-80 years, 30 pack-year currently smoking OR have quit w/in 15years.) does not qualify.   Lung Cancer Screening Referral: no  Additional Screening:  Hepatitis C Screening: does qualify; Completed yes  Vision Screening: Recommended annual ophthalmology exams for early detection of glaucoma and other disorders of the eye. Is the patient up to date with their annual eye exam?  Yes  Who is the provider or what is the name of the office in which the patient attends annual eye exams? Dr. at [facility] If pt is not established with a provider, would they like to be referred to a provider to establish care? No .   Dental Screening: Recommended annual dental exams for proper oral hygiene  Community Resource Referral / Chronic Care Management: CRR required this visit?  No   CCM  required this visit?  No      Plan:     I have personally reviewed and noted the following in the patient's chart:   . Medical and social history . Use of alcohol, tobacco or illicit drugs  . Current medications and supplements . Functional ability and status . Nutritional status . Physical activity . Advanced directives . List  of other physicians . Hospitalizations, surgeries, and ER visits in previous 12 months . Vitals . Screenings to include cognitive, depression, and falls . Referrals and appointments  In addition, I have reviewed and discussed with patient certain preventive protocols, quality metrics, and best practice recommendations. A written personalized care plan for preventive services as well as general preventive health recommendations were provided to patient.     Sheral Flow, LPN   02/21/1094   Nurse Notes:  Patient provided her weight and height. Patient is cogitatively intact. There were no vitals filed for this visit.

## 2020-02-22 NOTE — Patient Instructions (Signed)
[name] , Thank you for taking time to come for your Medicare Wellness Visit. I appreciate your ongoing commitment to your health goals. Please review the following plan we discussed and let me know if I can assist you in the future.   Screening recommendations/referrals: Colonoscopy: no repeat due to age Mammogram: 11/20/2019 Bone Density: 07/21/2016 Recommended yearly ophthalmology/optometry visit for glaucoma screening and checkup Recommended yearly dental visit for hygiene and checkup  Vaccinations: Influenza vaccine: declined Pneumococcal vaccine: declined Tdap vaccine: declined Shingles vaccine: declined   Covid-19: up to date  Advanced directives: Advance directive discussed with you today. Even though you declined this today please call our office should you change your mind and we can give you the proper paperwork for you to fill out.  Conditions/risks identified: Yes. Reviewed health maintenance screenings with patient today and relevant education, vaccines, and/or referrals were provided. Continue doing brain stimulating activities (puzzles, reading, adult coloring books, staying active) to keep memory sharp. Continue to eat heart healthy diet (full of fruits, vegetables, whole grains, lean protein, water--limit salt, fat, and sugar intake) and increase physical activity as tolerated.  Next appointment: Please schedule your next Medicare Wellness Visit with your Nurse Health Advisor in 1 year by calling [phone].   Preventive Care 49 Years and Older, Female Preventive care refers to lifestyle choices and visits with your health care provider that can promote health and wellness. What does preventive care include?  A yearly physical exam. This is also called an annual well check.  Dental exams once or twice a year.  Routine eye exams. Ask your health care provider how often you should have your eyes checked.  Personal lifestyle choices, including:  Daily care of your  teeth and gums.  Regular physical activity.  Eating a healthy diet.  Avoiding tobacco and drug use.  Limiting alcohol use.  Practicing safe sex.  Taking low-dose aspirin every day.  Taking vitamin and mineral supplements as recommended by your health care provider. What happens during an annual well check? The services and screenings done by your health care provider during your annual well check will depend on your age, overall health, lifestyle risk factors, and family history of disease. Counseling  Your health care provider may ask you questions about your:  Alcohol use.  Tobacco use.  Drug use.  Emotional well-being.  Home and relationship well-being.  Sexual activity.  Eating habits.  History of falls.  Memory and ability to understand (cognition).  Work and work Astronomer.  Reproductive health. Screening  You may have the following tests or measurements:  Height, weight, and BMI.  Blood pressure.  Lipid and cholesterol levels. These may be checked every 5 years, or more frequently if you are over 60 years old.  Skin check.  Lung cancer screening. You may have this screening every year starting at age 28 if you have a 30-pack-year history of smoking and currently smoke or have quit within the past 15 years.  Fecal occult blood test (FOBT) of the stool. You may have this test every year starting at age 49.  Flexible sigmoidoscopy or colonoscopy. You may have a sigmoidoscopy every 5 years or a colonoscopy every 10 years starting at age 67.  Hepatitis C blood test.  Hepatitis B blood test.  Sexually transmitted disease (STD) testing.  Diabetes screening. This is done by checking your blood sugar (glucose) after you have not eaten for a while (fasting). You may have this done every 1-3 years.  Bone density  scan. This is done to screen for osteoporosis. You may have this done starting at age 6.  Mammogram. This may be done every 1-2 years. Talk  to your health care provider about how often you should have regular mammograms. Talk with your health care provider about your test results, treatment options, and if necessary, the need for more tests. Vaccines  Your health care provider may recommend certain vaccines, such as:  Influenza vaccine. This is recommended every year.  Tetanus, diphtheria, and acellular pertussis (Tdap, Td) vaccine. You may need a Td booster every 10 years.  Zoster vaccine. You may need this after age 61.  Pneumococcal 13-valent conjugate (PCV13) vaccine. One dose is recommended after age 4.  Pneumococcal polysaccharide (PPSV23) vaccine. One dose is recommended after age 64. Talk to your health care provider about which screenings and vaccines you need and how often you need them. This information is not intended to replace advice given to you by your health care provider. Make sure you discuss any questions you have with your health care provider. Document Released: 03/01/2015 Document Revised: 10/23/2015 Document Reviewed: 12/04/2014 Elsevier Interactive Patient Education  2017 [place] Prevention in the Home Falls can cause injuries. They can happen to people of all ages. There are many things you can do to make your home safe and to help prevent falls. What can I do on the outside of my home?  Regularly fix the edges of walkways and driveways and fix any cracks.  Remove anything that might make you trip as you walk through a door, such as a raised step or threshold.  Trim any bushes or trees on the path to your home.  Use bright outdoor lighting.  Clear any walking paths of anything that might make someone trip, such as rocks or tools.  Regularly check to see if handrails are loose or broken. Make sure that both sides of any steps have handrails.  Any raised decks and porches should have guardrails on the edges.  Have any leaves, snow, or ice cleared regularly.  Use sand or salt on  walking paths during winter.  Clean up any spills in your garage right away. This includes oil or grease spills. What can I do in the bathroom?  Use night lights.  Install grab bars by the toilet and in the tub and shower. Do not use towel bars as grab bars.  Use non-skid mats or decals in the tub or shower.  If you need to sit down in the shower, use a plastic, non-slip stool.  Keep the floor dry. Clean up any water that spills on the floor as soon as it happens.  Remove soap buildup in the tub or shower regularly.  Attach bath mats securely with double-sided non-slip rug tape.  Do not have throw rugs and other things on the floor that can make you trip. What can I do in the bedroom?  Use night lights.  Make sure that you have a light by your bed that is easy to reach.  Do not use any sheets or blankets that are too big for your bed. They should not hang down onto the floor.  Have a firm chair that has side arms. You can use this for support while you get dressed.  Do not have throw rugs and other things on the floor that can make you trip. What can I do in the kitchen?  Clean up any spills right away.  Avoid walking on wet  floors.  Keep items that you use a lot in easy-to-reach places.  If you need to reach something above you, use a strong step stool that has a grab bar.  Keep electrical cords out of the way.  Do not use floor polish or wax that makes floors slippery. If you must use wax, use non-skid floor wax.  Do not have throw rugs and other things on the floor that can make you trip. What can I do with my stairs?  Do not leave any items on the stairs.  Make sure that there are handrails on both sides of the stairs and use them. Fix handrails that are broken or loose. Make sure that handrails are as long as the stairways.  Check any carpeting to make sure that it is firmly attached to the stairs. Fix any carpet that is loose or worn.  Avoid having throw  rugs at the top or bottom of the stairs. If you do have throw rugs, attach them to the floor with carpet tape.  Make sure that you have a light switch at the top of the stairs and the bottom of the stairs. If you do not have them, ask someone to add them for you. What else can I do to help prevent falls?  Wear shoes that:  Do not have high heels.  Have rubber bottoms.  Are comfortable and fit you well.  Are closed at the toe. Do not wear sandals.  If you use a stepladder:  Make sure that it is fully opened. Do not climb a closed stepladder.  Make sure that both sides of the stepladder are locked into place.  Ask someone to hold it for you, if possible.  Clearly mark and make sure that you can see:  Any grab bars or handrails.  First and last steps.  Where the edge of each step is.  Use tools that help you move around (mobility aids) if they are needed. These include:  Canes.  Walkers.  Scooters.  Crutches.  Turn on the lights when you go into a dark area. Replace any light bulbs as soon as they burn out.  Set up your furniture so you have a clear path. Avoid moving your furniture around.  If any of your floors are uneven, fix them.  If there are any pets around you, be aware of where they are.  Review your medicines with your doctor. Some medicines can make you feel dizzy. This can increase your chance of falling. Ask your doctor what other things that you can do to help prevent falls. This information is not intended to replace advice given to you by your health care provider. Make sure you discuss any questions you have with your health care provider. Document Released: 11/29/2008 Document Revised: 07/11/2015 Document Reviewed: 03/09/2014 Elsevier Interactive Patient Education  2017 [employer].

## 2020-02-29 ENCOUNTER — Ambulatory Visit: Payer: Medicare HMO | Admitting: Internal Medicine

## 2020-04-02 ENCOUNTER — Other Ambulatory Visit: Payer: Self-pay

## 2020-04-02 ENCOUNTER — Ambulatory Visit (INDEPENDENT_AMBULATORY_CARE_PROVIDER_SITE_OTHER): Payer: Medicare HMO | Admitting: Internal Medicine

## 2020-04-02 ENCOUNTER — Encounter: Payer: Self-pay | Admitting: Internal Medicine

## 2020-04-02 VITALS — BP 126/78 | HR 87 | Temp 98.2°F | Ht 59.0 in | Wt 189.0 lb

## 2020-04-02 DIAGNOSIS — E538 Deficiency of other specified B group vitamins: Secondary | ICD-10-CM

## 2020-04-02 DIAGNOSIS — Z0000 Encounter for general adult medical examination without abnormal findings: Secondary | ICD-10-CM

## 2020-04-02 DIAGNOSIS — E785 Hyperlipidemia, unspecified: Secondary | ICD-10-CM | POA: Diagnosis not present

## 2020-04-02 DIAGNOSIS — R739 Hyperglycemia, unspecified: Secondary | ICD-10-CM | POA: Diagnosis not present

## 2020-04-02 DIAGNOSIS — E559 Vitamin D deficiency, unspecified: Secondary | ICD-10-CM | POA: Diagnosis not present

## 2020-04-02 NOTE — Patient Instructions (Signed)

## 2020-04-02 NOTE — Progress Notes (Signed)
Patient ID: [name], female   DOB: [birth year], 75 y.o.   MRN: [id number]         Chief Complaint:: wellness exam        HPI:  [name] is a 75 y.o. female here for wellness exam, currently up to date with preventive referrals, and declines pneuovax and tdap for now.  Husband died several months ago, but Denies worsening depressive symptoms, suicidal ideation, No new complaints   Wt Readings from Last 3 Encounters:  04/02/20 189 lb (85.7 kg)  02/22/20 188 lb 6.4 oz (85.5 kg)  07/25/19 194 lb (88 kg)   BP Readings from Last 3 Encounters:  04/02/20 126/78  07/25/19 118/80  01/24/19 136/84   Immunization History  Administered Date(s) Administered  . Moderna Sars-Covid-2 Vaccination 03/22/2019, 03/31/2019, 04/12/2019, 04/28/2019, 01/23/2020  . Td 07/21/1995, 11/18/2009   There are no preventive care reminders to display for this patient.     Also due soon for left shoulder surgury, but likely left kne TKR first with Dr Lequita Halt.    Past Medical History:  Diagnosis Date  . Allergic rhinitis, cause unspecified 12/26/2010  . Cataract    1 eye-? which  . DEGENERATIVE JOINT DISEASE 11/25/2006   in knees   . DEPRESSION 11/25/2006  . DIVERTICULOSIS, COLON 12/04/2006  . DIZZINESS 11/18/2009  . DJD (degenerative joint disease)    Bilateral Hip  . Headache    uses aleve for headache   . HYPERCHOLESTEROLEMIA 11/25/2006  . HYPERLIPIDEMIA 11/18/2009  . HYPERPARATHYROIDISM, HX OF 11/25/2006  . HYPERTENSION 07/15/2007  . HYPOTHYROIDISM 11/25/2006  . OBESITY 12/04/2006  . OSTEOPENIA 11/25/2006  . OTITIS MEDIA, ACUTE, LEFT 11/18/2009  . PALPITATIONS, RECURRENT 07/20/2008  . POSTMENOPAUSAL STATUS 11/25/2006  . PULMONARY NODULE 11/25/2006  . Vitamin D deficiency    Past Surgical History:  Procedure Laterality Date  . ABDOMINAL HYSTERECTOMY    . COLONOSCOPY    . left knee    . PARATHYROIDECTOMY Right 11/20/2014   Procedure: RIGHT PARATHYROIDECTOMY;  Surgeon: Darnell Level, MD;  Location:  [facility] OR;  Service: General;  Laterality: Right;  . POLYPECTOMY    . right wrist    . THYROID SURGERY     early 70's  . TOTAL HIP ARTHROPLASTY     left 3/05, right 2002  . TOTAL HIP ARTHROPLASTY Left 2005  . UMBILICAL HERNIA REPAIR      reports that she quit smoking about 52 years ago. She quit after 1.00 year of use. She quit smokeless tobacco use about 45 years ago. She reports that she does not drink alcohol and does not use drugs. family history includes Breast cancer in her mother; Hypertension in an other family member; Stroke in her father. No Known Allergies Current Outpatient Medications on File Prior to Visit  Medication Sig Dispense Refill  . alendronate (FOSAMAX) 70 MG tablet TAKE 1 TABLET ONCE EVERY WEEK (ON SUNDAY MORNINGS) WITH A FULL GLASS OF WATER ON AN EMPTY STOMACH  12 tablet 1  . aspirin 81 MG EC tablet Take 81 mg by mouth daily.    . cholecalciferol (VITAMIN D) 1000 UNITS tablet Take 1,000 Units by mouth 2 (two) times daily.     Marland Kitchen levothyroxine (SYNTHROID) 75 MCG tablet TAKE 1 TABLET EVERY DAY 90 tablet 1  . lovastatin (MEVACOR) 20 MG tablet Take 1 tablet (20 mg total) by mouth daily. 90 tablet 3  . Naproxen Sodium 220 MG CAPS Take 440 mg by mouth daily as needed (pain).     Marland Kitchen  OVER THE COUNTER MEDICATION Place 1 drop into both eyes daily as needed (dry eyes). Over the counter lubricating eye drops    . psyllium (HYDROCIL/METAMUCIL) 95 % PACK Take 1 packet by mouth daily as needed (constipation).     . valsartan (DIOVAN) 80 MG tablet TAKE 1 TABLET EVERY DAY 90 tablet 2   No current facility-administered medications on file prior to visit.        ROS:  All others reviewed and negative.  Objective        PE:  BP 126/78   Pulse 87   Temp 98.2 F (36.8 C) (Oral)   Ht 4\' 11"  (1.499 m)   Wt 189 lb (85.7 kg)   SpO2 97%   BMI 38.17 kg/m                 Constitutional: Pt appears in NAD               HENT: Head: NCAT.                Right Ear: External ear normal.                  Left Ear: External ear normal.                Eyes: . Pupils are equal, round, and reactive to light. Conjunctivae and EOM are normal               Nose: without d/c or deformity               Neck: Neck supple. Gross normal ROM               Cardiovascular: Normal rate and regular rhythm.                 Pulmonary/Chest: Effort normal and breath sounds without rales or wheezing.                Abd:  Soft, NT, ND, + BS, no organomegaly               Neurological: Pt is alert. At baseline orientation, motor grossly intact               Skin: Skin is warm. No rashes, no other new lesions, LE edema - none               Psychiatric: Pt behavior is normal without agitation   Micro: none  Cardiac tracings I have personally interpreted today:  none  Pertinent Radiological findings (summarize): none   Lab Results  Component Value Date   WBC 4.0 07/25/2019   HGB 12.4 07/25/2019   HCT 37.6 07/25/2019   PLT 271.0 07/25/2019   GLUCOSE 94 07/25/2019   CHOL 174 07/25/2019   TRIG 55.0 07/25/2019   HDL 58.50 07/25/2019   LDLDIRECT 118.5 01/01/2012   LDLCALC 105 (H) 07/25/2019   ALT 9 07/25/2019   AST 16 07/25/2019   NA 137 07/25/2019   K 4.0 07/25/2019   CL 103 07/25/2019   CREATININE 0.70 07/25/2019   BUN 13 07/25/2019   CO2 30 07/25/2019   TSH 0.93 07/25/2019   HGBA1C 6.5 07/25/2019   Assessment/Plan:  [name] is a 75 y.o. Black or African American [2] female with  has a past medical history of Allergic rhinitis, cause unspecified (12/26/2010), Cataract, DEGENERATIVE JOINT DISEASE (11/25/2006), DEPRESSION (11/25/2006), DIVERTICULOSIS, COLON (12/04/2006), DIZZINESS (11/18/2009), DJD (degenerative joint disease), Headache, HYPERCHOLESTEROLEMIA (11/25/2006),  HYPERLIPIDEMIA (11/18/2009), HYPERPARATHYROIDISM, HX OF (11/25/2006), HYPERTENSION (07/15/2007), HYPOTHYROIDISM (11/25/2006), OBESITY (12/04/2006), OSTEOPENIA (11/25/2006), OTITIS MEDIA, ACUTE, LEFT (11/18/2009),  PALPITATIONS, RECURRENT (07/20/2008), POSTMENOPAUSAL STATUS (11/25/2006), PULMONARY NODULE (11/25/2006), and Vitamin D deficiency.  Routine general medical examination at a health care facility Age and sex appropriate education and counseling updated with regular exercise and diet Referrals for preventative services - none needed Immunizations addressed - declines pneumovax and tdap for now Smoking counseling  - none needed Evidence for depression or other mood disorder - none significant Most recent labs reviewed. I have personally reviewed and have noted: 1) the patient's medical and social history 2) The patient's current medications and supplements 3) The patient's height, weight, and BMI have been recorded in the chart   Hyperglycemia Lab Results  Component Value Date   HGBA1C 6.5 07/25/2019   Stable, pt to continue current medical treatment  - diet   Hyperlipidemia Lab Results  Component Value Date   LDLCALC 105 (H) 07/25/2019   Stable, pt to continue current statin lovsatatin 20  Followup: Return in about 6 months (around 09/30/2020).  Oliver Barre, MD 04/07/2020 10:02 PM [facility]

## 2020-04-07 ENCOUNTER — Encounter: Payer: Self-pay | Admitting: Internal Medicine

## 2020-04-07 NOTE — Assessment & Plan Note (Signed)
Age and sex appropriate education and counseling updated with regular exercise and diet Referrals for preventative services - none needed Immunizations addressed - declines pneumovax and tdap for now Smoking counseling  - none needed Evidence for depression or other mood disorder - none significant Most recent labs reviewed. I have personally reviewed and have noted: 1) the patient's medical and social history 2) The patient's current medications and supplements 3) The patient's height, weight, and BMI have been recorded in the chart

## 2020-04-07 NOTE — Assessment & Plan Note (Signed)
Lab Results  Component Value Date   LDLCALC 105 (H) 07/25/2019   Stable, pt to continue current statin lovsatatin 20

## 2020-04-07 NOTE — Assessment & Plan Note (Signed)
Lab Results  Component Value Date   HGBA1C 6.5 07/25/2019   Stable, pt to continue current medical treatment  - diet

## 2020-04-08 ENCOUNTER — Encounter: Payer: Self-pay | Admitting: Internal Medicine

## 2020-04-08 ENCOUNTER — Other Ambulatory Visit (INDEPENDENT_AMBULATORY_CARE_PROVIDER_SITE_OTHER): Payer: Medicare HMO

## 2020-04-08 DIAGNOSIS — E559 Vitamin D deficiency, unspecified: Secondary | ICD-10-CM

## 2020-04-08 DIAGNOSIS — R739 Hyperglycemia, unspecified: Secondary | ICD-10-CM

## 2020-04-08 DIAGNOSIS — E785 Hyperlipidemia, unspecified: Secondary | ICD-10-CM

## 2020-04-08 DIAGNOSIS — E538 Deficiency of other specified B group vitamins: Secondary | ICD-10-CM | POA: Diagnosis not present

## 2020-04-08 LAB — URINALYSIS, ROUTINE W REFLEX MICROSCOPIC
Bilirubin Urine: NEGATIVE
Hgb urine dipstick: NEGATIVE
Ketones, ur: NEGATIVE
Leukocytes,Ua: NEGATIVE
Nitrite: NEGATIVE
RBC / HPF: NONE SEEN (ref 0–?)
Specific Gravity, Urine: 1.025 (ref 1.000–1.030)
Total Protein, Urine: NEGATIVE
Urine Glucose: NEGATIVE
Urobilinogen, UA: 0.2 (ref 0.0–1.0)
pH: 6 (ref 5.0–8.0)

## 2020-04-08 LAB — CBC WITH DIFFERENTIAL/PLATELET
Basophils Absolute: 0.1 10*3/uL (ref 0.0–0.1)
Basophils Relative: 1.7 % (ref 0.0–3.0)
Eosinophils Absolute: 0.2 10*3/uL (ref 0.0–0.7)
Eosinophils Relative: 4.4 % (ref 0.0–5.0)
HCT: 39.4 % (ref 36.0–46.0)
Hemoglobin: 13.1 g/dL (ref 12.0–15.0)
Lymphocytes Relative: 18.7 % (ref 12.0–46.0)
Lymphs Abs: 0.7 10*3/uL (ref 0.7–4.0)
MCHC: 33.2 g/dL (ref 30.0–36.0)
MCV: 87.5 fl (ref 78.0–100.0)
Monocytes Absolute: 0.4 10*3/uL (ref 0.1–1.0)
Monocytes Relative: 11.9 % (ref 3.0–12.0)
Neutro Abs: 2.3 10*3/uL (ref 1.4–7.7)
Neutrophils Relative %: 63.3 % (ref 43.0–77.0)
Platelets: 255 10*3/uL (ref 150.0–400.0)
RBC: 4.5 Mil/uL (ref 3.87–5.11)
RDW: 13.8 % (ref 11.5–15.5)
WBC: 3.6 10*3/uL — ABNORMAL LOW (ref 4.0–10.5)

## 2020-04-08 LAB — BASIC METABOLIC PANEL
BUN: 12 mg/dL (ref 6–23)
CO2: 29 mEq/L (ref 19–32)
Calcium: 9.2 mg/dL (ref 8.4–10.5)
Chloride: 103 mEq/L (ref 96–112)
Creatinine, Ser: 0.71 mg/dL (ref 0.40–1.20)
GFR: 82.47 mL/min (ref >60.00)
Glucose, Bld: 77 mg/dL (ref 70–99)
Potassium: 4.4 mEq/L (ref 3.5–5.1)
Sodium: 141 mEq/L (ref 135–145)

## 2020-04-08 LAB — LIPID PANEL
Cholesterol: 180 mg/dL (ref 0–200)
HDL: 62.5 mg/dL (ref >39.00)
LDL Cholesterol: 105 mg/dL — ABNORMAL HIGH (ref 0–99)
NonHDL: 117.57
Total CHOL/HDL Ratio: 3
Triglycerides: 62 mg/dL (ref 0.0–149.0)
VLDL: 12.4 mg/dL (ref 0.0–40.0)

## 2020-04-08 LAB — TSH: TSH: 1.37 u[IU]/mL (ref 0.35–4.50)

## 2020-04-08 LAB — HEPATIC FUNCTION PANEL
ALT: 7 U/L (ref 0–35)
AST: 15 U/L (ref 0–37)
Albumin: 4.1 g/dL (ref 3.5–5.2)
Alkaline Phosphatase: 40 U/L (ref 39–117)
Bilirubin, Direct: 0.1 mg/dL (ref 0.0–0.3)
Total Bilirubin: 0.5 mg/dL (ref 0.2–1.2)
Total Protein: 7.3 g/dL (ref 6.0–8.3)

## 2020-04-08 LAB — VITAMIN B12: Vitamin B-12: 250 pg/mL (ref 211–911)

## 2020-04-08 LAB — HEMOGLOBIN A1C: Hgb A1c MFr Bld: 6 % (ref 4.6–6.5)

## 2020-04-08 LAB — VITAMIN D 25 HYDROXY (VIT D DEFICIENCY, FRACTURES): VITD: 45.38 ng/mL (ref 30.00–100.00)

## 2020-05-16 DIAGNOSIS — M1712 Unilateral primary osteoarthritis, left knee: Secondary | ICD-10-CM | POA: Diagnosis not present

## 2020-05-16 DIAGNOSIS — Z96643 Presence of artificial hip joint, bilateral: Secondary | ICD-10-CM | POA: Diagnosis not present

## 2020-05-20 ENCOUNTER — Other Ambulatory Visit: Payer: Self-pay | Admitting: Internal Medicine

## 2020-05-20 NOTE — Telephone Encounter (Signed)
Please refill as per office routine med refill policy (all routine meds refilled for 3 mo or monthly per pt preference up to one year from last visit, then month to month grace period for 3 mo, then further med refills will have to be denied)  

## 2020-06-28 ENCOUNTER — Other Ambulatory Visit: Payer: Self-pay | Admitting: Internal Medicine

## 2020-06-28 NOTE — Telephone Encounter (Signed)
Please refill as per office routine med refill policy (all routine meds refilled for 3 mo or monthly per pt preference up to one year from last visit, then month to month grace period for 3 mo, then further med refills will have to be denied)  

## 2020-07-05 ENCOUNTER — Other Ambulatory Visit: Payer: Self-pay | Admitting: Internal Medicine

## 2020-07-08 ENCOUNTER — Other Ambulatory Visit: Payer: Self-pay | Admitting: Internal Medicine

## 2020-07-09 NOTE — Telephone Encounter (Signed)
Please refill as per office routine med refill policy (all routine meds refilled for 3 mo or monthly per pt preference up to one year from last visit, then month to month grace period for 3 mo, then further med refills will have to be denied)  

## 2020-07-11 ENCOUNTER — Telehealth: Payer: Self-pay | Admitting: Internal Medicine

## 2020-07-11 NOTE — Telephone Encounter (Signed)
Handicap renewal form dropped off on 5.26.22 by patients daughter at 11:30AM.  Placed in providers box

## 2020-07-16 NOTE — Telephone Encounter (Signed)
Paperwork has been done and patient contacted to pick it up

## 2020-07-29 ENCOUNTER — Other Ambulatory Visit: Payer: Self-pay | Admitting: Internal Medicine

## 2020-07-29 DIAGNOSIS — M1712 Unilateral primary osteoarthritis, left knee: Secondary | ICD-10-CM | POA: Diagnosis not present

## 2020-07-29 NOTE — Telephone Encounter (Signed)
Please refill as per office routine med refill policy (all routine meds refilled for 3 mo or monthly per pt preference up to one year from last visit, then month to month grace period for 3 mo, then further med refills will have to be denied)  

## 2020-08-14 NOTE — Progress Notes (Signed)
DUE TO COVID-19 ONLY ONE VISITOR IS ALLOWED TO COME WITH YOU AND STAY IN THE WAITING ROOM ONLY DURING PRE OP AND PROCEDURE DAY OF SURGERY. THE 1 VISITOR  MAY VISIT WITH YOU AFTER SURGERY IN YOUR PRIVATE ROOM DURING VISITING HOURS ONLY!  YOU NEED TO HAVE A COVID 19 TEST ON____7/08/2020 ___ @_______ , THIS TEST MUST BE DONE BEFORE SURGERY,  COVID TESTING SITE [address] [place] , IT IS ON THE RIGHT GOING OUT [address] APPROXIMATELY  2 MINUTES PAST [employer] ON THE RIGHT. ONCE YOUR COVID TEST IS COMPLETED,  PLEASE BEGIN THE QUARANTINE INSTRUCTIONS AS OUTLINED IN YOUR HANDOUT.                [name]  08/14/2020   Your procedure is scheduled on:  08/26/2020   Report to [facility] Main  Entrance   Report to admitting at    0800 AM     Call this number if you have problems the morning of surgery [phone]    REMEMBER: NO  SOLID FOOD CANDY OR GUM AFTER MIDNIGHT. CLEAR LIQUIDS UNTIL    0730am        . NOTHING BY MOUTH EXCEPT CLEAR LIQUIDS UNTIL  0730am   . PLEASE FINISH ENSURE DRINK PER SURGEON ORDER  WHICH NEEDS TO BE COMPLETED AT  0730am     .      CLEAR LIQUID DIET   Foods Allowed                                                                    Coffee and tea, regular and decaf                            Fruit ices (not with fruit pulp)                                      Iced Popsicles                                    Carbonated beverages, regular and diet                                    Cranberry, grape and apple juices Sports drinks like Gatorade Lightly seasoned clear broth or consume(fat free) Sugar, honey syrup ___________________________________________________________________      BRUSH YOUR TEETH MORNING OF SURGERY AND RINSE YOUR MOUTH OUT, NO CHEWING GUM CANDY OR MINTS.     Take these medicines the morning of surgery with A SIP OF WATER:  synthroid,   DO NOT TAKE ANY DIABETIC MEDICATIONS DAY OF YOUR  SURGERY                               You may not have any metal on your body including hair pins and  piercings  Do not wear jewelry, make-up, lotions, powders or perfumes, deodorant             Do not wear nail polish on your fingernails.  Do not shave  48 hours prior to surgery.              Men may shave face and neck.   Do not bring valuables to the hospital. [place].  Contacts, dentures or bridgework may not be worn into surgery.  Leave suitcase in the car. After surgery it may be brought to your room.     Patients discharged the day of surgery will not be allowed to drive home. IF YOU ARE HAVING SURGERY AND GOING HOME THE SAME DAY, YOU MUST HAVE AN ADULT TO DRIVE YOU HOME AND BE WITH YOU FOR 24 HOURS. YOU MAY GO HOME BY TAXI OR UBER OR ORTHERWISE, BUT AN ADULT MUST ACCOMPANY YOU HOME AND STAY WITH YOU FOR 24 HOURS.  Name and phone number of your driver:  Special Instructions: N/A              Please read over the following fact sheets you were given: _____________________________________________________________________  [facility] - Preparing for Surgery Before surgery, you can play an important role.  Because skin is not sterile, your skin needs to be as free of germs as possible.  You can reduce the number of germs on your skin by washing with CHG (chlorahexidine gluconate) soap before surgery.  CHG is an antiseptic cleaner which kills germs and bonds with the skin to continue killing germs even after washing. Please DO NOT use if you have an allergy to CHG or antibacterial soaps.  If your skin becomes reddened/irritated stop using the CHG and inform your nurse when you arrive at Short Stay. Do not shave (including legs and underarms) for at least 48 hours prior to the first CHG shower.  You may shave your face/neck. Please follow these instructions carefully:  1.  Shower with CHG Soap the night before surgery and the   morning of Surgery.  2.  If you choose to wash your hair, wash your hair first as usual with your  normal  shampoo.  3.  After you shampoo, rinse your hair and body thoroughly to remove the  shampoo.                           4.  Use CHG as you would any other liquid soap.  You can apply chg directly  to the skin and wash                       Gently with a scrungie or clean washcloth.  5.  Apply the CHG Soap to your body ONLY FROM THE NECK DOWN.   Do not use on face/ open                           Wound or open sores. Avoid contact with eyes, ears mouth and genitals (private parts).                       Wash face,  Genitals (private parts) with your normal soap.             6.  Wash thoroughly, paying special attention to the area where your surgery  will be performed.  7.  Thoroughly rinse your body with warm water from the neck down.  8.  DO NOT shower/wash with your normal soap after using and rinsing off  the CHG Soap.                9.  Pat yourself dry with a clean towel.            10.  Wear clean pajamas.            11.  Place clean sheets on your bed the night of your first shower and do not  sleep with pets. Day of Surgery : Do not apply any lotions/deodorants the morning of surgery.  Please wear clean clothes to the hospital/surgery center.  FAILURE TO FOLLOW THESE INSTRUCTIONS MAY RESULT IN THE CANCELLATION OF YOUR SURGERY PATIENT SIGNATURE_________________________________  NURSE SIGNATURE__________________________________  ________________________________________________________________________

## 2020-08-16 ENCOUNTER — Encounter (HOSPITAL_COMMUNITY): Payer: Self-pay

## 2020-08-16 ENCOUNTER — Other Ambulatory Visit: Payer: Self-pay

## 2020-08-16 ENCOUNTER — Encounter (HOSPITAL_COMMUNITY)
Admission: RE | Admit: 2020-08-16 | Discharge: 2020-08-16 | Disposition: A | Discharge status: Home / Self Care | Payer: Medicare HMO | Source: Ambulatory Visit | Attending: Orthopedic Surgery | Admitting: Orthopedic Surgery

## 2020-08-16 DIAGNOSIS — Z01818 Encounter for other preprocedural examination: Secondary | ICD-10-CM | POA: Insufficient documentation

## 2020-08-16 LAB — COMPREHENSIVE METABOLIC PANEL
ALT: 10 U/L (ref 0–44)
AST: 18 U/L (ref 15–41)
Albumin: 3.8 g/dL (ref 3.5–5.0)
Alkaline Phosphatase: 31 U/L — ABNORMAL LOW (ref 38–126)
Anion gap: 3 — ABNORMAL LOW (ref 5–15)
BUN: 14 mg/dL (ref 8–23)
CO2: 27 mmol/L (ref 22–32)
Calcium: 8.8 mg/dL — ABNORMAL LOW (ref 8.9–10.3)
Chloride: 109 mmol/L (ref 98–111)
Creatinine, Ser: 0.57 mg/dL (ref 0.44–1.00)
GFR, Estimated: >60 mL/min (ref >60)
Glucose, Bld: 89 mg/dL (ref 70–99)
Potassium: 4.1 mmol/L (ref 3.5–5.1)
Sodium: 139 mmol/L (ref 135–145)
Total Bilirubin: 0.5 mg/dL (ref 0.3–1.2)
Total Protein: 6.9 g/dL (ref 6.5–8.1)

## 2020-08-16 LAB — CBC
HCT: 39.1 % (ref 36.0–46.0)
Hemoglobin: 12.4 g/dL (ref 12.0–15.0)
MCH: 29.4 pg (ref 26.0–34.0)
MCHC: 31.7 g/dL (ref 30.0–36.0)
MCV: 92.7 fL (ref 80.0–100.0)
Platelets: 236 10*3/uL (ref 150–400)
RBC: 4.22 MIL/uL (ref 3.87–5.11)
RDW: 13.4 % (ref 11.5–15.5)
WBC: 3.3 10*3/uL — ABNORMAL LOW (ref 4.0–10.5)
nRBC: 0 % (ref 0.0–0.2)

## 2020-08-16 LAB — SURGICAL PCR SCREEN
MRSA, PCR: NEGATIVE
Staphylococcus aureus: NEGATIVE

## 2020-08-16 LAB — PROTIME-INR
INR: 1.1 (ref 0.8–1.2)
Prothrombin Time: 13.8 seconds (ref 11.4–15.2)

## 2020-08-16 NOTE — Progress Notes (Signed)
Anesthesia Review:  PCP: DR Oliver Barre LOV 04/02/20  Cardiologist : Chest x-ray : EKG : 08/16/20  Echo : Stress test: Cardiac Cath :  Activity level: cannot do a flgiht of stairs withoiut difficulty  Sleep Study/ CPAP : none  Fasting Blood Sugar :      / Checks Blood Sugar -- times a day:   Blood Thinner/ Instructions /Last Dose: ASA / Instructions/ Last Dose :   81 mg Aspirin

## 2020-08-22 ENCOUNTER — Other Ambulatory Visit (HOSPITAL_COMMUNITY)
Admission: RE | Admit: 2020-08-22 | Discharge: 2020-08-22 | Disposition: A | Discharge status: Home / Self Care | Payer: Medicare HMO | Source: Ambulatory Visit | Attending: Orthopedic Surgery | Admitting: Orthopedic Surgery

## 2020-08-22 DIAGNOSIS — Z01812 Encounter for preprocedural laboratory examination: Secondary | ICD-10-CM | POA: Diagnosis not present

## 2020-08-22 DIAGNOSIS — Z20822 Contact with and (suspected) exposure to covid-19: Secondary | ICD-10-CM | POA: Insufficient documentation

## 2020-08-23 LAB — SARS CORONAVIRUS 2 (TAT 6-24 HRS): SARS Coronavirus 2: NEGATIVE

## 2020-08-25 MED ORDER — BUPIVACAINE LIPOSOME 1.3 % IJ SUSP
20.0000 mL | INTRAMUSCULAR | Status: DC
  Filled 2020-08-25: qty 20

## 2020-08-26 ENCOUNTER — Observation Stay (HOSPITAL_COMMUNITY)
Admission: RE | Admit: 2020-08-26 | Discharge: 2020-08-27 | Disposition: A | Discharge status: Home / Self Care | Payer: Medicare HMO | Attending: Orthopedic Surgery | Admitting: Orthopedic Surgery

## 2020-08-26 ENCOUNTER — Ambulatory Visit (HOSPITAL_COMMUNITY): Payer: Medicare HMO | Admitting: Anesthesiology

## 2020-08-26 ENCOUNTER — Other Ambulatory Visit: Payer: Self-pay

## 2020-08-26 ENCOUNTER — Encounter (HOSPITAL_COMMUNITY)
Admission: RE | Disposition: A | Discharge status: Home / Self Care | Payer: Self-pay | Source: Home / Self Care | Attending: Orthopedic Surgery

## 2020-08-26 ENCOUNTER — Encounter (HOSPITAL_COMMUNITY): Payer: Self-pay | Admitting: Orthopedic Surgery

## 2020-08-26 DIAGNOSIS — M1710 Unilateral primary osteoarthritis, unspecified knee: Secondary | ICD-10-CM

## 2020-08-26 DIAGNOSIS — M1712 Unilateral primary osteoarthritis, left knee: Principal | ICD-10-CM | POA: Insufficient documentation

## 2020-08-26 DIAGNOSIS — I10 Essential (primary) hypertension: Secondary | ICD-10-CM | POA: Diagnosis not present

## 2020-08-26 DIAGNOSIS — Z7982 Long term (current) use of aspirin: Secondary | ICD-10-CM | POA: Insufficient documentation

## 2020-08-26 DIAGNOSIS — M179 Osteoarthritis of knee, unspecified: Secondary | ICD-10-CM

## 2020-08-26 DIAGNOSIS — Z79899 Other long term (current) drug therapy: Secondary | ICD-10-CM | POA: Insufficient documentation

## 2020-08-26 DIAGNOSIS — Z87891 Personal history of nicotine dependence: Secondary | ICD-10-CM | POA: Insufficient documentation

## 2020-08-26 DIAGNOSIS — G8918 Other acute postprocedural pain: Secondary | ICD-10-CM | POA: Diagnosis not present

## 2020-08-26 DIAGNOSIS — Z96642 Presence of left artificial hip joint: Secondary | ICD-10-CM | POA: Diagnosis not present

## 2020-08-26 DIAGNOSIS — E039 Hypothyroidism, unspecified: Secondary | ICD-10-CM | POA: Insufficient documentation

## 2020-08-26 HISTORY — PX: TOTAL KNEE ARTHROPLASTY: SHX125

## 2020-08-26 LAB — TYPE AND SCREEN
ABO/RH(D): O POS
Antibody Screen: NEGATIVE

## 2020-08-26 LAB — ABO/RH: ABO/RH(D): O POS

## 2020-08-26 SURGERY — ARTHROPLASTY, KNEE, TOTAL
Anesthesia: General | Site: Knee | Laterality: Left

## 2020-08-26 MED ORDER — OXYCODONE HCL 5 MG/5ML PO SOLN: 5.0000 mg | Freq: Once | ORAL | Status: DC | PRN

## 2020-08-26 MED ORDER — ACETAMINOPHEN 10 MG/ML IV SOLN
1000.0000 mg | Freq: Once | INTRAVENOUS | Status: AC
  Administered 2020-08-26: 1000 mg via INTRAVENOUS
  Filled 2020-08-26: qty 100

## 2020-08-26 MED ORDER — FENTANYL CITRATE (PF) 100 MCG/2ML IJ SOLN
INTRAMUSCULAR | Status: DC | PRN
  Administered 2020-08-26 (×2): 25 ug via INTRAVENOUS
  Administered 2020-08-26: 50 ug via INTRAVENOUS

## 2020-08-26 MED ORDER — LIDOCAINE 2% (20 MG/ML) 5 ML SYRINGE
INTRAMUSCULAR | Status: DC | PRN
  Administered 2020-08-26: 80 mg via INTRAVENOUS

## 2020-08-26 MED ORDER — TRANEXAMIC ACID-NACL 1000-0.7 MG/100ML-% IV SOLN
1000.0000 mg | INTRAVENOUS | Status: AC
  Administered 2020-08-26: 1000 mg via INTRAVENOUS
  Filled 2020-08-26: qty 100

## 2020-08-26 MED ORDER — SODIUM CHLORIDE 0.9 % IR SOLN
Status: DC | PRN
  Administered 2020-08-26 (×2): 1000 mL

## 2020-08-26 MED ORDER — ORAL CARE MOUTH RINSE: 15.0000 mL | Freq: Once | OROMUCOSAL | Status: AC

## 2020-08-26 MED ORDER — SODIUM CHLORIDE (PF) 0.9 % IJ SOLN
INTRAMUSCULAR | Status: DC | PRN
  Administered 2020-08-26: 60 mL

## 2020-08-26 MED ORDER — DEXAMETHASONE SODIUM PHOSPHATE 10 MG/ML IJ SOLN
INTRAMUSCULAR | Status: DC | PRN
  Administered 2020-08-26: 4 mg via INTRAVENOUS

## 2020-08-26 MED ORDER — MIDAZOLAM HCL 2 MG/2ML IJ SOLN
1.0000 mg | INTRAMUSCULAR | Status: DC
  Administered 2020-08-26: 1 mg via INTRAVENOUS
  Filled 2020-08-26: qty 2

## 2020-08-26 MED ORDER — LEVOTHYROXINE SODIUM 75 MCG PO TABS
75.0000 ug | ORAL_TABLET | Freq: Every day | ORAL | Status: DC
  Administered 2020-08-27: 75 ug via ORAL
  Filled 2020-08-26: qty 1

## 2020-08-26 MED ORDER — METOCLOPRAMIDE HCL 5 MG PO TABS: 5.0000 mg | ORAL_TABLET | Freq: Three times a day (TID) | ORAL | Status: DC | PRN

## 2020-08-26 MED ORDER — IRBESARTAN 75 MG PO TABS
75.0000 mg | ORAL_TABLET | Freq: Every day | ORAL | Status: DC
  Administered 2020-08-27: 75 mg via ORAL
  Filled 2020-08-26: qty 1

## 2020-08-26 MED ORDER — METOCLOPRAMIDE HCL 5 MG/ML IJ SOLN: 5.0000 mg | Freq: Three times a day (TID) | INTRAMUSCULAR | Status: DC | PRN

## 2020-08-26 MED ORDER — OXYCODONE HCL 5 MG PO TABS: 5.0000 mg | ORAL_TABLET | ORAL | Status: DC | PRN

## 2020-08-26 MED ORDER — ONDANSETRON HCL 4 MG/2ML IJ SOLN: 4.0000 mg | Freq: Four times a day (QID) | INTRAMUSCULAR | Status: DC | PRN

## 2020-08-26 MED ORDER — METHOCARBAMOL 500 MG IVPB - SIMPLE MED
500.0000 mg | Freq: Four times a day (QID) | INTRAVENOUS | Status: DC | PRN
  Filled 2020-08-26: qty 50

## 2020-08-26 MED ORDER — MEPERIDINE HCL 50 MG/ML IJ SOLN: 6.2500 mg | INTRAMUSCULAR | Status: DC | PRN

## 2020-08-26 MED ORDER — DEXMEDETOMIDINE (PRECEDEX) IN NS 20 MCG/5ML (4 MCG/ML) IV SYRINGE
PREFILLED_SYRINGE | INTRAVENOUS | Status: AC
  Filled 2020-08-26: qty 5

## 2020-08-26 MED ORDER — DEXAMETHASONE SODIUM PHOSPHATE 10 MG/ML IJ SOLN: 8.0000 mg | Freq: Once | INTRAMUSCULAR | Status: DC

## 2020-08-26 MED ORDER — METHOCARBAMOL 500 MG PO TABS: 500.0000 mg | ORAL_TABLET | Freq: Four times a day (QID) | ORAL | Status: DC | PRN

## 2020-08-26 MED ORDER — TRAMADOL HCL 50 MG PO TABS: 50.0000 mg | ORAL_TABLET | Freq: Four times a day (QID) | ORAL | Status: DC | PRN

## 2020-08-26 MED ORDER — CHLORHEXIDINE GLUCONATE 0.12 % MT SOLN
15.0000 mL | Freq: Once | OROMUCOSAL | Status: AC
  Administered 2020-08-26: 15 mL via OROMUCOSAL

## 2020-08-26 MED ORDER — MIDAZOLAM HCL 2 MG/2ML IJ SOLN: 0.5000 mg | Freq: Once | INTRAMUSCULAR | Status: DC | PRN

## 2020-08-26 MED ORDER — DOCUSATE SODIUM 100 MG PO CAPS
100.0000 mg | ORAL_CAPSULE | Freq: Two times a day (BID) | ORAL | Status: DC
  Administered 2020-08-26 – 2020-08-27 (×2): 100 mg via ORAL
  Filled 2020-08-26 (×2): qty 1

## 2020-08-26 MED ORDER — DIPHENHYDRAMINE HCL 12.5 MG/5ML PO ELIX: 12.5000 mg | ORAL_SOLUTION | ORAL | Status: DC | PRN

## 2020-08-26 MED ORDER — SODIUM CHLORIDE 0.9 % IV SOLN: INTRAVENOUS | Status: DC

## 2020-08-26 MED ORDER — LACTATED RINGERS IV SOLN: INTRAVENOUS | Status: DC

## 2020-08-26 MED ORDER — DEXAMETHASONE SODIUM PHOSPHATE 10 MG/ML IJ SOLN
10.0000 mg | Freq: Once | INTRAMUSCULAR | Status: AC
  Administered 2020-08-27: 10 mg via INTRAVENOUS
  Filled 2020-08-26: qty 1

## 2020-08-26 MED ORDER — FENTANYL CITRATE (PF) 100 MCG/2ML IJ SOLN
INTRAMUSCULAR | Status: AC
  Filled 2020-08-26: qty 2

## 2020-08-26 MED ORDER — MENTHOL 3 MG MT LOZG: 1.0000 | LOZENGE | OROMUCOSAL | Status: DC | PRN

## 2020-08-26 MED ORDER — ASPIRIN EC 325 MG PO TBEC
325.0000 mg | DELAYED_RELEASE_TABLET | Freq: Two times a day (BID) | ORAL | Status: DC
  Administered 2020-08-27: 325 mg via ORAL
  Filled 2020-08-26: qty 1

## 2020-08-26 MED ORDER — BISACODYL 10 MG RE SUPP: 10.0000 mg | Freq: Every day | RECTAL | Status: DC | PRN

## 2020-08-26 MED ORDER — CEFAZOLIN SODIUM-DEXTROSE 2-4 GM/100ML-% IV SOLN
2.0000 g | INTRAVENOUS | Status: AC
  Administered 2020-08-26: 2 g via INTRAVENOUS
  Filled 2020-08-26: qty 100

## 2020-08-26 MED ORDER — MORPHINE SULFATE (PF) 2 MG/ML IV SOLN: 0.5000 mg | INTRAVENOUS | Status: DC | PRN

## 2020-08-26 MED ORDER — ONDANSETRON HCL 4 MG/2ML IJ SOLN
INTRAMUSCULAR | Status: DC | PRN
  Administered 2020-08-26: 4 mg via INTRAVENOUS

## 2020-08-26 MED ORDER — FLEET ENEMA 7-19 GM/118ML RE ENEM: 1.0000 | ENEMA | Freq: Once | RECTAL | Status: DC | PRN

## 2020-08-26 MED ORDER — PROPOFOL 10 MG/ML IV BOLUS
INTRAVENOUS | Status: DC | PRN
  Administered 2020-08-26: 5 mg via INTRAVENOUS
  Administered 2020-08-26: 175 mg via INTRAVENOUS
  Administered 2020-08-26: 10 mg via INTRAVENOUS
  Administered 2020-08-26 (×2): 5 mg via INTRAVENOUS

## 2020-08-26 MED ORDER — HYDROMORPHONE HCL 1 MG/ML IJ SOLN: 0.2500 mg | INTRAMUSCULAR | Status: DC | PRN

## 2020-08-26 MED ORDER — ONDANSETRON HCL 4 MG PO TABS: 4.0000 mg | ORAL_TABLET | Freq: Four times a day (QID) | ORAL | Status: DC | PRN

## 2020-08-26 MED ORDER — SODIUM CHLORIDE (PF) 0.9 % IJ SOLN
INTRAMUSCULAR | Status: AC
  Filled 2020-08-26: qty 10

## 2020-08-26 MED ORDER — ONDANSETRON HCL 4 MG/2ML IJ SOLN
INTRAMUSCULAR | Status: AC
  Filled 2020-08-26: qty 2

## 2020-08-26 MED ORDER — DEXMEDETOMIDINE (PRECEDEX) IN NS 20 MCG/5ML (4 MCG/ML) IV SYRINGE
PREFILLED_SYRINGE | INTRAVENOUS | Status: DC | PRN
  Administered 2020-08-26 (×5): 4 ug via INTRAVENOUS

## 2020-08-26 MED ORDER — DEXAMETHASONE SODIUM PHOSPHATE 10 MG/ML IJ SOLN
INTRAMUSCULAR | Status: AC
  Filled 2020-08-26: qty 1

## 2020-08-26 MED ORDER — PHENOL 1.4 % MT LIQD: 1.0000 | OROMUCOSAL | Status: DC | PRN

## 2020-08-26 MED ORDER — PROPOFOL 1000 MG/100ML IV EMUL
INTRAVENOUS | Status: AC
  Filled 2020-08-26: qty 100

## 2020-08-26 MED ORDER — SODIUM CHLORIDE 0.9 % IV SOLN
2.0000 g | Freq: Four times a day (QID) | INTRAVENOUS | Status: AC
  Administered 2020-08-26 (×2): 2 g via INTRAVENOUS
  Filled 2020-08-26 (×2): qty 2

## 2020-08-26 MED ORDER — ACETAMINOPHEN 500 MG PO TABS
1000.0000 mg | ORAL_TABLET | Freq: Four times a day (QID) | ORAL | Status: AC
  Administered 2020-08-26 – 2020-08-27 (×4): 1000 mg via ORAL
  Filled 2020-08-26 (×4): qty 2

## 2020-08-26 MED ORDER — ROPIVACAINE HCL 7.5 MG/ML IJ SOLN
INTRAMUSCULAR | Status: DC | PRN
  Administered 2020-08-26: 20 mL via PERINEURAL

## 2020-08-26 MED ORDER — OXYCODONE HCL 5 MG PO TABS: 5.0000 mg | ORAL_TABLET | Freq: Once | ORAL | Status: DC | PRN

## 2020-08-26 MED ORDER — PROMETHAZINE HCL 25 MG/ML IJ SOLN: 6.2500 mg | INTRAMUSCULAR | Status: DC | PRN

## 2020-08-26 MED ORDER — PRAVASTATIN SODIUM 20 MG PO TABS: 20.0000 mg | ORAL_TABLET | Freq: Every day | ORAL | Status: DC

## 2020-08-26 MED ORDER — POVIDONE-IODINE 10 % EX SWAB
2.0000 "application " | Freq: Once | CUTANEOUS | Status: AC
  Administered 2020-08-26: 2 via TOPICAL

## 2020-08-26 MED ORDER — FENTANYL CITRATE (PF) 100 MCG/2ML IJ SOLN
50.0000 ug | INTRAMUSCULAR | Status: DC
  Administered 2020-08-26: 50 ug via INTRAVENOUS
  Filled 2020-08-26: qty 2

## 2020-08-26 MED ORDER — LIDOCAINE 2% (20 MG/ML) 5 ML SYRINGE
INTRAMUSCULAR | Status: AC
  Filled 2020-08-26: qty 5

## 2020-08-26 MED ORDER — POLYETHYLENE GLYCOL 3350 17 G PO PACK: 17.0000 g | PACK | Freq: Every day | ORAL | Status: DC | PRN

## 2020-08-26 MED ORDER — BUPIVACAINE LIPOSOME 1.3 % IJ SUSP
INTRAMUSCULAR | Status: DC | PRN
  Administered 2020-08-26: 20 mL

## 2020-08-26 SURGICAL SUPPLY — 59 items
ATTUNE MED DOME PAT 38 KNEE (Knees) ×1 IMPLANT
ATTUNE PS FEM LT SZ 5 CEM KNEE (Femur) ×1 IMPLANT
ATTUNE PSRP INSR SZ 5 10M KNEE (Insert) ×1 IMPLANT
BAG COUNTER SPONGE SURGICOUNT (BAG) ×1 IMPLANT
BAG DECANTER FOR FLEXI CONT (MISCELLANEOUS) ×1 IMPLANT
BAG SPEC THK2 15X12 ZIP CLS (MISCELLANEOUS) ×1
BAG SPNG CNTER NS LX DISP (BAG) ×1
BAG ZIPLOCK 12X15 (MISCELLANEOUS) ×2 IMPLANT
BASE TIBIAL ROT PLAT SZ 5 KNEE (Knees) IMPLANT
BLADE SAG 18X100X1.27 (BLADE) ×2 IMPLANT
BLADE SAW SGTL 11.0X1.19X90.0M (BLADE) ×2 IMPLANT
BNDG ELASTIC 6X5.8 VLCR STR LF (GAUZE/BANDAGES/DRESSINGS) ×2 IMPLANT
BOWL SMART MIX CTS (DISPOSABLE) ×2 IMPLANT
BSPLAT TIB 5 CMNT ROT PLAT STR (Knees) ×1 IMPLANT
CEMENT HV SMART SET (Cement) ×4 IMPLANT
COVER SURGICAL LIGHT HANDLE (MISCELLANEOUS) ×2 IMPLANT
CUFF TOURN SGL QUICK 34 (TOURNIQUET CUFF) ×2
CUFF TRNQT CYL 34X4.125X (TOURNIQUET CUFF) ×1 IMPLANT
DECANTER SPIKE VIAL GLASS SM (MISCELLANEOUS) ×1 IMPLANT
DRAPE U-SHAPE 47X51 STRL (DRAPES) ×2 IMPLANT
DRSG AQUACEL AG ADV 3.5X10 (GAUZE/BANDAGES/DRESSINGS) ×2 IMPLANT
DURAPREP 26ML APPLICATOR (WOUND CARE) ×2 IMPLANT
ELECT REM PT RETURN 15FT ADLT (MISCELLANEOUS) ×2 IMPLANT
GLOVE SRG 8 PF TXTR STRL LF DI (GLOVE) ×1 IMPLANT
GLOVE SURG ENC MOIS LTX SZ6.5 (GLOVE) ×2 IMPLANT
GLOVE SURG ENC MOIS LTX SZ8 (GLOVE) ×4 IMPLANT
GLOVE SURG UNDER POLY LF SZ7 (GLOVE) ×2 IMPLANT
GLOVE SURG UNDER POLY LF SZ8 (GLOVE) ×2
GLOVE SURG UNDER POLY LF SZ8.5 (GLOVE) ×2 IMPLANT
GOWN STRL REUS W/TWL LRG LVL3 (GOWN DISPOSABLE) ×4 IMPLANT
GOWN STRL REUS W/TWL XL LVL3 (GOWN DISPOSABLE) ×2 IMPLANT
HANDPIECE INTERPULSE COAX TIP (DISPOSABLE) ×2
HOLDER FOLEY CATH W/STRAP (MISCELLANEOUS) ×1 IMPLANT
IMMOBILIZER KNEE 16 UNIV (MISCELLANEOUS) ×1 IMPLANT
IMMOBILIZER KNEE 20 (SOFTGOODS) ×2
IMMOBILIZER KNEE 20 THIGH 36 (SOFTGOODS) ×1 IMPLANT
KIT TURNOVER KIT A (KITS) ×2 IMPLANT
MANIFOLD NEPTUNE II (INSTRUMENTS) ×2 IMPLANT
NS IRRIG 1000ML POUR BTL (IV SOLUTION) ×2 IMPLANT
PACK TOTAL KNEE CUSTOM (KITS) ×2 IMPLANT
PADDING CAST COTTON 6X4 STRL (CAST SUPPLIES) ×3 IMPLANT
PENCIL SMOKE EVACUATOR (MISCELLANEOUS) ×2 IMPLANT
PIN DRILL FIX HALF THREAD (BIT) ×1 IMPLANT
PIN STEINMAN FIXATION KNEE (PIN) ×1 IMPLANT
PROTECTOR NERVE ULNAR (MISCELLANEOUS) ×2 IMPLANT
SET HNDPC FAN SPRY TIP SCT (DISPOSABLE) ×1 IMPLANT
SPONGE T-LAP 18X18 ~~LOC~~+RFID (SPONGE) ×3 IMPLANT
STRIP CLOSURE SKIN 1/2X4 (GAUZE/BANDAGES/DRESSINGS) ×4 IMPLANT
SUT MNCRL AB 4-0 PS2 18 (SUTURE) ×2 IMPLANT
SUT STRATAFIX 0 PDS 27 VIOLET (SUTURE) ×2
SUT VIC AB 2-0 CT1 27 (SUTURE) ×8
SUT VIC AB 2-0 CT1 TAPERPNT 27 (SUTURE) ×3 IMPLANT
SUTURE STRATFX 0 PDS 27 VIOLET (SUTURE) ×1 IMPLANT
TIBIAL BASE ROT PLAT SZ 5 KNEE (Knees) ×2 IMPLANT
TRAY FOLEY MTR SLVR 14FR STAT (SET/KITS/TRAYS/PACK) ×1 IMPLANT
TRAY FOLEY MTR SLVR 16FR STAT (SET/KITS/TRAYS/PACK) ×1 IMPLANT
TUBE SUCTION HIGH CAP CLEAR NV (SUCTIONS) ×2 IMPLANT
WATER STERILE IRR 1000ML POUR (IV SOLUTION) ×4 IMPLANT
WRAP KNEE MAXI GEL POST OP (GAUZE/BANDAGES/DRESSINGS) ×2 IMPLANT

## 2020-08-26 NOTE — Interval H&P Note (Signed)
History and Physical Interval Note:  08/26/2020 8:20 AM  [name]  has presented today for surgery, with the diagnosis of left knee osteoarthritis.  The various methods of treatment have been discussed with the patient and family. After consideration of risks, benefits and other options for treatment, the patient has consented to  Procedure(s) with comments: TOTAL KNEE ARTHROPLASTY (Left) - as a surgical intervention.  The patient's history has been reviewed, patient examined, no change in status, stable for surgery.  I have reviewed the patient's chart and labs.  Questions were answered to the patient's satisfaction.     Homero Fellers Leontae Bostock

## 2020-08-26 NOTE — Plan of Care (Signed)
?  Problem: Education: ?Goal: Knowledge of General Education information will improve ?Description: Including pain rating scale, medication(s)/side effects and non-pharmacologic comfort measures ?Outcome: Progressing ?  ?Problem: Activity: ?Goal: Risk for activity intolerance will decrease ?Outcome: Progressing ?  ?Problem: Nutrition: ?Goal: Adequate nutrition will be maintained ?Outcome: Progressing ?  ?Problem: Elimination: ?Goal: Will not experience complications related to bowel motility ?Outcome: Progressing ?  ?Problem: Pain Managment: ?Goal: General experience of comfort will improve ?Outcome: Progressing ?  ?Problem: Safety: ?Goal: Ability to remain free from injury will improve ?Outcome: Progressing ?  ?Problem: Education: ?Goal: Knowledge of the prescribed therapeutic regimen will improve ?Outcome: Progressing ?  ?Problem: Activity: ?Goal: Ability to avoid complications of mobility impairment will improve ?Outcome: Progressing ?  ?Problem: Pain Management: ?Goal: Pain level will decrease with appropriate interventions ?Outcome: Progressing ?  ?

## 2020-08-26 NOTE — Op Note (Signed)
OPERATIVE REPORT-TOTAL KNEE ARTHROPLASTY   Pre-operative diagnosis- Osteoarthritis  Left knee(s)  Post-operative diagnosis- Osteoarthritis Left knee(s)  Procedure-  Left  Total Knee Arthroplasty  Surgeon- Lori Rankin. Trish Mancinelli, MD  Assistant- Leilani Able, PA-C   Anesthesia-  GA combined with regional for post-op pain  EBL- 25 ml   Drains None  Tourniquet time-  Total Tourniquet Time Documented: Thigh (Left) - 33 minutes Total: Thigh (Left) - 33 minutes     Complications- None  Condition-PACU - hemodynamically stable.   Brief Clinical Note  [name] is a 75 y.o. year old female with end stage OA of her left knee with progressively worsening pain and dysfunction. She has constant pain, with activity and at rest and significant functional deficits with difficulties even with ADLs. She has had extensive non-op management including analgesics, injections of cortisone and viscosupplements, and home exercise program, but remains in significant pain with significant dysfunction. Radiographs show bone on bone arthritis medial and patellofemoral. She presents now for left Total Knee Arthroplasty.     Procedure in detail---   The patient is brought into the operating room and positioned supine on the operating table. After successful administration of  GA combined with regional for post-op pain,   a tourniquet is placed high on the  Left thigh(s) and the lower extremity is prepped and draped in the usual sterile fashion. Time out is performed by the operating team and then the  Left lower extremity is wrapped in Esmarch, knee flexed and the tourniquet inflated to 300 mmHg.       A midline incision is made with a ten blade through the subcutaneous tissue to the level of the extensor mechanism. A fresh blade is used to make a medial parapatellar arthrotomy. Soft tissue over the proximal medial tibia is subperiosteally elevated to the joint line with a knife and into the semimembranosus  bursa with a Cobb elevator. Soft tissue over the proximal lateral tibia is elevated with attention being paid to avoiding the patellar tendon on the tibial tubercle. The patella is everted, knee flexed 90 degrees and the ACL and PCL are removed. Findings are bone on bone all 3 compartments with massive global osteophytes        The drill is used to create a starting hole in the distal femur and the canal is thoroughly irrigated with sterile saline to remove the fatty contents. The 5 degree Left  valgus alignment guide is placed into the femoral canal and the distal femoral cutting block is pinned to remove 9 mm off the distal femur. Resection is made with an oscillating saw.      The tibia is subluxed forward and the menisci are removed. The extramedullary alignment guide is placed referencing proximally at the medial aspect of the tibial tubercle and distally along the second metatarsal axis and tibial crest. The block is pinned to remove 53mm off the more deficient medial  side. Resection is made with an oscillating saw. Size 5is the most appropriate size for the tibia and the proximal tibia is prepared with the modular drill and keel punch for that size.      The femoral sizing guide is placed and size 5 is most appropriate. Rotation is marked off the epicondylar axis and confirmed by creating a rectangular flexion gap at 90 degrees. The size 5 cutting block is pinned in this rotation and the anterior, posterior and chamfer cuts are made with the oscillating saw. The intercondylar block is then placed  and that cut is made.      Trial size 5 tibial component, trial size 5 posterior stabilized femur and a 10  mm posterior stabilized rotating platform insert trial is placed. Full extension is achieved with excellent varus/valgus and anterior/posterior balance throughout full range of motion. The patella is everted and thickness measured to be 22  mm. Free hand resection is taken to 12 mm, a 38 template is placed,  lug holes are drilled, trial patella is placed, and it tracks normally. Osteophytes are removed off the posterior femur with the trial in place. All trials are removed and the cut bone surfaces prepared with pulsatile lavage. Cement is mixed and once ready for implantation, the size 5 tibial implant, size  5 posterior stabilized femoral component, and the size 38 patella are cemented in place and the patella is held with the clamp. The trial insert is placed and the knee held in full extension. The Exparel (20 ml mixed with 60 ml saline) is injected into the extensor mechanism, posterior capsule, medial and lateral gutters and subcutaneous tissues.  All extruded cement is removed and once the cement is hard the permanent 10 mm posterior stabilized rotating platform insert is placed into the tibial tray.      The wound is copiously irrigated with saline solution and the extensor mechanism closed with # 0 Stratofix suture. The tourniquet is released for a total tourniquet time of 33  minutes. Flexion against gravity is 140 degrees and the patella tracks normally. Subcutaneous tissue is closed with 2.0 vicryl and subcuticular with running 4.0 Monocryl. The incision is cleaned and dried and steri-strips and a bulky sterile dressing are applied. The limb is placed into a knee immobilizer and the patient is awakened and transported to recovery in stable condition.      Please note that a surgical assistant was a medical necessity for this procedure in order to perform it in a safe and expeditious manner. Surgical assistant was necessary to retract the ligaments and vital neurovascular structures to prevent injury to them and also necessary for proper positioning of the limb to allow for anatomic placement of the prosthesis.   Lori Rankin [name], MD    08/26/2020, 10:41 AM

## 2020-08-26 NOTE — Anesthesia Procedure Notes (Signed)
Procedure Name: LMA Insertion Date/Time: 08/26/2020 9:41 AM Performed by: Theodosia Quay, CRNA Pre-anesthesia Checklist: Patient identified, Emergency Drugs available, Suction available and Patient being monitored Patient Re-evaluated:Patient Re-evaluated prior to induction Oxygen Delivery Method: Circle System Utilized Preoxygenation: Pre-oxygenation with 100% oxygen Induction Type: IV induction Ventilation: Mask ventilation without difficulty LMA: LMA inserted LMA Size: 4.0 Number of attempts: 1 Airway Equipment and Method: Bite block Placement Confirmation: positive ETCO2 Tube secured with: Tape Dental Injury: Teeth and Oropharynx as per pre-operative assessment

## 2020-08-26 NOTE — Anesthesia Procedure Notes (Signed)
Spinal  Patient location during procedure: OR End time: 08/26/2020 9:34 AM Reason for block: surgical anesthesia Staffing Performed: anesthesiologist  Anesthesiologist: Jairo Ben, MD Preanesthetic Checklist Completed: patient identified, IV checked, site marked, risks and benefits discussed, surgical consent, monitors and equipment checked, pre-op evaluation and timeout performed Spinal Block Patient position: sitting Prep: DuraPrep and site prepped and draped Patient monitoring: blood pressure, continuous pulse ox, cardiac monitor and heart rate Approach: midline Location: L3-4 Injection technique: single-shot Needle Needle type: Pencan and Introducer  Needle gauge: 25 G Needle length: 9 cm Assessment Events: other event Additional Notes Pt identified in Operating room.  Monitors applied. Working IV access confirmed. Sterile prep, drape lumbar spine.  1% lido local L 3,4.  #24ga Pencan, all attempts os (pt obese, lordotic, scoliotic).  Will convert to GA, pt in agreement.  Sandford Craze, MD

## 2020-08-26 NOTE — Progress Notes (Signed)
Orthopedic Tech Progress Note Patient Details:  [name] 03-26-1945 [id number]  CPM Left Knee CPM Left Knee: On Left Knee Flexion (Degrees): 40 Left Knee Extension (Degrees): 10  Post Interventions Patient Tolerated: Well Instructions Provided: Care of device  Saul Fordyce 08/26/2020, 11:58 AM

## 2020-08-26 NOTE — Progress Notes (Signed)
Assisted Dr. Carswell Jackson with left, ultrasound guided, adductor canal block. Side rails up, monitors on throughout procedure. See vital signs in flow sheet. Tolerated Procedure well.  

## 2020-08-26 NOTE — Anesthesia Procedure Notes (Addendum)
Anesthesia Regional Block: Adductor canal block   Pre-Anesthetic Checklist: , timeout performed,  Correct Patient, Correct Site, Correct Laterality,  Correct Procedure, Correct Position, site marked,  Risks and benefits discussed,  Surgical consent,  Pre-op evaluation,  At surgeon's request and post-op pain management  Laterality: Lower and Left  Prep: chloraprep       Needles:   Needle Type: Echogenic Needle     Needle Length: 9cm  Needle Gauge: 21     Additional Needles:   Procedures:,,,, ultrasound used (permanent image in chart),,    Narrative:  Start time: 08/26/2020 8:49 AM End time: 08/26/2020 8:56 AM Injection made incrementally with aspirations every 5 mL.  Performed by: Personally  Anesthesiologist: Jairo Ben, MD  Additional Notes: Pt identified in Holding room.  Monitors applied. Working IV access confirmed. Sterile prep L thigh.  #21ga ECHOgenic Arrow block needle into adductor canal with US guidance.  20cc 0.75% Ropivacaine injected incrementally after negative test dose.  Patient asymptomatic, VSS, no heme aspirated, tolerated well.   Sandford Craze, MD

## 2020-08-26 NOTE — Transfer of Care (Signed)
Immediate Anesthesia Transfer of Care Note  Patient: [name]  Procedure(s) Performed: Procedure(s) with comments: TOTAL KNEE ARTHROPLASTY (Left) -  Patient Location: PACU  Anesthesia Type:GA combined with regional for post-op pain  Level of Consciousness:  sedated, patient cooperative and responds to stimulation  Airway & Oxygen Therapy:Patient Spontanous Breathing and Patient connected to face mask oxgen  Post-op Assessment:  Report given to PACU RN and Post -op Vital signs reviewed and stable  Post vital signs:  Reviewed and stable  Last Vitals:  Vitals:   08/26/20 0905 08/26/20 0910  BP: 136/72 129/74  Pulse: 80 75  Resp: (!) 22 (!) 22  Temp:    SpO2: 100% 100%    Complications: No apparent anesthesia complications

## 2020-08-26 NOTE — Discharge Instructions (Signed)
 Frank Aluisio, MD Total Joint Specialist [facility] [address] [place], [place] [address] [phone]  TOTAL KNEE REPLACEMENT POSTOPERATIVE DIRECTIONS    Knee Rehabilitation, Guidelines Following Surgery  Results after knee surgery are often greatly improved when you follow the exercise, range of motion and muscle strengthening exercises prescribed by your doctor. Safety measures are also important to protect the knee from further injury. If any of these exercises cause you to have increased pain or swelling in your knee joint, decrease the amount until you are comfortable again and slowly increase them. If you have problems or questions, call your caregiver or physical therapist for advice.   BLOOD CLOT PREVENTION Take a 325 mg Aspirin two times a day for three weeks following surgery. Then resume one 81 mg aspirin once a day. You may resume your vitamins/supplements upon discharge from the hospital. Do not take any NSAIDs (Advil, Aleve, Ibuprofen, Meloxicam, etc.) until you have discontinued the 325 mg Aspirin.  HOME CARE INSTRUCTIONS  Remove items at home which could result in a fall. This includes throw rugs or furniture in walking pathways.  ICE to the affected knee as much as tolerated. Icing helps control swelling. If the swelling is well controlled you will be more comfortable and rehab easier. Continue to use ice on the knee for pain and swelling from surgery. You may notice swelling that will progress down to the foot and ankle. This is normal after surgery. Elevate the leg when you are not up walking on it.    Continue to use the breathing machine which will help keep your temperature down. It is common for your temperature to cycle up and down following surgery, especially at night when you are not up moving around and exerting yourself. The breathing machine keeps your lungs expanded and your temperature down. Do not place pillow under the  operative knee, focus on keeping the knee straight while resting  DIET You may resume your previous home diet once you are discharged from the hospital.  DRESSING / WOUND CARE / SHOWERING Keep your bulky bandage on for 2 days. On the third post-operative day you may remove the Ace bandage and gauze. There is a waterproof adhesive bandage on your skin which will stay in place until your first follow-up appointment. Once you remove this you will not need to place another bandage You may begin showering 3 days following surgery, but do not submerge the incision under water.  ACTIVITY For the first 5 days, the key is rest and control of pain and swelling Do your home exercises twice a day starting on post-operative day 3. On the days you go to physical therapy, just do the home exercises once that day. You should rest, ice and elevate the leg for 50 minutes out of every hour. Get up and walk/stretch for 10 minutes per hour. After 5 days you can increase your activity slowly as tolerated. Walk with your walker as instructed. Use the walker until you are comfortable transitioning to a cane. Walk with the cane in the opposite hand of the operative leg. You may discontinue the cane once you are comfortable and walking steadily. Avoid periods of inactivity such as sitting longer than an hour when not asleep. This helps prevent blood clots.  You may discontinue the knee immobilizer once you are able to perform a straight leg raise while lying down. You may resume a sexual relationship in one month or when given the OK by   your doctor.  You may return to work once you are cleared by your doctor.  Do not drive a car for 6 weeks or until released by your surgeon.  Do not drive while taking narcotics.  TED HOSE STOCKINGS Wear the elastic stockings on both legs for three weeks following surgery during the day. You may remove them at night for sleeping.  WEIGHT BEARING Weight bearing as tolerated with assist  device (walker, cane, etc) as directed, use it as long as suggested by your surgeon or therapist, typically at least 4-6 weeks.  POSTOPERATIVE CONSTIPATION PROTOCOL Constipation - defined medically as fewer than three stools per week and severe constipation as less than one stool per week.  One of the most common issues patients have following surgery is constipation.  Even if you have a regular bowel pattern at home, your normal regimen is likely to be disrupted due to multiple reasons following surgery.  Combination of anesthesia, postoperative narcotics, change in appetite and fluid intake all can affect your bowels.  In order to avoid complications following surgery, here are some recommendations in order to help you during your recovery period.  Colace (docusate) - Pick up an over-the-counter form of Colace or another stool softener and take twice a day as long as you are requiring postoperative pain medications.  Take with a full glass of water daily.  If you experience loose stools or diarrhea, hold the colace until you stool forms back up. If your symptoms do not get better within 1 week or if they get worse, check with your doctor. Dulcolax (bisacodyl) - Pick up over-the-counter and take as directed by the product packaging as needed to assist with the movement of your bowels.  Take with a full glass of water.  Use this product as needed if not relieved by Colace only.  MiraLax (polyethylene glycol) - Pick up over-the-counter to have on hand. MiraLax is a solution that will increase the amount of water in your bowels to assist with bowel movements.  Take as directed and can mix with a glass of water, juice, soda, coffee, or tea. Take if you go more than two days without a movement. Do not use MiraLax more than once per day. Call your doctor if you are still constipated or irregular after using this medication for 7 days in a row.  If you continue to have problems with postoperative constipation,  please contact the office for further assistance and recommendations.  If you experience "the worst abdominal pain ever" or develop nausea or vomiting, please contact the office immediatly for further recommendations for treatment.  ITCHING If you experience itching with your medications, try taking only a single pain pill, or even half a pain pill at a time.  You can also use Benadryl over the counter for itching or also to help with sleep.   MEDICATIONS See your medication summary on the "After Visit Summary" that the nursing staff will review with you prior to discharge.  You may have some home medications which will be placed on hold until you complete the course of blood thinner medication.  It is important for you to complete the blood thinner medication as prescribed by your surgeon.  Continue your approved medications as instructed at time of discharge.  PRECAUTIONS If you experience chest pain or shortness of breath - call 911 immediately for transfer to the hospital emergency department.  If you develop a fever greater that 101 F, purulent drainage from wound, increased redness   or drainage from wound, foul odor from the wound/dressing, or calf pain - CONTACT YOUR SURGEON.                                                   FOLLOW-UP APPOINTMENTS Make sure you keep all of your appointments after your operation with your surgeon and caregivers. You should call the office at the above phone number and make an appointment for approximately two weeks after the date of your surgery or on the date instructed by your surgeon outlined in the "After Visit Summary".  RANGE OF MOTION AND STRENGTHENING EXERCISES  Rehabilitation of the knee is important following a knee injury or an operation. After just a few days of immobilization, the muscles of the thigh which control the knee become weakened and shrink (atrophy). Knee exercises are designed to build up the tone and strength of the thigh muscles and to  improve knee motion. Often times heat used for twenty to thirty minutes before working out will loosen up your tissues and help with improving the range of motion but do not use heat for the first two weeks following surgery. These exercises can be done on a training (exercise) mat, on the floor, on a table or on a bed. Use what ever works the best and is most comfortable for you Knee exercises include:  Leg Lifts - While your knee is still immobilized in a splint or cast, you can do straight leg raises. Lift the leg to 60 degrees, hold for 3 sec, and slowly lower the leg. Repeat 10-20 times 2-3 times daily. Perform this exercise against resistance later as your knee gets better.  Quad and Hamstring Sets - Tighten up the muscle on the front of the thigh (Quad) and hold for 5-10 sec. Repeat this 10-20 times hourly. Hamstring sets are done by pushing the foot backward against an object and holding for 5-10 sec. Repeat as with quad sets.  Leg Slides: Lying on your back, slowly slide your foot toward your buttocks, bending your knee up off the floor (only go as far as is comfortable). Then slowly slide your foot back down until your leg is flat on the floor again. Angel Wings: Lying on your back spread your legs to the side as far apart as you can without causing discomfort.  A rehabilitation program following serious knee injuries can speed recovery and prevent re-injury in the future due to weakened muscles. Contact your doctor or a physical therapist for more information on knee rehabilitation.   POST-OPERATIVE OPIOID TAPER INSTRUCTIONS: It is important to wean off of your opioid medication as soon as possible. If you do not need pain medication after your surgery it is ok to stop day one. Opioids include: Codeine, Hydrocodone(Norco, Vicodin), Oxycodone(Percocet, oxycontin) and hydromorphone amongst others.  Long term and even short term use of opiods can cause: Increased pain  response Dependence Constipation Depression Respiratory depression And more.  Withdrawal symptoms can include Flu like symptoms Nausea, vomiting And more Techniques to manage these symptoms Hydrate well Eat regular healthy meals Stay active Use relaxation techniques(deep breathing, meditating, yoga) Do Not substitute Alcohol to help with tapering If you have been on opioids for less than two weeks and do not have pain than it is ok to stop all together.  Plan to wean off of opioids This   plan should start within one week post op of your joint replacement. Maintain the same interval or time between taking each dose and first decrease the dose.  Cut the total daily intake of opioids by one tablet each day Next start to increase the time between doses. The last dose that should be eliminated is the evening dose.   IF YOU ARE TRANSFERRED TO A SKILLED REHAB FACILITY If the patient is transferred to a skilled rehab facility following release from the hospital, a list of the current medications will be sent to the facility for the patient to continue.  When discharged from the skilled rehab facility, please have the facility set up the patient's Home Health Physical Therapy prior to being released. Also, the skilled facility will be responsible for providing the patient with their medications at time of release from the facility to include their pain medication, the muscle relaxants, and their blood thinner medication. If the patient is still at the rehab facility at time of the two week follow up appointment, the skilled rehab facility will also need to assist the patient in arranging follow up appointment in our office and any transportation needs.  MAKE SURE YOU:  Understand these instructions.  Get help right away if you are not doing well or get worse.   DENTAL ANTIBIOTICS:  In most cases prophylactic antibiotics for Dental procdeures after total joint surgery are not  necessary.  Exceptions are as follows:  1. History of prior total joint infection  2. Severely immunocompromised (Organ Transplant, cancer chemotherapy, Rheumatoid biologic meds such as Humera)  3. Poorly controlled diabetes (A1C &gt; 8.0, blood glucose over 200)  If you have one of these conditions, contact your surgeon for an antibiotic prescription, prior to your dental procedure.    Pick up stool softner and laxative for home use following surgery while on pain medications. Do not submerge incision under water. Please use good hand washing techniques while changing dressing each day. May shower starting three days after surgery. Please use a clean towel to pat the incision dry following showers. Continue to use ice for pain and swelling after surgery. Do not use any lotions or creams on the incision until instructed by your surgeon.  

## 2020-08-26 NOTE — Anesthesia Preprocedure Evaluation (Addendum)
Anesthesia Evaluation  Patient identified by MRN, date of birth, ID band Patient awake    Reviewed: Allergy & Precautions, NPO status , Patient's Chart, lab work & pertinent test results  History of Anesthesia Complications Negative for: history of anesthetic complications  Airway Mallampati: II  TM Distance: >3 FB Neck ROM: Full    Dental  (+) Edentulous Upper, Missing, Dental Advisory Given   Pulmonary former smoker,  08/22/2020 SARS coronavirus NEG   breath sounds clear to auscultation       Cardiovascular hypertension, Pt. on medications (-) angina Rhythm:Regular Rate:Normal  '11 ECHO:EF 55-60%. LV Wall motion was normal; grade 1 DD, trivial AI, mild-mod MR     Neuro/Psych Depression negative neurological ROS     GI/Hepatic negative GI ROS, Neg liver ROS,   Endo/Other  Hypothyroidism Morbid obesity  Renal/GU negative Renal ROS     Musculoskeletal  (+) Arthritis , Osteoarthritis,    Abdominal (+) + obese,   Peds  Hematology negative hematology ROS (+)   Anesthesia Other Findings   Reproductive/Obstetrics                            Anesthesia Physical Anesthesia Plan  ASA: 3  Anesthesia Plan: Spinal   Post-op Pain Management:  Regional for Post-op pain   Induction:   PONV Risk Score and Plan: 2 and Ondansetron and Treatment may vary due to age or medical condition  Airway Management Planned: Natural Airway and Simple Face Mask  Additional Equipment: None  Intra-op Plan:   Post-operative Plan:   Informed Consent: I have reviewed the patients History and Physical, chart, labs and discussed the procedure including the risks, benefits and alternatives for the proposed anesthesia with the patient or authorized representative who has indicated his/her understanding and acceptance.     Dental advisory given  Plan Discussed with: CRNA and Surgeon  Anesthesia Plan Comments:  (Plan routine monitors, SAB with adductor canal block for post op analgesia)       Anesthesia Quick Evaluation

## 2020-08-26 NOTE — H&P (Signed)
TOTAL KNEE ADMISSION H&P  Patient is being admitted for left total knee arthroplasty.  Subjective:  Chief Complaint: Left knee pain.  HPI: [name], 75 y.o. female has a history of pain and functional disability in the left knee due to arthritis and has failed non-surgical conservative treatments for greater than 12 weeks to include NSAID's and/or analgesics, flexibility and strengthening excercises, and activity modification. Onset of symptoms was gradual, starting  several  years ago with gradually worsening course since that time. The patient noted no past surgery on the left knee.  Patient currently rates pain in the left knee at 6 out of 10 with activity. Patient has worsening of pain with activity and weight bearing and pain that interferes with activities of daily living. Patient has evidence of periarticular osteophytes and joint space narrowing by imaging studies. There is no active infection.  Patient Active Problem List   Diagnosis Date Noted   Pain in thoracic spine 11/14/2019   Pain of left shoulder joint on movement 10/26/2019   Hyperglycemia 07/25/2019   History of total replacement of both hip joints 05/25/2017   Osteoarthritis of left knee 05/25/2017   Gait disorder 08/14/2016   Posterior neck pain 07/16/2015   Left shoulder pain 07/16/2015   Low back pain 01/16/2015   Routine general medical examination at a health care facility 01/14/2015   Upper airway cough syndrome 12/26/2014   Allergic rhinitis 12/26/2010   Hyperlipidemia 11/18/2009   DIZZINESS 11/18/2009   PALPITATIONS, RECURRENT 07/20/2008   Essential hypertension 07/15/2007   Severe obesity (BMI >= 40) (HCC) 12/04/2006   DIVERTICULOSIS, COLON 12/04/2006   Hypothyroidism 11/25/2006   Depression 11/25/2006   PULMONARY NODULE 11/25/2006   POSTMENOPAUSAL STATUS 11/25/2006   Degenerative joint disease of shoulder region 11/25/2006   OSTEOPENIA 11/25/2006   Hyperparathyroidism, primary (HCC) 11/25/2006     Past Medical History:  Diagnosis Date   Allergic rhinitis, cause unspecified 12/26/2010   Cataract    1 eye-? which   DEGENERATIVE JOINT DISEASE 11/25/2006   in knees    DEPRESSION 11/25/2006   DIVERTICULOSIS, COLON 12/04/2006   DIZZINESS 11/18/2009   DJD (degenerative joint disease)    Bilateral Hip   HYPERCHOLESTEROLEMIA 11/25/2006   HYPERLIPIDEMIA 11/18/2009   HYPERPARATHYROIDISM, HX OF 11/25/2006   HYPERTENSION 07/15/2007   HYPOTHYROIDISM 11/25/2006   OBESITY 12/04/2006   OSTEOPENIA 11/25/2006   OTITIS MEDIA, ACUTE, LEFT 11/18/2009   PALPITATIONS, RECURRENT 07/20/2008   POSTMENOPAUSAL STATUS 11/25/2006   PULMONARY NODULE 11/25/2006   Vitamin D deficiency     Past Surgical History:  Procedure Laterality Date   ABDOMINAL HYSTERECTOMY     COLONOSCOPY     left knee     PARATHYROIDECTOMY Right 11/20/2014   Procedure: RIGHT PARATHYROIDECTOMY;  Surgeon: Darnell Levelodd Gerkin, MD;  [facility];  Service: General;  Laterality: Right;   POLYPECTOMY     right wrist     THYROID SURGERY     early 70's   TOTAL HIP ARTHROPLASTY     left 3/05, right 2002   TOTAL HIP ARTHROPLASTY Left 2005   UMBILICAL HERNIA REPAIR      Prior to Admission medications   Medication Sig Start Date End Date Taking? Authorizing Provider  alendronate (FOSAMAX) 70 MG tablet TAKE 1 TABLET ONCE EVERY WEEK (ON SUNDAY MORNINGS) WITH A FULL GLASS OF WATER ON AN EMPTY STOMACH Patient taking differently: Take 70 mg by mouth every Sunday. 07/05/20  Yes Corwin LevinsJohn, James W, MD  aspirin 81 MG EC tablet Take  81 mg by mouth daily.   Yes [provider]  cholecalciferol (VITAMIN D) 1000 UNITS tablet Take 1,000 Units by mouth daily.   Yes [provider]  levothyroxine (SYNTHROID) 75 MCG tablet TAKE 1 TABLET EVERY DAY Patient taking differently: Take 75 mcg by mouth daily before breakfast. 07/09/20  Yes Corwin Levins, MD  lovastatin (MEVACOR) 20 MG tablet TAKE 1 TABLET EVERY DAY Patient taking differently:  Take 20 mg by mouth daily. 07/30/20  Yes Corwin Levins, MD  naproxen sodium (ALEVE) 220 MG tablet Take 440 mg by mouth daily as needed (pain).    Yes [provider]  OVER THE COUNTER MEDICATION Place 1 drop into both eyes daily as needed (dry eyes). Over the counter lubricating eye drops   Yes [provider]  psyllium (HYDROCIL/METAMUCIL) 95 % PACK Take 1 packet by mouth daily as needed (constipation).    Yes [provider]  valsartan (DIOVAN) 80 MG tablet TAKE 1 TABLET EVERY DAY Patient taking differently: Take 80 mg by mouth daily. 06/28/20  Yes Corwin Levins, MD    No Known Allergies  Social History   Socioeconomic History   Marital status: Widowed    Spouse name: Not on file   Number of children: 2   Years of education: Not on file   Highest education level: Not on file  Occupational History   Occupation: retired  Tobacco Use   Smoking status: Former    Years: 1.00    Pack years: 0.00    Types: Cigarettes    Quit date: 02/17/1968    Years since quitting: 52.5   Smokeless tobacco: Former    Quit date: 11/12/1974   Tobacco comments:    tried snuff as a child, casual smoker as young adult  Vaping Use   Vaping Use: Never used  Substance and Sexual Activity   Alcohol use: No    Alcohol/week: 0.0 standard drinks   Drug use: No   Sexual activity: Never  Other Topics Concern   Not on file  Social History Narrative   Not on file   Social Determinants of Health   Financial Resource Strain: Low Risk    Difficulty of Paying Living Expenses: Not hard at all  Food Insecurity: No Food Insecurity   Worried About Programme researcher, broadcasting/film/video in the Last Year: Never true   Ran Out of Food in the Last Year: Never true  Transportation Needs: No Transportation Needs   Lack of Transportation (Medical): No   Lack of Transportation (Non-Medical): No  Physical Activity: Inactive   Days of Exercise per Week: 0 days   Minutes of Exercise per Session: 0 min  Stress: No  Stress Concern Present   Feeling of Stress : Not at all  Social Connections: Moderately Integrated   Frequency of Communication with Friends and Family: More than three times a week   Frequency of Social Gatherings with Friends and Family: More than three times a week   Attends Religious Services: More than 4 times per year   Active Member of Clubs or Organizations: No   Attends Engineer, structural: More than 4 times per year   Marital Status: Widowed  Catering manager Violence: Not on file    Tobacco Use: Medium Risk   Smoking Tobacco Use: Former   Smokeless Tobacco Use: Former   Social History   Substance and Sexual Activity  Alcohol Use No   Alcohol/week: 0.0 standard drinks    Family  History  Problem Relation Age of Onset   Breast cancer Mother    Stroke Father    Hypertension Other    Colon cancer Neg Hx    Esophageal cancer Neg Hx    Rectal cancer Neg Hx    Stomach cancer Neg Hx     ROS: Constitutional: no fever, no chills, no night sweats, no significant weight loss Cardiovascular: no chest pain, no palpitations Respiratory: no cough, no shortness of breath, No COPD Gastrointestinal: no vomiting, no nausea Musculoskeletal: no swelling in Joints, Joint Pain Neurologic: no numbness, no tingling, no difficulty with balance   Objective:  Physical Exam: Well nourished and well developed.  General: Alert and oriented x3, cooperative and pleasant, no acute distress.  Head: normocephalic, atraumatic, neck supple.  Eyes: EOMI.  Respiratory: breath sounds clear in all fields, no wheezing, rales, or rhonchi. Cardiovascular: Regular rate and rhythm, no murmurs, gallops or rubs.  Abdomen: non-tender to palpation and soft, normoactive bowel sounds. Musculoskeletal:  Significantly antalgic gait pattern favoring the left side with the use of a walker.    Right Knee Exam:  Slight valgus deformity.  No effusion.  Range of motion is 0-115 degrees.  No  crepitus on range of motion of the knee.  No medial or lateral joint line tenderness.  Stable knee.    Left Knee Exam:  No effusion.  Range of motion is 0-100 degrees.  She cannot flex beyond 100 degrees.  No crepitus on range of motion of the knee.  Positive medial joint line tenderness.  No lateral joint line tenderness.  Stable knee.    Sensation and motor function intact in lower extremity. Distal pulses 2+. Calves soft and nontender.          Vital signs in last 24 hours:    Imaging Review Radiographs- AP and lateral of the bilateral knees demonstrate severe tricompartmental bone-on-bone arthritis in the left knee. The right knee does show lateral compartment bone-on-bone arthritis.   Assessment/Plan:  End stage arthritis, left knee   The patient history, physical examination, clinical judgment of the provider and imaging studies are consistent with end stage degenerative joint disease of the left knee and total knee arthroplasty is deemed medically necessary. The treatment options including medical management, injection therapy arthroscopy and arthroplasty were discussed at length. The risks and benefits of total knee arthroplasty were presented and reviewed. The risks due to aseptic loosening, infection, stiffness, patella tracking problems, thromboembolic complications and other imponderables were discussed. The patient acknowledged the explanation, agreed to proceed with the plan and consent was signed. Patient is being admitted for inpatient treatment for surgery, pain control, PT, OT, prophylactic antibiotics, VTE prophylaxis, progressive ambulation and ADLs and discharge planning. The patient is planning to be discharged  home .   Patient's anticipated LOS is less than 2 midnights, meeting these requirements: - Younger than 86 - Lives within 1 hour of care - Has a competent adult at home to recover with post-op recover - NO history of  - Chronic pain requiring  opiods  - Diabetes  - Coronary Artery Disease  - Heart failure  - Heart attack  - Stroke  - DVT/VTE  - Cardiac arrhythmia  - Respiratory Failure/COPD  - Renal failure  - Anemia  - Advanced Liver disease    Therapy Plans: [facility] Disposition: Home with daughter Planned DVT Prophylaxis: Aspirin 325mg  DME Needed: None PCP: , MD (clearance received) TXA: IV Allergies: None Anesthesia Concerns: n/a BMI: 39.4 Last HgbA1c:  None  Pharmacy: [facility] on [address]   - Patient was instructed on what medications to stop prior to surgery. - Follow-up visit in 2 weeks with Dr. Lequita Halt - Begin physical therapy following surgery - Pre-operative lab work as pre-surgical testing - Prescriptions will be provided in hospital at time of discharge  Nelia Shi, [facility], PA-C Orthopedic Surgery [facility] [place]

## 2020-08-26 NOTE — Anesthesia Postprocedure Evaluation (Signed)
Anesthesia Post Note  Patient: [name]  Procedure(s) Performed: TOTAL KNEE ARTHROPLASTY (Left: Knee)     Patient location during evaluation: PACU Anesthesia Type: General Level of consciousness: awake and alert, patient cooperative and oriented Pain management: pain level controlled Vital Signs Assessment: post-procedure vital signs reviewed and stable Respiratory status: spontaneous breathing, nonlabored ventilation and respiratory function stable Cardiovascular status: blood pressure returned to baseline and stable Postop Assessment: no apparent nausea or vomiting Anesthetic complications: no   No notable events documented.  Last Vitals:  Vitals:   08/26/20 1311 08/26/20 1526  BP: 135/77 133/73  Pulse: 82 81  Resp: 18 17  Temp: 36.8 C 37.2 C  SpO2: 97% 98%    Last Pain:  Vitals:   08/26/20 1526  TempSrc: Oral  PainSc:                  [name],[name]

## 2020-08-26 NOTE — Care Plan (Signed)
Ortho Bundle Case Management Note  Patient Details  Name: [name] MRN: [id number] Date of Birth: February 11, 1946  L TKA on 08-26-20 DCP:  Home with dtr.  1 story home with 0 ste. DME:  No needs.  Has a RW and 3-in-1. PT:  [facility].  PT eval scheduled on 08-29-20.                   DME Arranged:  N/A DME Agency:  NA  HH Arranged:  NA HH Agency:  NA  Additional Comments: Please contact me with any questions of if this plan should need to change.  Ennis Forts, RN,CCM [facility]  [phone] 08/26/2020, 9:15 AM

## 2020-08-26 NOTE — Evaluation (Signed)
Physical Therapy Evaluation Patient Details Name: [name] MRN: [id number] DOB: August 22, 1945 Today's Date: 08/26/2020   History of Present Illness  s/p L TKA.PMH: bil THA  Clinical Impression  Pt is s/p TKA resulting in the deficits listed below (see PT Problem List).  Pt able to get to OOB to chair, incr time needed d/t anxiety with regard to mobility, only min assist overall to take steps bed to chair.  Pt will benefit from skilled PT to increase their independence and safety with mobility to allow discharge to the venue listed below.      Follow Up Recommendations Follow surgeon's recommendation for DC plan and follow-up therapies    Equipment Recommendations  None recommended by PT    Recommendations for Other Services       Precautions / Restrictions Precautions Precautions: Fall;Knee Restrictions Weight Bearing Restrictions: No      Mobility  Bed Mobility Overal bed mobility: Needs Assistance Bed Mobility: Supine to Sit     Supine to sit: Min assist     General bed mobility comments: assist with LLE, incr time    Transfers Overall transfer level: Needs assistance Equipment used: Rolling walker (2 wheeled) Transfers: Sit to/from [employer] Sit to Stand: Min assist Stand pivot transfers: Min assist       General transfer comment: cues for hand placement, and LLE position  Ambulation/Gait                Stairs            Wheelchair Mobility    Modified Rankin (Stroke Patients Only)       Balance                                             Pertinent Vitals/Pain Pain Assessment: 0-10 Pain Score: 4  Pain Location: L knee Pain Descriptors / Indicators: Grimacing;Sore Pain Intervention(s): Limited activity within patient's tolerance;Monitored during session;Premedicated before session;Repositioned    Home Living Family/patient expects to be discharged to:: Private residence Living  Arrangements: Alone   Type of Home: House Home Access: Level entry     Home Layout: One level Home Equipment: None      Prior Function Level of Independence: Independent               Hand Dominance        Extremity/Trunk Assessment   Upper Extremity Assessment Upper Extremity Assessment: Overall WFL for tasks assessed    Lower Extremity Assessment Lower Extremity Assessment: LLE deficits/detail LLE Deficits / Details: ankle WFL,  knee extension and  hip flexion 2+/5       Communication   Communication: No difficulties  Cognition Arousal/Alertness: Awake/alert Behavior During Therapy: Anxious Overall Cognitive Status: Within Functional Limits for tasks assessed                                        General Comments      Exercises     Assessment/Plan    PT Assessment Patient needs continued PT services  PT Problem List Decreased strength;Decreased activity tolerance;Decreased mobility;Decreased knowledge of use of DME;Decreased range of motion;Decreased balance;Pain       PT Treatment Interventions DME instruction;Therapeutic activities;Gait training;Therapeutic exercise;Patient/family education;Balance training;Functional mobility training    PT Goals (Current  goals can be found in the Care Plan section)  Acute Rehab PT Goals Patient Stated Goal: home PT Goal Formulation: With patient Time For Goal Achievement: 09/02/20 Potential to Achieve Goals: Good    Frequency 7X/week   Barriers to discharge        Co-evaluation               AM-PAC PT "6 Clicks" Mobility  Outcome Measure Help needed turning from your back to your side while in a flat bed without using bedrails?: A Little Help needed moving from lying on your back to sitting on the side of a flat bed without using bedrails?: A Little Help needed moving to and from a bed to a chair (including a wheelchair)?: A Little Help needed standing up from a chair using  your arms (e.g., wheelchair or bedside chair)?: A Little Help needed to walk in hospital room?: A Little Help needed climbing 3-5 steps with a railing? : A Little 6 Click Score: 18    End of Session Equipment Utilized During Treatment: Gait belt Activity Tolerance: Other (comment) Patient left: with call bell/phone within reach;with chair alarm set;in chair Nurse Communication: Mobility status PT Visit Diagnosis: Other abnormalities of gait and mobility (R26.89);Difficulty in walking, not elsewhere classified (R26.2)    Time: 1530-1601 PT Time Calculation (min) (ACUTE ONLY): 31 min   Charges:   PT Evaluation $PT Eval Low Complexity: 1 Low PT Treatments $Therapeutic Activity: 8-22 mins        Delice Bison, PT  Acute Rehab Dept ([facility]) [phone] Pager [phone]  08/26/2020   [facility] 08/26/2020, 4:18 PM

## 2020-08-26 NOTE — Plan of Care (Signed)
Plan of care reviewed and discussed with the patient. 

## 2020-08-27 ENCOUNTER — Encounter (HOSPITAL_COMMUNITY): Payer: Self-pay | Admitting: Orthopedic Surgery

## 2020-08-27 DIAGNOSIS — I10 Essential (primary) hypertension: Secondary | ICD-10-CM | POA: Diagnosis not present

## 2020-08-27 DIAGNOSIS — E039 Hypothyroidism, unspecified: Secondary | ICD-10-CM | POA: Diagnosis not present

## 2020-08-27 DIAGNOSIS — M1712 Unilateral primary osteoarthritis, left knee: Secondary | ICD-10-CM | POA: Diagnosis not present

## 2020-08-27 DIAGNOSIS — Z87891 Personal history of nicotine dependence: Secondary | ICD-10-CM | POA: Diagnosis not present

## 2020-08-27 DIAGNOSIS — Z79899 Other long term (current) drug therapy: Secondary | ICD-10-CM | POA: Diagnosis not present

## 2020-08-27 DIAGNOSIS — Z7982 Long term (current) use of aspirin: Secondary | ICD-10-CM | POA: Diagnosis not present

## 2020-08-27 DIAGNOSIS — Z96642 Presence of left artificial hip joint: Secondary | ICD-10-CM | POA: Diagnosis not present

## 2020-08-27 LAB — CBC
HCT: 34.2 % — ABNORMAL LOW (ref 36.0–46.0)
Hemoglobin: 11 g/dL — ABNORMAL LOW (ref 12.0–15.0)
MCH: 29.4 pg (ref 26.0–34.0)
MCHC: 32.2 g/dL (ref 30.0–36.0)
MCV: 91.4 fL (ref 80.0–100.0)
Platelets: 202 10*3/uL (ref 150–400)
RBC: 3.74 MIL/uL — ABNORMAL LOW (ref 3.87–5.11)
RDW: 13.3 % (ref 11.5–15.5)
WBC: 7.4 10*3/uL (ref 4.0–10.5)
nRBC: 0 % (ref 0.0–0.2)

## 2020-08-27 LAB — BASIC METABOLIC PANEL
Anion gap: 6 (ref 5–15)
BUN: 13 mg/dL (ref 8–23)
CO2: 27 mmol/L (ref 22–32)
Calcium: 8.6 mg/dL — ABNORMAL LOW (ref 8.9–10.3)
Chloride: 110 mmol/L (ref 98–111)
Creatinine, Ser: 0.7 mg/dL (ref 0.44–1.00)
GFR, Estimated: >60 mL/min (ref >60)
Glucose, Bld: 114 mg/dL — ABNORMAL HIGH (ref 70–99)
Potassium: 3.8 mmol/L (ref 3.5–5.1)
Sodium: 143 mmol/L (ref 135–145)

## 2020-08-27 MED ORDER — TRAMADOL HCL 50 MG PO TABS: 50.0000 mg | ORAL_TABLET | Freq: Four times a day (QID) | ORAL | 0 refills | Status: DC | PRN

## 2020-08-27 MED ORDER — OXYCODONE HCL 5 MG PO TABS: 5.0000 mg | ORAL_TABLET | Freq: Four times a day (QID) | ORAL | 0 refills | Status: DC | PRN

## 2020-08-27 MED ORDER — ASPIRIN 325 MG PO TBEC: 325.0000 mg | DELAYED_RELEASE_TABLET | Freq: Two times a day (BID) | ORAL | 0 refills | Status: DC

## 2020-08-27 MED ORDER — METHOCARBAMOL 500 MG PO TABS: 500.0000 mg | ORAL_TABLET | Freq: Four times a day (QID) | ORAL | 0 refills | Status: DC | PRN

## 2020-08-27 NOTE — Progress Notes (Signed)
Subjective: 1 Day Post-Op Procedure(s) (LRB): TOTAL KNEE ARTHROPLASTY (Left) Patient reports pain as mild.   Patient seen in rounds by Dr. Lequita Halt. Patient is well, and has had no acute complaints or problems. Denies SOB, chest pain, or calf pain. No acute overnight events. Will continue therapy today.   We will continue therapy today.   Objective: Vital signs in last 24 hours: Temp:  [97.7 F (36.5 C)-99 F (37.2 C)] 99 F (37.2 C) (07/12 0551) Pulse Rate:  [68-96] 96 (07/12 0551) Resp:  [14-22] 17 (07/12 0551) BP: (114-147)/(62-103) 114/83 (07/12 0551) SpO2:  [93 %-100 %] 99 % (07/12 0551) Weight:  [82.1 kg] 82.1 kg (07/11 1311)  Intake/Output from previous day:  Intake/Output Summary (Last 24 hours) at 08/27/2020 0719 Last data filed at 08/27/2020 0600 Gross per 24 hour  Intake 2060.55 ml  Output 1200 ml  Net 860.55 ml     Intake/Output this shift: No intake/output data recorded.  Labs: Recent Labs    08/27/20 0326  HGB 11.0*   Recent Labs    08/27/20 0326  WBC 7.4  RBC 3.74*  HCT 34.2*  PLT 202   Recent Labs    08/27/20 0326  NA 143  K 3.8  CL 110  CO2 27  BUN 13  CREATININE 0.70  GLUCOSE 114*  CALCIUM 8.6*   No results for input(s): LABPT, INR in the last 72 hours.  Exam: General - Patient is Alert and Oriented Extremity - Neurologically intact Neurovascular intact Intact pulses distally Dorsiflexion/Plantar flexion intact Dressing - dressing C/D/I Motor Function - intact, moving foot and toes well on exam.   Past Medical History:  Diagnosis Date   Allergic rhinitis, cause unspecified 12/26/2010   Cataract    1 eye-? which   DEGENERATIVE JOINT DISEASE 11/25/2006   in knees    DEPRESSION 11/25/2006   DIVERTICULOSIS, COLON 12/04/2006   DIZZINESS 11/18/2009   DJD (degenerative joint disease)    Bilateral Hip   HYPERCHOLESTEROLEMIA 11/25/2006   HYPERLIPIDEMIA 11/18/2009   HYPERPARATHYROIDISM, HX OF 11/25/2006   HYPERTENSION  07/15/2007   HYPOTHYROIDISM 11/25/2006   OBESITY 12/04/2006   OSTEOPENIA 11/25/2006   OTITIS MEDIA, ACUTE, LEFT 11/18/2009   PALPITATIONS, RECURRENT 07/20/2008   POSTMENOPAUSAL STATUS 11/25/2006   PULMONARY NODULE 11/25/2006   Vitamin D deficiency     Assessment/Plan: 1 Day Post-Op Procedure(s) (LRB): TOTAL KNEE ARTHROPLASTY (Left) Principal Problem:   OA (osteoarthritis) of knee Active Problems:   Primary osteoarthritis of left knee  Estimated body mass index is 36.56 kg/m as calculated from the following:   Height as of this encounter: 4\' 11"  (1.499 m).   Weight as of this encounter: 82.1 kg. Up with therapy   Patient's anticipated LOS is less than 2 midnights, meeting these requirements: - Lives within 1 hour of care - Has a competent adult at home to recover with post-op - NO history of  - Chronic pain requiring opioids  - Diabetes  - Coronary Artery Disease  - Heart failure  - Heart attack  - Stroke  - DVT/VTE  - Cardiac arrhythmia  - Respiratory Failure/COPD  - Renal failure  - Anemia  - Advanced Liver disease     DVT Prophylaxis - Aspirin and TED hose Weight bearing as tolerated. Continue therapy.  Plan is to go Home after hospital stay.  Plan for two sessions with PT this morning, and if meeting goals, will plan for discharge this afternoon.   Patient to follow up in two weeks  with Dr. Lequita Halt in clinic.   The PDMP database was reviewed today prior to any opioid medications being prescribed to this patient.Nelia Shi, MBA, PA-C Orthopedic Surgery 08/27/2020, 7:19 AM

## 2020-08-27 NOTE — Progress Notes (Signed)
Physical Therapy Treatment Patient Details Name: [name] MRN: [id number] DOB: 1946/02/05 Today's Date: 08/27/2020    History of Present Illness s/p L TKA.PMH: bil THA    PT Comments    Pt progressing with gait and overall mobility.  Will see for second session this afternoon. Pt may be able to d/c later today, reports she will have 24/7 assist from her dtrs and she will likely need hands on assistance for a few days for safety. She is very motivated to d/c today if possible  Follow Up Recommendations  Follow surgeon's recommendation for DC plan and follow-up therapies     Equipment Recommendations  None recommended by PT    Recommendations for Other Services       Precautions / Restrictions Precautions Precautions: Fall;Knee Required Braces or Orthoses: Knee Immobilizer - Left Knee Immobilizer - Left: Discontinue once straight leg raise with < 10 degree lag Restrictions Weight Bearing Restrictions: No Other Position/Activity Restrictions: WBAT    Mobility  Bed Mobility               General bed mobility comments: in recliner on arrival    Transfers Overall transfer level: Needs assistance Equipment used: Rolling walker (2 wheeled) Transfers: Sit to/from Stand Sit to Stand: Min assist;Mod assist         General transfer comment: cues for hand placement, and LLE position, incr time. assist to rise and transition to RW  Ambulation/Gait Ambulation/Gait assistance: Min assist Gait Distance (Feet): 40 Feet Assistive device: Rolling walker (2 wheeled) Gait Pattern/deviations: Step-to pattern;Decreased weight shift to left Gait velocity: decr   General Gait Details: cues for RW position from self, sequence and posture   Stairs             Wheelchair Mobility    Modified Rankin (Stroke Patients Only)       Balance                                            Cognition Arousal/Alertness: Awake/alert Behavior During  Therapy: WFL for tasks assessed/performed Overall Cognitive Status: Within Functional Limits for tasks assessed                                        Exercises Total Joint Exercises Ankle Circles/Pumps: AROM;Both;10 reps    General Comments        Pertinent Vitals/Pain Pain Assessment: 0-10 Pain Score: 4  Pain Location: L knee Pain Descriptors / Indicators: Grimacing;Sore Pain Intervention(s): Monitored during session;Limited activity within patient's tolerance;Premedicated before session;Repositioned    Home Living                      Prior Function            PT Goals (current goals can now be found in the care plan section) Acute Rehab PT Goals Patient Stated Goal: home PT Goal Formulation: With patient Time For Goal Achievement: 09/02/20 Potential to Achieve Goals: Good Progress towards PT goals: Progressing toward goals    Frequency    7X/week      PT Plan Current plan remains appropriate    Co-evaluation              AM-PAC PT "6 Clicks" Mobility   Outcome Measure  Help needed turning from your back to your side while in a flat bed without using bedrails?: A Little Help needed moving from lying on your back to sitting on the side of a flat bed without using bedrails?: A Little Help needed moving to and from a bed to a chair (including a wheelchair)?: A Little Help needed standing up from a chair using your arms (e.g., wheelchair or bedside chair)?: A Little Help needed to walk in hospital room?: A Little Help needed climbing 3-5 steps with a railing? : A Lot 6 Click Score: 17    End of Session Equipment Utilized During Treatment: Gait belt;Left knee immobilizer Activity Tolerance: Patient tolerated treatment well Patient left: with call bell/phone within reach;with chair alarm set;in chair Nurse Communication: Mobility status PT Visit Diagnosis: Other abnormalities of gait and mobility (R26.89);Difficulty in walking,  not elsewhere classified (R26.2)     Time: 6160-7371 PT Time Calculation (min) (ACUTE ONLY): 20 min  Charges:  $Gait Training: 8-22 mins                     Delice Bison, PT  Acute Rehab Dept ([facility]) [phone] Pager [phone]  08/27/2020    [facility] 08/27/2020, 12:05 PM

## 2020-08-27 NOTE — Progress Notes (Signed)
Physical Therapy Treatment Patient Details Name: [name] MRN: [id number] DOB: [birth year] Today's Date: 08/27/2020    History of Present Illness (P) s/p L TKA.PMH: bil THA    PT Comments    Pt continues to progress. Ready to d/c with family assist from PT standpoint.  Reviewed areas below, use of ice and KI  Follow Up Recommendations  (P) Follow surgeon's recommendation for DC plan and follow-up therapies     Equipment Recommendations  (P) None recommended by PT    Recommendations for Other Services       Precautions / Restrictions Precautions Precautions: (P) Fall;Knee Required Braces or Orthoses: (P) Knee Immobilizer - Left Knee Immobilizer - Left: (P) Discontinue once straight leg raise with < 10 degree lag Restrictions Weight Bearing Restrictions: (P) No Other Position/Activity Restrictions: (P) WBAT    Mobility  Bed Mobility               General bed mobility comments: (P) in recliner on arrival    Transfers Overall transfer level: (P) Needs assistance Equipment used: (P) Rolling walker (2 wheeled) Transfers: (P) Sit to/from Stand Sit to Stand: (P) Min assist         General transfer comment: (P) cues for hand placement, and LLE position, incr time. assist to rise and transition to RW  Ambulation/Gait Ambulation/Gait assistance: (P) Min guard Gait Distance (Feet): (P) 65 Feet Assistive device: (P) Rolling walker (2 wheeled) Gait Pattern/deviations: (P) Step-to pattern;Decreased weight shift to left Gait velocity: (P) decr   General Gait Details: (P) cues for RW position from self, sequence and posture. min/guard to close supervision for safety   Stairs             Wheelchair Mobility    Modified Rankin (Stroke Patients Only)       Balance                                            Cognition Arousal/Alertness: (P) Awake/alert Behavior During Therapy: (P) WFL for tasks assessed/performed Overall Cognitive  Status: (P) Within Functional Limits for tasks assessed                                        Exercises Total Joint Exercises Ankle Circles/Pumps: (P) AROM;Both;10 reps Quad Sets: (P) AROM;Both;10 reps Heel Slides: (P) AAROM;Left;10 reps Straight Leg Raises: (P) AAROM;Left;5 reps    General Comments        Pertinent Vitals/Pain Pain Assessment: (P) 0-10 Pain Score: (P) 4  Pain Location: (P) L knee Pain Descriptors / Indicators: (P) Grimacing;Sore Pain Intervention(s): (P) Limited activity within patient's tolerance;Monitored during session;Premedicated before session;Repositioned    Home Living                      Prior Function            PT Goals (current goals can now be found in the care plan section) Acute Rehab PT Goals Patient Stated Goal: (P) home PT Goal Formulation: (P) With patient Time For Goal Achievement: (P) 09/02/20 Potential to Achieve Goals: (P) Good Progress towards PT goals: (P) Progressing toward goals    Frequency    (P) 7X/week      PT Plan (P) Current plan remains appropriate  Co-evaluation              AM-PAC PT "6 Clicks" Mobility   Outcome Measure  Help needed turning from your back to your side while in a flat bed without using bedrails?: (P) A Little Help needed moving from lying on your back to sitting on the side of a flat bed without using bedrails?: (P) A Little Help needed moving to and from a bed to a chair (including a wheelchair)?: (P) A Little Help needed standing up from a chair using your arms (e.g., wheelchair or bedside chair)?: (P) A Little Help needed to walk in hospital room?: (P) A Little Help needed climbing 3-5 steps with a railing? : (P) A Lot 6 Click Score: (P) 17    End of Session Equipment Utilized During Treatment: (P) Gait belt;Left knee immobilizer Activity Tolerance: (P) Patient tolerated treatment well Patient left: (P) with call bell/phone within reach;with chair  alarm set;in chair Nurse Communication: (P) Mobility status PT Visit Diagnosis: (P) Other abnormalities of gait and mobility (R26.89);Difficulty in walking, not elsewhere classified (R26.2)     Time: (P) 1346-(P) 1409 PT Time Calculation (min) (ACUTE ONLY): (P) 23 min  Charges:  $Gait Training: (P) 8-22 mins $Therapeutic Exercise: (P) 8-22 mins                     [name], PT  Acute Rehab Dept ([facility]) [phone] Pager [phone]  08/27/2020    [facility] 08/27/2020, 2:14 PM

## 2020-08-27 NOTE — TOC Transition Note (Signed)
Transition of Care [facility]) - CM/SW Discharge Note  Patient Details  Name: [name] MRN: [id number] Date of Birth: 12-Feb-1946  Transition of Care [facility]) CM/SW Contact:  Sherie Don, LCSW Phone Number: 08/27/2020, 10:13 AM  Clinical Narrative: Patient is expected to discharge after working with PT. CSW met with patient to confirm discharge plan. Patient will go to [facility] for OPPT with an appointment scheduled for 08/29/20. Patient has a rolling walker and 3N1 at home, so there are no DME needs at this time. TOC signing off.  Final next level of care: OP Rehab Barriers to Discharge: No Barriers Identified  Patient Goals and CMS Choice Patient states their goals for this hospitalization and ongoing recovery are:: Discharge home with OPPT through [facility] CMS Medicare.gov Compare Post Acute Care list provided to:: Patient Choice offered to / list presented to : NA  Discharge Plan and Services        DME Arranged: N/A DME Agency: NA HH Arranged: NA [place] Agency: NA  Readmission Risk Interventions No flowsheet data found.

## 2020-08-29 DIAGNOSIS — M25562 Pain in left knee: Secondary | ICD-10-CM | POA: Diagnosis not present

## 2020-09-02 DIAGNOSIS — M25562 Pain in left knee: Secondary | ICD-10-CM | POA: Diagnosis not present

## 2020-09-04 DIAGNOSIS — M25562 Pain in left knee: Secondary | ICD-10-CM | POA: Diagnosis not present

## 2020-09-04 NOTE — Discharge Summary (Signed)
Physician Discharge Summary   Patient ID: [name] MRN: [id number] DOB/AGE: 77/06/1945 77 y.o.  Admit date: 08/26/2020 Discharge date: 08/27/2020  Primary Diagnosis: Osteoarthritis of Left Knee  Admission Diagnoses:  Past Medical History:  Diagnosis Date   Allergic rhinitis, cause unspecified 12/26/2010   Cataract    1 eye-? which   DEGENERATIVE JOINT DISEASE 11/25/2006   in knees    DEPRESSION 11/25/2006   DIVERTICULOSIS, COLON 12/04/2006   DIZZINESS 11/18/2009   DJD (degenerative joint disease)    Bilateral Hip   HYPERCHOLESTEROLEMIA 11/25/2006   HYPERLIPIDEMIA 11/18/2009   HYPERPARATHYROIDISM, HX OF 11/25/2006   HYPERTENSION 07/15/2007   HYPOTHYROIDISM 11/25/2006   OBESITY 12/04/2006   OSTEOPENIA 11/25/2006   OTITIS MEDIA, ACUTE, LEFT 11/18/2009   PALPITATIONS, RECURRENT 07/20/2008   POSTMENOPAUSAL STATUS 11/25/2006   PULMONARY NODULE 11/25/2006   Vitamin D deficiency    Discharge Diagnoses:   Principal Problem:   OA (osteoarthritis) of knee Active Problems:   Primary osteoarthritis of left knee  Estimated body mass index is 36.56 kg/m as calculated from the following:   Height as of this encounter: 4\' 11"  (1.499 m).   Weight as of this encounter: 82.1 kg.  Procedure:  Procedure(s) (LRB): TOTAL KNEE ARTHROPLASTY (Left)   Consults: None  HPI: Lori HatterMyrtle M Puccinelli is a 75 y.o. year old female with end stage OA of her left knee with progressively worsening pain and dysfunction. She has constant pain, with activity and at rest and significant functional deficits with difficulties even with ADLs. She has had extensive non-op management including analgesics, injections of cortisone and viscosupplements, and home exercise program, but remains in significant pain with significant dysfunction. Radiographs show bone on bone arthritis medial and patellofemoral. She presents now for left Total Knee Arthroplasty.    Laboratory Data: Admission on 08/26/2020, Discharged on  08/27/2020  Component Date Value Ref Range Status   ABO/RH(D) 08/26/2020    Final                   Value:O POS Performed at [facility], [address] [place], [place] [address]    WBC 08/27/2020 7.4  4.0 - 10.5 K/uL Final   RBC 08/27/2020 3.74 (A) 3.87 - 5.11 MIL/uL Final   Hemoglobin 08/27/2020 11.0 (A) 12.0 - 15.0 g/dL Final   HCT 13/24/401007/01/2021 34.2 (A) 36.0 - 46.0 % Final   MCV 08/27/2020 91.4  80.0 - 100.0 fL Final   MCH 08/27/2020 29.4  26.0 - 34.0 pg Final   MCHC 08/27/2020 32.2  30.0 - 36.0 g/dL Final   RDW 27/25/366407/01/2021 13.3  11.5 - 15.5 % Final   Platelets 08/27/2020 202  150 - 400 K/uL Final   nRBC 08/27/2020 0.0  0.0 - 0.2 % Final   Performed at [facility], [address] [place], [place] [address]   Sodium 08/27/2020 143  135 - 145 mmol/L Final   Potassium 08/27/2020 3.8  3.5 - 5.1 mmol/L Final   Chloride 08/27/2020 110  98 - 111 mmol/L Final   CO2 08/27/2020 27  22 - 32 mmol/L Final   Glucose, Bld 08/27/2020 114 (A) 70 - 99 mg/dL Final   Glucose reference range applies only to samples taken after fasting for at least 8 hours.   BUN 08/27/2020 13  8 - 23 mg/dL Final   Creatinine, Ser 08/27/2020 0.70  0.44 - 1.00 mg/dL Final   Calcium 42/59/563807/01/2021 8.6 (A) 8.9 - 10.3 mg/dL Final   GFR,  Estimated 08/27/2020 >60  >60 mL/min Final   Comment: (NOTE) Calculated using the CKD-EPI Creatinine Equation (2021)    Anion gap 08/27/2020 6  5 - 15 Final   Performed at [facility], [address], [place], [place] [address]  Hospital Outpatient Visit on 08/22/2020  Component Date Value Ref Range Status   SARS Coronavirus 2 08/22/2020 NEGATIVE  NEGATIVE Final   Comment: (NOTE) SARS-CoV-2 target nucleic acids are NOT DETECTED.  The SARS-CoV-2 RNA is generally detectable in upper and lower respiratory specimens during the acute phase of infection. Negative results do not preclude SARS-CoV-2 infection, do not rule  out co-infections with other pathogens, and should not be used as the sole basis for treatment or other patient management decisions. Negative results must be combined with clinical observations, patient history, and epidemiological information. The expected result is Negative.  Fact Sheet for Patients: HairSlick.no  Fact Sheet for Healthcare Providers: quierodirigir.com  This test is not yet approved or cleared by the [place] FDA and  has been authorized for detection and/or diagnosis of SARS-CoV-2 by FDA under an Emergency Use Authorization (EUA). This EUA will remain  in effect (meaning this test can be used) for the duration of the COVID-19 declaration under Se                          ction 564(b)(1) of the Act, 21 U.S.C. section 360bbb-3(b)(1), unless the authorization is terminated or revoked sooner.  Performed at [facility], [address], [place], [place] [address]   Hospital Outpatient Visit on 08/16/2020  Component Date Value Ref Range Status   MRSA, PCR 08/16/2020 NEGATIVE  NEGATIVE Final   Staphylococcus aureus 08/16/2020 NEGATIVE  NEGATIVE Final   Comment: (NOTE) The Xpert SA Assay (FDA approved for NASAL specimens in patients 52 years of age and older), is one component of a comprehensive surveillance program. It is not intended to diagnose infection nor to guide or monitor treatment. Performed at [facility], [address], [place], [place] [address]    WBC 08/16/2020 3.3 (A) 4.0 - 10.5 K/uL Final   RBC 08/16/2020 4.22  3.87 - 5.11 MIL/uL Final   Hemoglobin 08/16/2020 12.4  12.0 - 15.0 g/dL Final   HCT 91/47/8295 39.1  36.0 - 46.0 % Final   MCV 08/16/2020 92.7  80.0 - 100.0 fL Final   MCH 08/16/2020 29.4  26.0 - 34.0 pg Final   MCHC 08/16/2020 31.7  30.0 - 36.0 g/dL Final   RDW 62/13/0865 13.4  11.5 - 15.5 % Final   Platelets 08/16/2020 236  150 - 400 K/uL Final    nRBC 08/16/2020 0.0  0.0 - 0.2 % Final   Performed at [facility], [address] [place], [place] [address]   Sodium 08/16/2020 139  135 - 145 mmol/L Final   Potassium 08/16/2020 4.1  3.5 - 5.1 mmol/L Final   Chloride 08/16/2020 109  98 - 111 mmol/L Final   CO2 08/16/2020 27  22 - 32 mmol/L Final   Glucose, Bld 08/16/2020 89  70 - 99 mg/dL Final   Glucose reference range applies only to samples taken after fasting for at least 8 hours.   BUN 08/16/2020 14  8 - 23 mg/dL Final   Creatinine, Ser 08/16/2020 0.57  0.44 - 1.00 mg/dL Final   Calcium 62/95/2841 8.8 (A) 8.9 - 10.3 mg/dL Final   Total Protein 32/44/0102 6.9  6.5 - 8.1 g/dL Final   Albumin 47/65/4650 3.8  3.5 - 5.0 g/dL Final   AST 35/46/5681 18  15 - 41 U/L Final   ALT 08/16/2020 10  0 - 44 U/L Final   Alkaline Phosphatase 08/16/2020 31 (A) 38 - 126 U/L Final   Total Bilirubin 08/16/2020 0.5  0.3 - 1.2 mg/dL Final   GFR, Estimated 08/16/2020 >60  >60 mL/min Final   Comment: (NOTE) Calculated using the CKD-EPI Creatinine Equation (2021)    Anion gap 08/16/2020 3 (A) 5 - 15 Final   Performed at [facility] [address] [place], [place] [address]   Prothrombin Time 08/16/2020 13.8  11.4 - 15.2 seconds Final   INR 08/16/2020 1.1  0.8 - 1.2 Final   Comment: (NOTE) INR goal varies based on device and disease states. Performed at [facility], [address] [place], [place] [address]    ABO/RH(D) 08/16/2020 O POS   Final   Antibody Screen 08/16/2020 NEG   Final   Sample Expiration 08/16/2020 08/29/2020,2359   Final   Extend sample reason 08/16/2020    Final                   Value:NO TRANSFUSIONS OR PREGNANCY IN THE PAST 3 MONTHS Performed at [facility], [address], [place], [place] [address]      X-Rays:No results found.  EKG: Orders placed or performed during the hospital encounter of 08/16/20   EKG 12 lead per protocol   EKG 12  lead per protocol     Hospital Course: Darionna Banke Marlowe is a 75 y.o. who was admitted to [facility]. They were brought to the operating room on 08/26/2020 and underwent Procedure(s): TOTAL KNEE ARTHROPLASTY.  Patient tolerated the procedure well and was later transferred to the recovery room and then to the orthopaedic floor for postoperative care. They were given PO and IV analgesics for pain control following their surgery. They were given 24 hours of postoperative antibiotics of  Anti-infectives (From admission, onward)    Start     Dose/Rate Route Frequency Ordered Stop   08/26/20 1530  ceFAZolin (ANCEF) 2 g in sodium chloride 0.9 % 100 mL IVPB        2 g 200 mL/hr over 30 Minutes Intravenous Every 6 hours 08/26/20 1320 08/26/20 2259   08/26/20 0745  ceFAZolin (ANCEF) IVPB 2g/100 mL premix        2 g 200 mL/hr over 30 Minutes Intravenous On call to O.R. 08/26/20 [id number] 08/26/20 0924      and started on DVT prophylaxis in the form of Aspirin and TED hose.   PT and OT were ordered for total joint protocol. Discharge planning consulted to help with postop disposition and equipment needs.  Patient had an uneventful night on the evening of surgery. They started to get up OOB with therapy on 08/27/2020. Pt was seen during rounds and was ready to go home pending progress with therapy. She worked with therapy on POD #1 and was meeting goals. Pt was discharged to home later that day in stable condition.  Diet: Regular diet Activity: WBAT Follow-up: in two weeks Disposition: Home Discharged Condition: good   Discharge Instructions     Call MD / Call 911   Complete by: As directed    If you experience chest pain or shortness of breath, CALL 911 and be transported to the hospital emergency room.  If you develope  a fever above 101 F, pus (white drainage) or increased drainage or redness at the wound, or calf pain, call your surgeon's office.   Change dressing   Complete by: As directed     You may remove the bulky bandage (ACE wrap and gauze) two days after surgery. You will have an adhesive waterproof bandage underneath. Leave this in place until your first follow-up appointment.   Constipation Prevention   Complete by: As directed    Drink plenty of fluids.  Prune juice may be helpful.  You may use a stool softener, such as Colace (over the counter) 100 mg twice a day.  Use MiraLax (over the counter) for constipation as needed.   Diet - low sodium heart healthy   Complete by: As directed    Do not put a pillow under the knee. Place it under the heel.   Complete by: As directed    Driving restrictions   Complete by: As directed    No driving for two weeks   Post-operative opioid taper instructions:   Complete by: As directed    POST-OPERATIVE OPIOID TAPER INSTRUCTIONS: It is important to wean off of your opioid medication as soon as possible. If you do not need pain medication after your surgery it is ok to stop day one. Opioids include: Codeine, Hydrocodone(Norco, Vicodin), Oxycodone(Percocet, oxycontin) and hydromorphone amongst others.  Long term and even short term use of opiods can cause: Increased pain response Dependence Constipation Depression Respiratory depression And more.  Withdrawal symptoms can include Flu like symptoms Nausea, vomiting And more Techniques to manage these symptoms Hydrate well Eat regular healthy meals Stay active Use relaxation techniques(deep breathing, meditating, yoga) Do Not substitute Alcohol to help with tapering If you have been on opioids for less than two weeks and do not have pain than it is ok to stop all together.  Plan to wean off of opioids This plan should start within one week post op of your joint replacement. Maintain the same interval or time between taking each dose and first decrease the dose.  Cut the total daily intake of opioids by one tablet each day Next start to increase the time between doses. The last  dose that should be eliminated is the evening dose.      TED hose   Complete by: As directed    Use stockings (TED hose) for three weeks on both leg(s).  You may remove them at night for sleeping.   Weight bearing as tolerated   Complete by: As directed       Allergies as of 08/27/2020   No Known Allergies      Medication List     STOP taking these medications    naproxen sodium 220 MG tablet Commonly known as: ALEVE   OVER THE COUNTER MEDICATION       TAKE these medications    alendronate 70 MG tablet Commonly known as: FOSAMAX TAKE 1 TABLET ONCE EVERY WEEK (ON SUNDAY MORNINGS) WITH A FULL GLASS OF WATER ON AN EMPTY STOMACH What changed: See the new instructions.   aspirin 325 MG EC tablet Take 1 tablet (325 mg total) by mouth 2 (two) times daily. Then take one 81 mg aspirin once a day for three weeks. Then discontinue aspirin. What changed:  medication strength how much to take when to take this additional instructions   cholecalciferol 1000 units tablet Commonly known as: VITAMIN D Take 1,000 Units by mouth daily.   levothyroxine 75 MCG tablet  Commonly known as: SYNTHROID TAKE 1 TABLET EVERY DAY What changed: when to take this   lovastatin 20 MG tablet Commonly known as: MEVACOR TAKE 1 TABLET EVERY DAY   methocarbamol 500 MG tablet Commonly known as: ROBAXIN Take 1 tablet (500 mg total) by mouth every 6 (six) hours as needed for muscle spasms.   oxyCODONE 5 MG immediate release tablet Commonly known as: Oxy IR/ROXICODONE Take 1-2 tablets (5-10 mg total) by mouth every 6 (six) hours as needed for severe pain.   psyllium 95 % Pack Commonly known as: HYDROCIL/METAMUCIL Take 1 packet by mouth daily as needed (constipation).   traMADol 50 MG tablet Commonly known as: ULTRAM Take 1-2 tablets (50-100 mg total) by mouth every 6 (six) hours as needed for moderate pain.   valsartan 80 MG tablet Commonly known as: DIOVAN TAKE 1 TABLET EVERY DAY                Discharge Care Instructions  (From admission, onward)           Start     Ordered   08/27/20 0000  Weight bearing as tolerated        08/27/20 0803   08/27/20 0000  Change dressing       Comments: You may remove the bulky bandage (ACE wrap and gauze) two days after surgery. You will have an adhesive waterproof bandage underneath. Leave this in place until your first follow-up appointment.   08/27/20 0803            Follow-up Information     Ollen Gross, MD Follow up.   Specialty: Orthopedic Surgery Why: You are scheduled for an appointment on 09/10/20 at 1:15 pm Contact information: [address] [place] [address] [place] [address] [phone]                 Signed: Nelia Shi, MBA, PA-C Orthopedic Surgery 09/04/2020, 8:39 AM

## 2020-09-06 DIAGNOSIS — M25562 Pain in left knee: Secondary | ICD-10-CM | POA: Diagnosis not present

## 2020-09-09 ENCOUNTER — Other Ambulatory Visit (HOSPITAL_COMMUNITY): Payer: Self-pay | Admitting: Orthopedic Surgery

## 2020-09-09 ENCOUNTER — Ambulatory Visit (HOSPITAL_COMMUNITY)
Admission: RE | Admit: 2020-09-09 | Discharge: 2020-09-09 | Disposition: A | Discharge status: Home / Self Care | Payer: Medicare HMO | Source: Ambulatory Visit | Attending: Orthopedic Surgery | Admitting: Orthopedic Surgery

## 2020-09-09 ENCOUNTER — Other Ambulatory Visit: Payer: Self-pay

## 2020-09-09 DIAGNOSIS — M7989 Other specified soft tissue disorders: Secondary | ICD-10-CM | POA: Diagnosis not present

## 2020-09-09 DIAGNOSIS — M79662 Pain in left lower leg: Secondary | ICD-10-CM

## 2020-09-09 DIAGNOSIS — M25562 Pain in left knee: Secondary | ICD-10-CM | POA: Diagnosis not present

## 2020-09-09 NOTE — Progress Notes (Signed)
Lower extremity venous LT study completed.  Attempted to call results to Aluisio, MD at 17:20.   See CV Proc for preliminary results report.   Jean Rosenthal, RDMS, RVT

## 2020-09-11 DIAGNOSIS — M25562 Pain in left knee: Secondary | ICD-10-CM | POA: Diagnosis not present

## 2020-09-13 DIAGNOSIS — M25562 Pain in left knee: Secondary | ICD-10-CM | POA: Diagnosis not present

## 2020-09-16 DIAGNOSIS — M25562 Pain in left knee: Secondary | ICD-10-CM | POA: Diagnosis not present

## 2020-09-18 DIAGNOSIS — M25562 Pain in left knee: Secondary | ICD-10-CM | POA: Diagnosis not present

## 2020-09-23 DIAGNOSIS — M25562 Pain in left knee: Secondary | ICD-10-CM | POA: Diagnosis not present

## 2020-09-25 DIAGNOSIS — M25562 Pain in left knee: Secondary | ICD-10-CM | POA: Diagnosis not present

## 2020-10-01 DIAGNOSIS — Z471 Aftercare following joint replacement surgery: Secondary | ICD-10-CM | POA: Diagnosis not present

## 2020-10-01 DIAGNOSIS — Z96652 Presence of left artificial knee joint: Secondary | ICD-10-CM | POA: Diagnosis not present

## 2020-11-08 DIAGNOSIS — H43813 Vitreous degeneration, bilateral: Secondary | ICD-10-CM | POA: Diagnosis not present

## 2020-11-08 DIAGNOSIS — H2513 Age-related nuclear cataract, bilateral: Secondary | ICD-10-CM | POA: Diagnosis not present

## 2020-11-08 DIAGNOSIS — H524 Presbyopia: Secondary | ICD-10-CM | POA: Diagnosis not present

## 2020-12-06 DIAGNOSIS — Z96652 Presence of left artificial knee joint: Secondary | ICD-10-CM | POA: Diagnosis not present

## 2020-12-10 ENCOUNTER — Encounter: Payer: Self-pay | Admitting: Internal Medicine

## 2020-12-10 ENCOUNTER — Other Ambulatory Visit: Payer: Self-pay

## 2020-12-10 ENCOUNTER — Ambulatory Visit (INDEPENDENT_AMBULATORY_CARE_PROVIDER_SITE_OTHER): Payer: Medicare HMO | Admitting: Internal Medicine

## 2020-12-10 VITALS — BP 130/72 | HR 101 | Temp 98.8°F | Ht 59.0 in | Wt 170.4 lb

## 2020-12-10 DIAGNOSIS — I10 Essential (primary) hypertension: Secondary | ICD-10-CM | POA: Diagnosis not present

## 2020-12-10 DIAGNOSIS — R609 Edema, unspecified: Secondary | ICD-10-CM | POA: Diagnosis not present

## 2020-12-10 DIAGNOSIS — R739 Hyperglycemia, unspecified: Secondary | ICD-10-CM | POA: Diagnosis not present

## 2020-12-10 DIAGNOSIS — M79661 Pain in right lower leg: Secondary | ICD-10-CM

## 2020-12-10 DIAGNOSIS — R600 Localized edema: Secondary | ICD-10-CM

## 2020-12-10 DIAGNOSIS — M7989 Other specified soft tissue disorders: Secondary | ICD-10-CM | POA: Diagnosis not present

## 2020-12-10 LAB — CBC WITH DIFFERENTIAL/PLATELET
Basophils Absolute: 0 10*3/uL (ref 0.0–0.1)
Basophils Relative: 0.9 % (ref 0.0–3.0)
Eosinophils Absolute: 0.1 10*3/uL (ref 0.0–0.7)
Eosinophils Relative: 2.1 % (ref 0.0–5.0)
HCT: 37.3 % (ref 36.0–46.0)
Hemoglobin: 12.2 g/dL (ref 12.0–15.0)
Lymphocytes Relative: 17.3 % (ref 12.0–46.0)
Lymphs Abs: 0.6 10*3/uL — ABNORMAL LOW (ref 0.7–4.0)
MCHC: 32.7 g/dL (ref 30.0–36.0)
MCV: 88.3 fl (ref 78.0–100.0)
Monocytes Absolute: 0.4 10*3/uL (ref 0.1–1.0)
Monocytes Relative: 10.7 % (ref 3.0–12.0)
Neutro Abs: 2.6 10*3/uL (ref 1.4–7.7)
Neutrophils Relative %: 69 % (ref 43.0–77.0)
Platelets: 295 10*3/uL (ref 150.0–400.0)
RBC: 4.22 Mil/uL (ref 3.87–5.11)
RDW: 14.1 % (ref 11.5–15.5)
WBC: 3.7 10*3/uL — ABNORMAL LOW (ref 4.0–10.5)

## 2020-12-10 LAB — BRAIN NATRIURETIC PEPTIDE: Pro B Natriuretic peptide (BNP): 28 pg/mL (ref 0.0–100.0)

## 2020-12-10 LAB — HEPATIC FUNCTION PANEL
ALT: 6 U/L (ref 0–35)
AST: 15 U/L (ref 0–37)
Albumin: 4 g/dL (ref 3.5–5.2)
Alkaline Phosphatase: 45 U/L (ref 39–117)
Bilirubin, Direct: 0.1 mg/dL (ref 0.0–0.3)
Total Bilirubin: 0.5 mg/dL (ref 0.2–1.2)
Total Protein: 7.8 g/dL (ref 6.0–8.3)

## 2020-12-10 LAB — HEMOGLOBIN A1C: Hgb A1c MFr Bld: 5.9 % (ref 4.6–6.5)

## 2020-12-10 LAB — BASIC METABOLIC PANEL
BUN: 14 mg/dL (ref 6–23)
CO2: 29 mEq/L (ref 19–32)
Calcium: 9.3 mg/dL (ref 8.4–10.5)
Chloride: 104 mEq/L (ref 96–112)
Creatinine, Ser: 0.67 mg/dL (ref 0.40–1.20)
GFR: 84.37 mL/min (ref >60.00)
Glucose, Bld: 81 mg/dL (ref 70–99)
Potassium: 4.4 mEq/L (ref 3.5–5.1)
Sodium: 140 mEq/L (ref 135–145)

## 2020-12-10 MED ORDER — ASPIRIN 325 MG PO TBEC: 325.0000 mg | DELAYED_RELEASE_TABLET | Freq: Every day | ORAL | 3 refills | Status: AC

## 2020-12-10 MED ORDER — FUROSEMIDE 20 MG PO TABS: 20.0000 mg | ORAL_TABLET | Freq: Every day | ORAL | 3 refills | Status: DC | PRN

## 2020-12-10 NOTE — Progress Notes (Signed)
Chief Complaint: follow up bilateral leg swelling       HPI:  [name] is a 75 y.o. female here with hx of left knee TKR July 2022 with some mild persistent left knee and distal swelling post op, but now with new osnet bilateral right > left worsening bilateal leg swelling and calf tightness with some RLe discomfort, mild to mod, intermittent, better at night with leg elevated, worse in the pm after up all day, and has also a localized post right mid calf area of tender firmness about 1.5 cm oval area;  no taking Asa 81 qd  Pt denies chest pain, increased sob or doe, wheezing, orthopnea, PND, increased LE swelling, palpitations, dizziness or syncope.   Pt denies polydipsia, polyuria, or new focal neuro s/s.   Pt denies fever, wt loss, night sweats, loss of appetite, or other constitutional symptoms        Wt Readings from Last 3 Encounters:  12/10/20 170 lb 6.4 oz (77.3 kg)  08/26/20 181 lb (82.1 kg)  08/16/20 181 lb (82.1 kg)   BP Readings from Last 3 Encounters:  12/10/20 130/72  08/27/20 139/74  08/16/20 138/74         Past Medical History:  Diagnosis Date   Allergic rhinitis, cause unspecified 12/26/2010   Cataract    1 eye-? which   DEGENERATIVE JOINT DISEASE 11/25/2006   in knees    DEPRESSION 11/25/2006   DIVERTICULOSIS, COLON 12/04/2006   DIZZINESS 11/18/2009   DJD (degenerative joint disease)    Bilateral Hip   HYPERCHOLESTEROLEMIA 11/25/2006   HYPERLIPIDEMIA 11/18/2009   HYPERPARATHYROIDISM, HX OF 11/25/2006   HYPERTENSION 07/15/2007   HYPOTHYROIDISM 11/25/2006   OBESITY 12/04/2006   OSTEOPENIA 11/25/2006   OTITIS MEDIA, ACUTE, LEFT 11/18/2009   PALPITATIONS, RECURRENT 07/20/2008   POSTMENOPAUSAL STATUS 11/25/2006   PULMONARY NODULE 11/25/2006   Vitamin D deficiency    Past Surgical History:  Procedure Laterality Date   ABDOMINAL HYSTERECTOMY     COLONOSCOPY     left knee     PARATHYROIDECTOMY Right 11/20/2014   Procedure: RIGHT PARATHYROIDECTOMY;   Surgeon: Darnell Level, MD;  Location: [facility] OR;  Service: General;  Laterality: Right;   POLYPECTOMY     right wrist     THYROID SURGERY     early 70's   TOTAL HIP ARTHROPLASTY     left 3/05, right 2002   TOTAL HIP ARTHROPLASTY Left 2005   TOTAL KNEE ARTHROPLASTY Left 08/26/2020   Procedure: TOTAL KNEE ARTHROPLASTY;  Surgeon: Ollen Gross, MD;  Location: [facility] ORS;  Service: Orthopedics;  Laterality: Left;    UMBILICAL HERNIA REPAIR      reports that she quit smoking about 52 years ago. Her smoking use included cigarettes. She quit smokeless tobacco use about 46 years ago. She reports that she does not drink alcohol and does not use drugs. family history includes Breast cancer in her mother; Hypertension in an other family member; Stroke in her father. No Known Allergies Current Outpatient Medications on File Prior to Visit  Medication Sig Dispense Refill   alendronate (FOSAMAX) 70 MG tablet TAKE 1 TABLET ONCE EVERY WEEK (ON SUNDAY MORNINGS) WITH A FULL GLASS OF WATER ON AN EMPTY STOMACH 12 tablet 1   cholecalciferol (VITAMIN D) 1000 UNITS tablet Take 1,000 Units by mouth daily.     levothyroxine (SYNTHROID) 75 MCG tablet TAKE 1 TABLET EVERY DAY 90 tablet 3   lovastatin (MEVACOR) 20 MG tablet TAKE  1 TABLET EVERY DAY 90 tablet 0   methocarbamol (ROBAXIN) 500 MG tablet Take 1 tablet (500 mg total) by mouth every 6 (six) hours as needed for muscle spasms. 40 tablet 0   oxyCODONE (OXY IR/ROXICODONE) 5 MG immediate release tablet Take 1-2 tablets (5-10 mg total) by mouth every 6 (six) hours as needed for severe pain. 42 tablet 0   psyllium (HYDROCIL/METAMUCIL) 95 % PACK Take 1 packet by mouth daily as needed (constipation).      traMADol (ULTRAM) 50 MG tablet Take 1-2 tablets (50-100 mg total) by mouth every 6 (six) hours as needed for moderate pain. 40 tablet 0   valsartan (DIOVAN) 80 MG tablet TAKE 1 TABLET EVERY DAY 90 tablet 2   No current facility-administered medications on file prior to  visit.        ROS:  All others reviewed and negative.  Objective        PE:  BP 130/72 (BP Location: Right Arm, Patient Position: Sitting, Cuff Size: Large)   Pulse (!) 101   Temp 98.8 F (37.1 C) (Oral)   Ht 4\' 11"  (1.499 m)   Wt 170 lb 6.4 oz (77.3 kg)   SpO2 96%   BMI 34.42 kg/m                 Constitutional: Pt appears in NAD               HENT: Head: NCAT.                Right Ear: External ear normal.                 Left Ear: External ear normal.                Eyes: . Pupils are equal, round, and reactive to light. Conjunctivae and EOM are normal               Nose: without d/c or deformity               Neck: Neck supple. Gross normal ROM               Cardiovascular: Normal rate and regular rhythm.                 Pulmonary/Chest: Effort normal and breath sounds without rales or wheezing.                Abd:  Soft, NT, ND, + BS, no organomegaly               Neurological: Pt is alert. At baseline orientation, motor grossly intact               Skin: Skin is warm. No rashes, no other new lesions, LE edema - right > left trace to 1+, also with an area right post mid calf superficial phlebitis tender firmness approx 1.5 cm oval area, no overlying skin change                Psychiatric: Pt behavior is normal without agitation   Micro: none  Cardiac tracings I have personally interpreted today:  none  Pertinent Radiological findings (summarize): none   Lab Results  Component Value Date   WBC 3.7 (L) 12/10/2020   HGB 12.2 12/10/2020   HCT 37.3 12/10/2020   PLT 295.0 12/10/2020   GLUCOSE 81 12/10/2020   CHOL 180 04/08/2020   TRIG 62.0 04/08/2020   HDL 62.50 04/08/2020  LDLDIRECT 118.5 01/01/2012   LDLCALC 105 (H) 04/08/2020   ALT 6 12/10/2020   AST 15 12/10/2020   NA 140 12/10/2020   K 4.4 12/10/2020   CL 104 12/10/2020   CREATININE 0.67 12/10/2020   BUN 14 12/10/2020   CO2 29 12/10/2020   TSH 1.37 04/08/2020   INR 1.1 08/16/2020   HGBA1C 5.9 12/10/2020    Assessment/Plan:  [name] is a 75 y.o. Black or African American [2] female with  has a past medical history of Allergic rhinitis, cause unspecified (12/26/2010), Cataract, DEGENERATIVE JOINT DISEASE (11/25/2006), DEPRESSION (11/25/2006), DIVERTICULOSIS, COLON (12/04/2006), DIZZINESS (11/18/2009), DJD (degenerative joint disease), HYPERCHOLESTEROLEMIA (11/25/2006), HYPERLIPIDEMIA (11/18/2009), HYPERPARATHYROIDISM, HX OF (11/25/2006), HYPERTENSION (07/15/2007), HYPOTHYROIDISM (11/25/2006), OBESITY (12/04/2006), OSTEOPENIA (11/25/2006), OTITIS MEDIA, ACUTE, LEFT (11/18/2009), PALPITATIONS, RECURRENT (07/20/2008), POSTMENOPAUSAL STATUS (11/25/2006), PULMONARY NODULE (11/25/2006), and Vitamin D deficiency.  Hyperglycemia Lab Results  Component Value Date   HGBA1C 5.9 12/10/2020   Stable, pt to continue current medical treatment  - diet   Pain and swelling of lower leg, right Exam is c/w area of new onset superficial phlebitis,  - d/w pt, for restart asa at 325 qd, and check LE venous doppler r/o DVT  Essential hypertension BP Readings from Last 3 Encounters:  12/10/20 130/72  08/27/20 139/74  08/16/20 138/74   Stable, pt to continue medical treatment  - diovan 80   Peripheral edema Ok for change diuretic to lasix 20 qd prn, f/u at next visit,  to f/u any worsening symptoms or concerns  Followup: Return in about 4 weeks (around 01/07/2021).  Oliver Barre, MD 12/17/2020 8:43 PM [facility] Internal Medicine

## 2020-12-10 NOTE — Patient Instructions (Addendum)
It appears you may have superficial phlebitis to the back of the right calf;  Since we cant say there is no deeper clot as well, we should have you check a LE Venous Doppler test (you should be called asap - today for this to be done)  Also, please change the aspirin 81 mg to the Aspirin 325 mg per day (such as Ecotrin) which is OTC but very important to take.    Also we can give Lasix 20 mg per day to take AS NEEDED only for the swelling in the legs as we do more tests today to check on other things such as the blood tests.    Please continue all other medications as before, and refills have been done if requested.  Please have the pharmacy call with any other refills you may need.  Please continue your efforts at being more active, low cholesterol diet, and weight control.  Please keep your appointments with your specialists as you may have planned  Please go to the LAB at the blood drawing area for the tests to be done  You will be contacted by phone if any changes need to be made immediately.  Otherwise, you will receive a letter about your results with an explanation, but please check with MyChart first.  Please remember to sign up for MyChart if you have not done so, as this will be important to you in the future with finding out test results, communicating by private email, and scheduling acute appointments online when needed.  Please make an Appointment to return in 1 months, or sooner if needed

## 2020-12-11 LAB — D-DIMER, QUANTITATIVE: D-Dimer, Quant: 3.15 mcg/mL FEU — ABNORMAL HIGH (ref <0.50)

## 2020-12-12 ENCOUNTER — Telehealth: Payer: Self-pay

## 2020-12-12 ENCOUNTER — Ambulatory Visit (HOSPITAL_COMMUNITY)
Admission: RE | Admit: 2020-12-12 | Discharge: 2020-12-12 | Disposition: A | Discharge status: Home / Self Care | Payer: Medicare HMO | Source: Ambulatory Visit | Attending: Cardiology | Admitting: Cardiology

## 2020-12-12 ENCOUNTER — Other Ambulatory Visit: Payer: Self-pay

## 2020-12-12 DIAGNOSIS — M79661 Pain in right lower leg: Secondary | ICD-10-CM

## 2020-12-12 DIAGNOSIS — M7989 Other specified soft tissue disorders: Secondary | ICD-10-CM | POA: Diagnosis not present

## 2020-12-12 NOTE — Telephone Encounter (Signed)
This is what was expected  Pt should continue the aspirin as we mentioned, the acute problem should heal slowly over about 4-6 wks

## 2020-12-12 NOTE — Telephone Encounter (Signed)
Spoke with patient today. 

## 2020-12-16 ENCOUNTER — Other Ambulatory Visit: Payer: Self-pay | Admitting: Obstetrics and Gynecology

## 2020-12-16 DIAGNOSIS — Z1231 Encounter for screening mammogram for malignant neoplasm of breast: Secondary | ICD-10-CM

## 2020-12-17 ENCOUNTER — Encounter: Payer: Self-pay | Admitting: Internal Medicine

## 2020-12-17 DIAGNOSIS — R600 Localized edema: Secondary | ICD-10-CM | POA: Insufficient documentation

## 2020-12-17 DIAGNOSIS — M79661 Pain in right lower leg: Secondary | ICD-10-CM | POA: Insufficient documentation

## 2020-12-17 DIAGNOSIS — R609 Edema, unspecified: Secondary | ICD-10-CM | POA: Insufficient documentation

## 2020-12-17 DIAGNOSIS — M7989 Other specified soft tissue disorders: Secondary | ICD-10-CM | POA: Insufficient documentation

## 2020-12-17 NOTE — Assessment & Plan Note (Signed)
BP Readings from Last 3 Encounters:  12/10/20 130/72  08/27/20 139/74  08/16/20 138/74   Stable, pt to continue medical treatment  - diovan 80

## 2020-12-17 NOTE — Assessment & Plan Note (Signed)
Lab Results  Component Value Date   HGBA1C 5.9 12/10/2020   Stable, pt to continue current medical treatment  - diet

## 2020-12-17 NOTE — Assessment & Plan Note (Signed)
Ok for change diuretic to lasix 20 qd prn, f/u at next visit,  to f/u any worsening symptoms or concerns

## 2020-12-17 NOTE — Assessment & Plan Note (Addendum)
Exam is c/w area of new onset superficial phlebitis,  - d/w pt, for restart asa at 325 qd, and check LE venous doppler r/o DVT

## 2020-12-18 ENCOUNTER — Other Ambulatory Visit: Payer: Self-pay | Admitting: Internal Medicine

## 2020-12-18 NOTE — Telephone Encounter (Signed)
Please refill as per office routine med refill policy (all routine meds to be refilled for 3 mo or monthly (per pt preference) up to one year from last visit, then month to month grace period for 3 mo, then further med refills will have to be denied) ? ?

## 2020-12-20 ENCOUNTER — Ambulatory Visit
Admission: RE | Admit: 2020-12-20 | Discharge: 2020-12-20 | Disposition: A | Discharge status: Home / Self Care | Payer: Medicare HMO | Source: Ambulatory Visit | Attending: Obstetrics and Gynecology | Admitting: Obstetrics and Gynecology

## 2020-12-20 ENCOUNTER — Other Ambulatory Visit: Payer: Self-pay

## 2020-12-20 DIAGNOSIS — Z1231 Encounter for screening mammogram for malignant neoplasm of breast: Secondary | ICD-10-CM

## 2021-01-14 ENCOUNTER — Other Ambulatory Visit: Payer: Self-pay

## 2021-01-14 ENCOUNTER — Encounter: Payer: Self-pay | Admitting: Internal Medicine

## 2021-01-14 ENCOUNTER — Ambulatory Visit (INDEPENDENT_AMBULATORY_CARE_PROVIDER_SITE_OTHER): Payer: Medicare HMO | Admitting: Internal Medicine

## 2021-01-14 VITALS — BP 126/60 | HR 82 | Temp 98.7°F | Ht 59.0 in | Wt 168.0 lb

## 2021-01-14 DIAGNOSIS — I872 Venous insufficiency (chronic) (peripheral): Secondary | ICD-10-CM | POA: Diagnosis not present

## 2021-01-14 DIAGNOSIS — E559 Vitamin D deficiency, unspecified: Secondary | ICD-10-CM

## 2021-01-14 DIAGNOSIS — I8001 Phlebitis and thrombophlebitis of superficial vessels of right lower extremity: Secondary | ICD-10-CM

## 2021-01-14 DIAGNOSIS — E538 Deficiency of other specified B group vitamins: Secondary | ICD-10-CM | POA: Diagnosis not present

## 2021-01-14 DIAGNOSIS — I10 Essential (primary) hypertension: Secondary | ICD-10-CM

## 2021-01-14 DIAGNOSIS — R739 Hyperglycemia, unspecified: Secondary | ICD-10-CM

## 2021-01-14 NOTE — Assessment & Plan Note (Signed)
For compression stockings if able, leg elevation, low salt, refer vascular surgury per pt request

## 2021-01-14 NOTE — Progress Notes (Signed)
Patient ID: [name], female   DOB: 29-Apr-1945, 75 y.o.   MRN: [id number]        Chief Complaint: follow up right leg pain and swelling, left leg swelling, wt loss, htn       HPI:  [name] is a 75 y.o. female here overall some improved but still with significant pain and swelling to the right leg > left leg swelling, but Pt denies chest pain, increased sob or doe, wheezing, orthopnea, PND, palpitations, dizziness or syncope.   Pt denies fever, night sweats, loss of appetite, or other constitutional symptoms but has had significan wt loss with less appetitie for unclear reason.  Denies worsening reflux, abd pain, dysphagia, n/v, bowel change or blood.   Pt denies polydipsia, polyuria, or new focal neuro s/s.  Recent RLE venous doppler neg for acute dvt       Wt Readings from Last 3 Encounters:  01/14/21 168 lb (76.2 kg)  12/10/20 170 lb 6.4 oz (77.3 kg)  08/26/20 181 lb (82.1 kg)   BP Readings from Last 3 Encounters:  01/14/21 126/60  12/10/20 130/72  08/27/20 139/74         Past Medical History:  Diagnosis Date   Allergic rhinitis, cause unspecified 12/26/2010   Cataract    1 eye-? which   DEGENERATIVE JOINT DISEASE 11/25/2006   in knees    DEPRESSION 11/25/2006   DIVERTICULOSIS, COLON 12/04/2006   DIZZINESS 11/18/2009   DJD (degenerative joint disease)    Bilateral Hip   HYPERCHOLESTEROLEMIA 11/25/2006   HYPERLIPIDEMIA 11/18/2009   HYPERPARATHYROIDISM, HX OF 11/25/2006   HYPERTENSION 07/15/2007   HYPOTHYROIDISM 11/25/2006   OBESITY 12/04/2006   OSTEOPENIA 11/25/2006   OTITIS MEDIA, ACUTE, LEFT 11/18/2009   PALPITATIONS, RECURRENT 07/20/2008   POSTMENOPAUSAL STATUS 11/25/2006   PULMONARY NODULE 11/25/2006   Vitamin D deficiency    Past Surgical History:  Procedure Laterality Date   ABDOMINAL HYSTERECTOMY     COLONOSCOPY     left knee     PARATHYROIDECTOMY Right 11/20/2014   Procedure: RIGHT PARATHYROIDECTOMY;  Surgeon: Darnell Level, MD;  [facility] OR;   Service: General;  Laterality: Right;   POLYPECTOMY     right wrist     THYROID SURGERY     early 70's   TOTAL HIP ARTHROPLASTY     left 3/05, right 2002   TOTAL HIP ARTHROPLASTY Left 2005   TOTAL KNEE ARTHROPLASTY Left 08/26/2020   Procedure: TOTAL KNEE ARTHROPLASTY;  Surgeon: Ollen Gross, MD;  Location: [facility] ORS;  Service: Orthopedics;  Laterality: Left;    UMBILICAL HERNIA REPAIR      reports that she quit smoking about 52 years ago. Her smoking use included cigarettes. She quit smokeless tobacco use about 46 years ago. She reports that she does not drink alcohol and does not use drugs. family history includes Breast cancer in her mother; Hypertension in an other family member; Stroke in her father. No Known Allergies Current Outpatient Medications on File Prior to Visit  Medication Sig Dispense Refill   alendronate (FOSAMAX) 70 MG tablet TAKE 1 TABLET ONCE EVERY WEEK (ON SUNDAY MORNINGS) WITH A FULL GLASS OF WATER ON AN EMPTY STOMACH 12 tablet 1   aspirin 325 MG EC tablet Take 1 tablet (325 mg total) by mouth daily. 90 tablet 3   cholecalciferol (VITAMIN D) 1000 UNITS tablet Take 1,000 Units by mouth daily.     furosemide (LASIX) 20 MG tablet Take 1 tablet (20 mg total) by  mouth daily as needed. 30 tablet 3   levothyroxine (SYNTHROID) 75 MCG tablet TAKE 1 TABLET EVERY DAY 90 tablet 3   lovastatin (MEVACOR) 20 MG tablet TAKE 1 TABLET EVERY DAY 90 tablet 3   methocarbamol (ROBAXIN) 500 MG tablet Take 1 tablet (500 mg total) by mouth every 6 (six) hours as needed for muscle spasms. 40 tablet 0   oxyCODONE (OXY IR/ROXICODONE) 5 MG immediate release tablet Take 1-2 tablets (5-10 mg total) by mouth every 6 (six) hours as needed for severe pain. 42 tablet 0   psyllium (HYDROCIL/METAMUCIL) 95 % PACK Take 1 packet by mouth daily as needed (constipation).      traMADol (ULTRAM) 50 MG tablet Take 1-2 tablets (50-100 mg total) by mouth every 6 (six) hours as needed for moderate pain. 40  tablet 0   valsartan (DIOVAN) 80 MG tablet TAKE 1 TABLET EVERY DAY 90 tablet 2   No current facility-administered medications on file prior to visit.        ROS:  All others reviewed and negative.  Objective        PE:  BP 126/60 (BP Location: Left Arm, Patient Position: Sitting, Cuff Size: Normal)   Pulse 82   Temp 98.7 F (37.1 C) (Oral)   Ht 4\' 11"  (1.499 m)   Wt 168 lb (76.2 kg)   SpO2 100%   BMI 33.93 kg/m                 Constitutional: Pt appears in NAD               HENT: Head: NCAT.                Right Ear: External ear normal.                 Left Ear: External ear normal.                Eyes: . Pupils are equal, round, and reactive to light. Conjunctivae and EOM are normal               Nose: without d/c or deformity               Neck: Neck supple. Gross normal ROM               Cardiovascular: Normal rate and regular rhythm.                 Pulmonary/Chest: Effort normal and breath sounds without rales or wheezing.                Abd:  Soft, NT, ND, + BS, no organomegaly               Neurological: Pt is alert. At baseline orientation, motor grossly intact               Skin: Skin is warm. No rashes, no other new lesions, LE edema - 2+ RLE with post calf firm phlebitic area persistent; LLE 1+ edema as well but nontender               Psychiatric: Pt behavior is normal without agitation   Micro: none  Cardiac tracings I have personally interpreted today:  none  Pertinent Radiological findings (summarize): none   Lab Results  Component Value Date   WBC 3.7 (L) 12/10/2020   HGB 12.2 12/10/2020   HCT 37.3 12/10/2020   PLT 295.0 12/10/2020   GLUCOSE 81 12/10/2020  CHOL 180 04/08/2020   TRIG 62.0 04/08/2020   HDL 62.50 04/08/2020   LDLDIRECT 118.5 01/01/2012   LDLCALC 105 (H) 04/08/2020   ALT 6 12/10/2020   AST 15 12/10/2020   NA 140 12/10/2020   K 4.4 12/10/2020   CL 104 12/10/2020   CREATININE 0.67 12/10/2020   BUN 14 12/10/2020   CO2 29 12/10/2020    TSH 1.37 04/08/2020   INR 1.1 08/16/2020   HGBA1C 5.9 12/10/2020   Assessment/Plan:  [name] is a 75 y.o. Black or African American [2] female with  has a past medical history of Allergic rhinitis, cause unspecified (12/26/2010), Cataract, DEGENERATIVE JOINT DISEASE (11/25/2006), DEPRESSION (11/25/2006), DIVERTICULOSIS, COLON (12/04/2006), DIZZINESS (11/18/2009), DJD (degenerative joint disease), HYPERCHOLESTEROLEMIA (11/25/2006), HYPERLIPIDEMIA (11/18/2009), HYPERPARATHYROIDISM, HX OF (11/25/2006), HYPERTENSION (07/15/2007), HYPOTHYROIDISM (11/25/2006), OBESITY (12/04/2006), OSTEOPENIA (11/25/2006), OTITIS MEDIA, ACUTE, LEFT (11/18/2009), PALPITATIONS, RECURRENT (07/20/2008), POSTMENOPAUSAL STATUS (11/25/2006), PULMONARY NODULE (11/25/2006), and Vitamin D deficiency.  Essential hypertension BP Readings from Last 3 Encounters:  01/14/21 126/60  12/10/20 130/72  08/27/20 139/74   Stable, pt to continue medical treatment diovan, lasix   Hyperglycemia Lab Results  Component Value Date   HGBA1C 5.9 12/10/2020   Stable, pt to continue current medical treatment  - diet   Venous insufficiency of both lower extremities For compression stockings if able, leg elevation, low salt, refer vascular surgury per pt request  Superficial phlebitis of leg, right Some improved I suspect but persistent, cont asa 81 qd  Followup: Return in about 3 months (around 04/15/2021).  Oliver Barre, MD 01/14/2021 10:04 PM [facility] [place] [facility] - [facility] Internal Medicine

## 2021-01-14 NOTE — Assessment & Plan Note (Signed)
BP Readings from Last 3 Encounters:  01/14/21 126/60  12/10/20 130/72  08/27/20 139/74   Stable, pt to continue medical treatment diovan, lasix

## 2021-01-14 NOTE — Assessment & Plan Note (Signed)
Some improved I suspect but persistent, cont asa 81 qd

## 2021-01-14 NOTE — Assessment & Plan Note (Signed)
Lab Results  Component Value Date   HGBA1C 5.9 12/10/2020   Stable, pt to continue current medical treatment  - diet

## 2021-01-14 NOTE — Patient Instructions (Addendum)
Please remember to keep your legs elevated when sitting at home  Please continue all other medications as before, and refills have been done if requested.  Please have the pharmacy call with any other refills you may need.  Please continue your efforts at being more active, low cholesterol diet, and weight control  Please keep your appointments with your specialists as you may have planned  You will be contacted regarding the referral for: Vascular Surgury  Please make an Appointment to return in 3 months, or sooner if needed, also with Lab Appointment for testing done 3-5 days before at the FIRST FLOOR Lab (so this is for TWO appointments - please see the scheduling desk as you leave)  Due to the ongoing Covid 19 pandemic, our lab now requires an appointment for any labs done at our office.  If you need labs done and do not have an appointment, please call our office ahead of time to schedule before presenting to the lab for your testing.

## 2021-01-28 ENCOUNTER — Other Ambulatory Visit: Payer: Self-pay | Admitting: Internal Medicine

## 2021-01-28 NOTE — Telephone Encounter (Signed)
Please refill as per office routine med refill policy (all routine meds to be refilled for 3 mo or monthly (per pt preference) up to one year from last visit, then month to month grace period for 3 mo, then further med refills will have to be denied) ? ?

## 2021-02-20 ENCOUNTER — Encounter (HOSPITAL_COMMUNITY): Payer: Medicare HMO

## 2021-02-21 ENCOUNTER — Other Ambulatory Visit: Payer: Self-pay

## 2021-02-21 DIAGNOSIS — M79662 Pain in left lower leg: Secondary | ICD-10-CM

## 2021-02-21 DIAGNOSIS — M7989 Other specified soft tissue disorders: Secondary | ICD-10-CM

## 2021-03-05 ENCOUNTER — Emergency Department (HOSPITAL_COMMUNITY): Payer: Medicare HMO

## 2021-03-05 ENCOUNTER — Other Ambulatory Visit: Payer: Self-pay

## 2021-03-05 ENCOUNTER — Encounter (HOSPITAL_COMMUNITY): Payer: Self-pay | Admitting: Emergency Medicine

## 2021-03-05 ENCOUNTER — Emergency Department (HOSPITAL_COMMUNITY)
Admission: EM | Admit: 2021-03-05 | Discharge: 2021-03-05 | Disposition: A | Discharge status: Home / Self Care | Payer: Medicare HMO | Attending: Emergency Medicine | Admitting: Emergency Medicine

## 2021-03-05 DIAGNOSIS — S73004A Unspecified dislocation of right hip, initial encounter: Secondary | ICD-10-CM | POA: Diagnosis not present

## 2021-03-05 DIAGNOSIS — S79911A Unspecified injury of right hip, initial encounter: Secondary | ICD-10-CM | POA: Diagnosis present

## 2021-03-05 DIAGNOSIS — Z4732 Aftercare following explantation of hip joint prosthesis: Secondary | ICD-10-CM | POA: Insufficient documentation

## 2021-03-05 DIAGNOSIS — T84020A Dislocation of internal right hip prosthesis, initial encounter: Secondary | ICD-10-CM | POA: Diagnosis not present

## 2021-03-05 DIAGNOSIS — X509XXA Other and unspecified overexertion or strenuous movements or postures, initial encounter: Secondary | ICD-10-CM | POA: Diagnosis not present

## 2021-03-05 DIAGNOSIS — Z7982 Long term (current) use of aspirin: Secondary | ICD-10-CM | POA: Insufficient documentation

## 2021-03-05 DIAGNOSIS — R0902 Hypoxemia: Secondary | ICD-10-CM | POA: Diagnosis not present

## 2021-03-05 DIAGNOSIS — Z9889 Other specified postprocedural states: Secondary | ICD-10-CM

## 2021-03-05 LAB — CBC WITH DIFFERENTIAL/PLATELET
Abs Immature Granulocytes: 0.02 10*3/uL (ref 0.00–0.07)
Basophils Absolute: 0 10*3/uL (ref 0.0–0.1)
Basophils Relative: 0 %
Eosinophils Absolute: 0 10*3/uL (ref 0.0–0.5)
Eosinophils Relative: 0 %
HCT: 38 % (ref 36.0–46.0)
Hemoglobin: 12 g/dL (ref 12.0–15.0)
Immature Granulocytes: 1 %
Lymphocytes Relative: 11 %
Lymphs Abs: 0.4 10*3/uL — ABNORMAL LOW (ref 0.7–4.0)
MCH: 28.7 pg (ref 26.0–34.0)
MCHC: 31.6 g/dL (ref 30.0–36.0)
MCV: 90.9 fL (ref 80.0–100.0)
Monocytes Absolute: 0.3 10*3/uL (ref 0.1–1.0)
Monocytes Relative: 9 %
Neutro Abs: 3.1 10*3/uL (ref 1.7–7.7)
Neutrophils Relative %: 79 %
Platelets: 256 10*3/uL (ref 150–400)
RBC: 4.18 MIL/uL (ref 3.87–5.11)
RDW: 14.3 % (ref 11.5–15.5)
WBC: 3.9 10*3/uL — ABNORMAL LOW (ref 4.0–10.5)
nRBC: 0 % (ref 0.0–0.2)

## 2021-03-05 LAB — BASIC METABOLIC PANEL
Anion gap: 6 (ref 5–15)
BUN: 14 mg/dL (ref 8–23)
CO2: 27 mmol/L (ref 22–32)
Calcium: 8.8 mg/dL — ABNORMAL LOW (ref 8.9–10.3)
Chloride: 105 mmol/L (ref 98–111)
Creatinine, Ser: 0.7 mg/dL (ref 0.44–1.00)
GFR, Estimated: >60 mL/min (ref >60)
Glucose, Bld: 127 mg/dL — ABNORMAL HIGH (ref 70–99)
Potassium: 3.5 mmol/L (ref 3.5–5.1)
Sodium: 138 mmol/L (ref 135–145)

## 2021-03-05 MED ORDER — KETAMINE HCL 10 MG/ML IJ SOLN
INTRAMUSCULAR | Status: AC | PRN
  Administered 2021-03-05: 30 mg via INTRAVENOUS
  Administered 2021-03-05: 25 mg via INTRAVENOUS
  Administered 2021-03-05: 10 mg via INTRAVENOUS
  Administered 2021-03-05: 20 mg via INTRAVENOUS

## 2021-03-05 MED ORDER — KETAMINE HCL 50 MG/5ML IJ SOSY
2.0000 mg/kg | PREFILLED_SYRINGE | Freq: Once | INTRAMUSCULAR | Status: DC
  Filled 2021-03-05: qty 20

## 2021-03-05 NOTE — ED Triage Notes (Signed)
Pt BIBA from home, reports sitting in shower seat and began having R hip pain when trying to get up from seat. EMS reports shortening of R leg. Denies fall or injury. No meds given - patient denies pain at this time.    BP 148/90 P 96 RR 16 99-100% RA  T 97.8

## 2021-03-05 NOTE — Discharge Instructions (Signed)
Call your orthopedic surgeon in the next 2-3 days.   Return immediately back to the ER if:  Your symptoms worsen within the next 12-24 hours. You develop new symptoms such as new fevers, persistent vomiting, new pain, shortness of breath, or new weakness or numbness, or if you have any other concerns.

## 2021-03-05 NOTE — ED Provider Notes (Signed)
[facility]-EMERGENCY DEPT Provider Note   CSN: [id number] Arrival date & time: 03/05/21  0630     History  Chief Complaint  Patient presents with   Hip Injury    [name] is a 76 y.o. female.  Patient presents to ER chief complaint of right hip pain.  She states that she was sitting down taking a bath and when she tried to stand up she felt her right hip pop out.  Denies fall she sat the right back down.  Denies fevers or cough or vomiting or diarrhea.  Denies headache or chest pain or abdominal pain.      Home Medications Prior to Admission medications   Medication Sig Start Date End Date Taking? Authorizing Provider  alendronate (FOSAMAX) 70 MG tablet TAKE 1 TABLET ONCE EVERY WEEK (ON SUNDAY MORNINGS) WITH A FULL GLASS OF WATER ON AN EMPTY STOMACH Patient taking differently: Take 70 mg by mouth once a week. Sunday's 01/29/21  Yes Corwin Levins, MD  aspirin 325 MG EC tablet Take 1 tablet (325 mg total) by mouth daily. Patient taking differently: Take 325 mg by mouth daily as needed for pain (headache). 12/10/20  Yes Corwin Levins, MD  aspirin EC 81 MG tablet Take 81 mg by mouth daily. Swallow whole.   Yes [provider]  cholecalciferol (VITAMIN D) 1000 UNITS tablet Take 1,000 Units by mouth daily.   Yes [provider]  furosemide (LASIX) 20 MG tablet Take 1 tablet (20 mg total) by mouth daily as needed. 12/10/20  Yes Corwin Levins, MD  levothyroxine (SYNTHROID) 75 MCG tablet TAKE 1 TABLET EVERY DAY Patient taking differently: Take 75 mcg by mouth daily. 07/09/20  Yes Corwin Levins, MD  lovastatin (MEVACOR) 20 MG tablet TAKE 1 TABLET EVERY DAY Patient taking differently: Take 20 mg by mouth daily. 12/18/20  Yes Corwin Levins, MD  psyllium (HYDROCIL/METAMUCIL) 95 % PACK Take 1 packet by mouth daily as needed (constipation).    Yes [provider]  valsartan (DIOVAN) 80 MG tablet TAKE 1 TABLET EVERY DAY Patient taking  differently: Take 80 mg by mouth daily. 06/28/20  Yes Corwin Levins, MD      Allergies    Patient has no known allergies.    Review of Systems   Review of Systems  Constitutional:  Negative for fever.  HENT:  Negative for ear pain.   Eyes:  Negative for pain.  Respiratory:  Negative for cough.   Cardiovascular:  Negative for chest pain.  Gastrointestinal:  Negative for abdominal pain.  Genitourinary:  Negative for flank pain.  Musculoskeletal:  Negative for back pain.  Skin:  Negative for rash.  Neurological:  Negative for headaches.   Physical Exam Updated Vital Signs BP (!) 154/89    Pulse (!) 113    Temp 98.9 F (37.2 C) (Oral)    Resp 20    Wt 76 kg    SpO2 100%    BMI 33.84 kg/m  Physical Exam Constitutional:      General: She is not in acute distress.    Appearance: Normal appearance.  HENT:     Head: Normocephalic.     Nose: Nose normal.  Eyes:     Extraocular Movements: Extraocular movements intact.  Cardiovascular:     Rate and Rhythm: Normal rate.  Pulmonary:     Effort: Pulmonary effort is normal.  Musculoskeletal:     Cervical back: Normal range of motion.  Comments: Pain with attempted range of motion of the right hip.  Right lower extremity appears shortened.  Otherwise neurovascularly intact.  Neurological:     General: No focal deficit present.     Mental Status: She is alert. Mental status is at baseline.    ED Results / Procedures / Treatments   Labs (all labs ordered are listed, but only abnormal results are displayed) Labs Reviewed  CBC WITH DIFFERENTIAL/PLATELET - Abnormal; Notable for the following components:      Result Value   WBC 3.9 (*)    Lymphs Abs 0.4 (*)    All other components within normal limits  BASIC METABOLIC PANEL - Abnormal; Notable for the following components:   Glucose, Bld 127 (*)    Calcium 8.8 (*)    All other components within normal limits    EKG None  Radiology DG HIP PORT UNILAT WITH PELVIS 1V  RIGHT  Result Date: 03/05/2021 CLINICAL DATA:  Prosthetic right hip dislocation.  Postreduction. EXAM: DG HIP (WITH OR WITHOUT PELVIS) 1V PORT RIGHT COMPARISON:  Radiographs earlier the same date and 01/29/2006. FINDINGS: 1125 hours. Single AP view demonstrates apparent successful reduction of the dislocated right total hip arthroplasty. No fractures are identified. The distal end of the femoral prosthesis is not imaged. Left total hip arthroplasty is partially imaged. IMPRESSION: Based on single AP view, there is apparent successful reduction of the dislocated right total hip arthroplasty (suboptimally evaluated in a single projection). Electronically Signed   By: Carey BullocksWilliam  Veazey M.D.   On: 03/05/2021 11:33   DG Hip Unilat  With Pelvis 2-3 Views Right  Result Date: 03/05/2021 CLINICAL DATA:  Fall, hip dislocation EXAM: DG HIP (WITH OR WITHOUT PELVIS) 2-3V RIGHT COMPARISON:  01/29/2006 FINDINGS: There is superior dislocation of right hip arthroplasty. No obvious associated fracture. The left hip arthroplasty remains in expected apposition. No displaced fracture of the bony pelvis. Nonobstructive pattern of overlying bowel gas. IMPRESSION: There is superior dislocation of right hip arthroplasty. No obvious associated fracture. Electronically Signed   By: Jearld LeschAlex D Bibbey M.D.   On: 03/05/2021 09:52    Procedures .Ortho Injury Treatment  Date/Time: 03/05/2021 12:41 PM Performed by: Cheryll CockayneHong, Jalia Zuniga S, MD Authorized by: Cheryll CockayneHong, Naryiah Schley S, MD  Post-procedure neurovascular assessment: post-procedure neurovascularly intact Comments: Closed reduction of the right hip.  My knee was placed as a fulcrum underneath the patient's right knee and traction applied with pressure placed on the right hip.  Clunk was felt as the hip was reduced.  Neurovascular intact after reduction.   .Sedation  Date/Time: 03/05/2021 12:41 PM Performed by: Cheryll CockayneHong, Jakwan Sally S, MD Authorized by: Cheryll CockayneHong, Jaheem Hedgepath S, MD   Consent:    Consent  obtained:  Verbal and written   Consent given by:  Patient   Risks discussed:  Allergic reaction, prolonged hypoxia resulting in organ damage, prolonged sedation necessitating reversal, inadequate sedation, respiratory compromise necessitating ventilatory assistance and intubation and vomiting   Alternatives discussed:  Analgesia without sedation Universal protocol:    Immediately prior to procedure, a time out was called: yes   Indications:    Procedure performed:  Dislocation reduction Pre-sedation assessment:    Time since last food or drink:  9 AM   ASA classification: class 2 - patient with mild systemic disease     Mallampati score:  I - soft palate, uvula, fauces, pillars visible   Pre-sedation assessments completed and reviewed: pre-procedure airway patency not reviewed, pre-procedure mental status not reviewed and pre-procedure nausea and vomiting  status not reviewed   Procedure details (see MAR for exact dosages):    Total Provider sedation time (minutes):  10 Post-procedure details:    Procedure completion:  Tolerated well, no immediate complications    Medications Ordered in ED Medications  ketamine 50 mg in normal saline 5 mL (10 mg/mL) syringe ( Intravenous Canceled Entry 03/05/21 1127)  ketamine (KETALAR) injection (10 mg Intravenous Given 03/05/21 1110)    ED Course/ Medical Decision Making/ A&P                           Medical Decision Making Patient has a prosthetic right hip which became dislocated as she was doing a sitting standing position change.  Patient had procedural sedation with ketamine total of 75 mg given subsequent reduction of the right hip by myself.  Neurovascularly intact after reduction.  Advised her to follow-up with her orthopedic surgeon by phone call tomorrow.  Advising immediate return for worsening pain fevers or any additional concerns.  Amount and/or Complexity of Data Reviewed Labs: ordered. Radiology: ordered.  Risk Prescription  drug management. Parenteral controlled substances. Drug therapy requiring intensive monitoring for toxicity.           Final Clinical Impression(s) / ED Diagnoses Final diagnoses:  Status post closed reduction of dislocated total hip prosthesis    Rx / DC Orders ED Discharge Orders     None         Cheryll Cockayne, MD 03/05/21 1243

## 2021-03-06 ENCOUNTER — Encounter (HOSPITAL_COMMUNITY): Payer: Medicare HMO

## 2021-03-07 ENCOUNTER — Ambulatory Visit: Payer: Medicare HMO

## 2021-03-07 ENCOUNTER — Other Ambulatory Visit: Payer: Self-pay

## 2021-03-14 ENCOUNTER — Ambulatory Visit (INDEPENDENT_AMBULATORY_CARE_PROVIDER_SITE_OTHER): Payer: Medicare HMO

## 2021-03-14 ENCOUNTER — Other Ambulatory Visit: Payer: Self-pay

## 2021-03-14 ENCOUNTER — Ambulatory Visit: Payer: Medicare HMO

## 2021-03-14 DIAGNOSIS — Z0000 Encounter for general adult medical examination without abnormal findings: Secondary | ICD-10-CM | POA: Diagnosis not present

## 2021-03-14 NOTE — Progress Notes (Signed)
I [facility] today by telephone and verified that I am speaking with the correct person using two identifiers. Location patient: home Location provider: work Persons participating in the virtual visit: patient, provider.   I discussed the limitations, risks, security and privacy concerns of performing an evaluation and management service by telephone and the availability of in person appointments. I also discussed with the patient that there may be a patient responsible charge related to this service. The patient expressed understanding and verbally consented to this telephonic visit.    Interactive audio and video telecommunications were attempted between this provider and patient, however failed, due to patient having technical difficulties OR patient did not have access to video capability.  We continued and completed visit with audio only.  Some vital signs may be absent or patient reported.   Time Spent with patient on telephone encounter: 35 minutes  Subjective:   Lori HatterMyrtle M Stark is a 76 y.o. female who presents for Medicare Annual (Subsequent) preventive examination.  Review of Systems     Cardiac Risk Factors include: advanced age (>6855men, 7>65 women);dyslipidemia;family history of premature cardiovascular disease;hypertension     Objective:    Today's Vitals   03/14/21 1056  PainSc: 0-No pain   There is no height or weight on file to calculate BMI.  Advanced Directives 03/14/2021 08/26/2020 08/16/2020 02/22/2020 08/30/2018 08/24/2017 08/14/2016  Does Patient Have a Medical Advance Directive? No No No No No No No  Would patient like information on creating a medical advance directive? Yes (ED - Information included in AVS) No - Patient declined - No - Patient declined Yes (ED - Information included in AVS) No - Patient declined Yes (ED - Information included in AVS)    Current Medications (verified) Outpatient Encounter Medications as of 03/14/2021  Medication Sig    alendronate (FOSAMAX) 70 MG tablet TAKE 1 TABLET ONCE EVERY WEEK (ON SUNDAY MORNINGS) WITH A FULL GLASS OF WATER ON AN EMPTY STOMACH (Patient taking differently: Take 70 mg by mouth once a week. Sunday's)   aspirin 325 MG EC tablet Take 1 tablet (325 mg total) by mouth daily. (Patient taking differently: Take 325 mg by mouth daily as needed for pain (headache).)   aspirin EC 81 MG tablet Take 81 mg by mouth daily. Swallow whole.   cholecalciferol (VITAMIN D) 1000 UNITS tablet Take 1,000 Units by mouth daily.   furosemide (LASIX) 20 MG tablet Take 1 tablet (20 mg total) by mouth daily as needed.   levothyroxine (SYNTHROID) 75 MCG tablet TAKE 1 TABLET EVERY DAY (Patient taking differently: Take 75 mcg by mouth daily.)   lovastatin (MEVACOR) 20 MG tablet TAKE 1 TABLET EVERY DAY (Patient taking differently: Take 20 mg by mouth daily.)   psyllium (HYDROCIL/METAMUCIL) 95 % PACK Take 1 packet by mouth daily as needed (constipation).    valsartan (DIOVAN) 80 MG tablet TAKE 1 TABLET EVERY DAY (Patient taking differently: Take 80 mg by mouth daily.)   No facility-administered encounter medications on file as of 03/14/2021.    Allergies (verified) Patient has no known allergies.   History: Past Medical History:  Diagnosis Date   Allergic rhinitis, cause unspecified 12/26/2010   Cataract    1 eye-? which   DEGENERATIVE JOINT DISEASE 11/25/2006   in knees    DEPRESSION 11/25/2006   DIVERTICULOSIS, COLON 12/04/2006   DIZZINESS 11/18/2009   DJD (degenerative joint disease)    Bilateral Hip   HYPERCHOLESTEROLEMIA 11/25/2006   HYPERLIPIDEMIA 11/18/2009   HYPERPARATHYROIDISM, HX  OF 11/25/2006   HYPERTENSION 07/15/2007   HYPOTHYROIDISM 11/25/2006   OBESITY 12/04/2006   OSTEOPENIA 11/25/2006   OTITIS MEDIA, ACUTE, LEFT 11/18/2009   PALPITATIONS, RECURRENT 07/20/2008   POSTMENOPAUSAL STATUS 11/25/2006   PULMONARY NODULE 11/25/2006   Vitamin D deficiency    Past Surgical History:  Procedure  Laterality Date   ABDOMINAL HYSTERECTOMY     COLONOSCOPY     left knee     PARATHYROIDECTOMY Right 11/20/2014   Procedure: RIGHT PARATHYROIDECTOMY;  Surgeon: Darnell Level, MD;  [facility];  Service: General;  Laterality: Right;   POLYPECTOMY     right wrist     THYROID SURGERY     early 70's   TOTAL HIP ARTHROPLASTY     left 3/05, right 2002   TOTAL HIP ARTHROPLASTY Left 2005   TOTAL KNEE ARTHROPLASTY Left 08/26/2020   Procedure: TOTAL KNEE ARTHROPLASTY;  Surgeon: Ollen Gross, MD;  Location: [facility] ORS;  Service: Orthopedics;  Laterality: Left;    UMBILICAL HERNIA REPAIR     Family History  Problem Relation Age of Onset   Breast cancer Mother    Stroke Father    Hypertension Other    Colon cancer Neg Hx    Esophageal cancer Neg Hx    Rectal cancer Neg Hx    Stomach cancer Neg Hx    Social History   Socioeconomic History   Marital status: Widowed    Spouse name: Not on file   Number of children: 2   Years of education: Not on file   Highest education level: Not on file  Occupational History   Occupation: retired  Tobacco Use   Smoking status: Former    Years: 1.00    Types: Cigarettes    Quit date: 02/17/1968    Years since quitting: 53.1   Smokeless tobacco: Former    Quit date: 11/12/1974   Tobacco comments:    tried snuff as a child, casual smoker as young adult  Vaping Use   Vaping Use: Never used  Substance and Sexual Activity   Alcohol use: No    Alcohol/week: 0.0 standard drinks   Drug use: No   Sexual activity: Never  Other Topics Concern   Not on file  Social History Narrative   Not on file   Social Determinants of Health   Financial Resource Strain: Low Risk    Difficulty of Paying Living Expenses: Not hard at all  Food Insecurity: No Food Insecurity   Worried About Programme researcher, broadcasting/film/video in the Last Year: Never true   Ran Out of Food in the Last Year: Never true  Transportation Needs: No Transportation Needs   Lack of Transportation  (Medical): No   Lack of Transportation (Non-Medical): No  Physical Activity: Sufficiently Active   Days of Exercise per Week: 5 days   Minutes of Exercise per Session: 30 min  Stress: No Stress Concern Present   Feeling of Stress : Not at all  Social Connections: Moderately Integrated   Frequency of Communication with Friends and Family: More than three times a week   Frequency of Social Gatherings with Friends and Family: More than three times a week   Attends Religious Services: More than 4 times per year   Active Member of Clubs or Organizations: No   Attends Engineer, structural: More than 4 times per year   Marital Status: Widowed    Tobacco Counseling Counseling given: Not Answered Tobacco comments: tried snuff as a child, casual  smoker as young adult   Clinical Intake:  Pre-visit preparation completed: Yes  Pain : No/denies pain Pain Score: 0-No pain     Nutritional Risks: None Diabetes: No  How often do you need to have someone help you when you read instructions, pamphlets, or other written materials from your doctor or pharmacy?: 1 - Never What is the last grade level you completed in school?: 12th grade  Diabetic?no  Interpreter Needed?: No  Information entered by :: Susie Cassette, LPN   Activities of Daily Living In your present state of health, do you have any difficulty performing the following activities: 03/14/2021 08/26/2020  Hearing? N N  Vision? N N  Difficulty concentrating or making decisions? N Y  Walking or climbing stairs? N Y  Dressing or bathing? N Y  Doing errands, shopping? N N  Preparing Food and eating ? N -  Using the Toilet? N -  In the past six months, have you accidently leaked urine? N -  Do you have problems with loss of bowel control? N -  Managing your Medications? N -  Managing your Finances? N -  Housekeeping or managing your Housekeeping? N -  Some recent data might be hidden    Patient Care Team: Corwin Levins, MD as PCP - General Nyoka Cowden, MD as Consulting Physician (Pulmonary Disease) Ollen Gross, MD as Consulting Physician (Orthopedic Surgery) Ander Purpura, OD as Consulting Physician (Optometry)  Indicate any recent Medical Services you may have received from other than [facility] providers in the past year (date may be approximate).     Assessment:   This is a routine wellness examination for [facility].  Hearing/Vision screen Hearing Screening - Comments:: Patient denied any hearing difficulty.   No hearing aids.  Vision Screening - Comments:: Patient wears corrective glasses/contacts.  Eye exam done annually by: Ander Purpura, OD.  Dietary issues and exercise activities discussed: Current Exercise Habits: Home exercise routine, Type of exercise: walking, Time (Minutes): 30, Frequency (Times/Week): 7, Weekly Exercise (Minutes/Week): 210, Intensity: Mild, Exercise limited by: orthopedic condition(s)   Goals Addressed               This Visit's Progress     Patient Stated (pt-stated)        My goal is to get mobile so that my legs/hips will get more stronger.      Depression Screen PHQ 2/9 Scores 03/14/2021 04/02/2020 02/22/2020 07/25/2019 07/25/2019 08/30/2018 07/22/2018  PHQ - 2 Score 0 0 0 0 0 1 0  PHQ- 9 Score - - - - - 1 -    Fall Risk Fall Risk  03/14/2021 04/02/2020 02/22/2020 07/25/2019 07/25/2019  Falls in the past year? 0 0 0 0 0  Number falls in past yr: 0 - 0 - 0  Injury with Fall? 0 - 0 - 0  Comment - - - - -  Risk for fall due to : Impaired balance/gait - No Fall Risks - Impaired balance/gait  Risk for fall due to: Comment - - - - -  Follow up Falls evaluation completed - Falls evaluation completed - Falls evaluation completed    FALL RISK PREVENTION PERTAINING TO THE HOME:  Any stairs in or around the home? No  If so, are there any without handrails? No  Home free of loose throw rugs in walkways, pet beds, electrical cords, etc? Yes  Adequate lighting in your home  to reduce risk of falls? Yes   ASSISTIVE DEVICES UTILIZED TO PREVENT  FALLS:  Life alert? No  Use of a cane, walker or w/c? Yes  Grab bars in the bathroom? Yes  Shower chair or bench in shower? Yes  Elevated toilet seat or a handicapped toilet? Yes   TIMED UP AND GO:  Was the test performed? No .  Length of time to ambulate 10 feet: n/a sec.   Gait slow and steady with assistive device  Cognitive Function: Normal cognitive status assessed by direct observation by this Nurse Health Advisor. No abnormalities found.   MMSE - Mini Mental State Exam 08/24/2017 08/14/2016 01/14/2015  Orientation to time 5 5 5   Orientation to Place 5 5 5   Registration 3 3 3   Attention/ Calculation 5 4 2   Recall 2 2 2   Language- name 2 objects 2 2 2   Language- repeat 1 1 1   Language- follow 3 step command 3 3 3   Language- read & follow direction 1 1 1   Write a sentence 1 1 1   Copy design 1 1 1   Total score 29 28 26         Immunizations Immunization History  Administered Date(s) Administered   Moderna Sars-Covid-2 Vaccination 03/22/2019, 03/31/2019, 04/12/2019, 04/28/2019, 01/23/2020   Td 07/21/1995, 11/18/2009    TDAP status: Due, Education has been provided regarding the importance of this vaccine. Advised may receive this vaccine at local pharmacy or Health Dept. Aware to provide a copy of the vaccination record if obtained from local pharmacy or Health Dept. Verbalized acceptance and understanding.  Flu Vaccine status: Due, Education has been provided regarding the importance of this vaccine. Advised may receive this vaccine at local pharmacy or Health Dept. Aware to provide a copy of the vaccination record if obtained from local pharmacy or Health Dept. Verbalized acceptance and understanding.  Pneumococcal vaccine status: Due, Education has been provided regarding the importance of this vaccine. Advised may receive this vaccine at local pharmacy or Health Dept. Aware to provide a copy of the  vaccination record if obtained from local pharmacy or Health Dept. Verbalized acceptance and understanding.  Covid-19 vaccine status: Completed vaccines  Qualifies for Shingles Vaccine? Yes   Zostavax completed No   Shingrix Completed?: No.    Education has been provided regarding the importance of this vaccine. Patient has been advised to call insurance company to determine out of pocket expense if they have not yet received this vaccine. Advised may also receive vaccine at local pharmacy or Health Dept. Verbalized acceptance and understanding.  Screening Tests Health Maintenance  Topic Date Due   Pneumonia Vaccine 55+ Years old (1 - PCV) Never done   Zoster Vaccines- Shingrix (1 of 2) Never done   TETANUS/TDAP  04/02/2021 (Originally 11/19/2019)   DEXA SCAN  Completed   COVID-19 Vaccine  Completed   Hepatitis C Screening  Completed   HPV VACCINES  Aged Out   INFLUENZA VACCINE  Discontinued   COLONOSCOPY (Pts 45-82yrs Insurance coverage will need to be confirmed)  Discontinued    Health Maintenance  Health Maintenance Due  Topic Date Due   Pneumonia Vaccine 89+ Years old (1 - PCV) Never done   Zoster Vaccines- Shingrix (1 of 2) Never done    Colorectal cancer screening: No longer required.   Mammogram status: Completed 12/20/2020. Repeat every year  Bone Density status: Completed 07/21/2016. Results reflect: Bone density results: OSTEOPOROSIS. Repeat every 2-3 years.  Lung Cancer Screening: (Low Dose CT Chest recommended if Age 55-80 years, 30 pack-year currently smoking OR have quit w/in 15years.) does  not qualify.   Lung Cancer Screening Referral: no  Additional Screening:  Hepatitis C Screening: does qualify; Completed yes  Vision Screening: Recommended annual ophthalmology exams for early detection of glaucoma and other disorders of the eye. Is the patient up to date with their annual eye exam?  Yes  Who is the provider or what is the name of the office in which the  patient attends annual eye exams? Ander PurpuraJohn Jones, OD. If pt is not established with a provider, would they like to be referred to a provider to establish care? No .   Dental Screening: Recommended annual dental exams for proper oral hygiene  Community Resource Referral / Chronic Care Management: CRR required this visit?  No   CCM required this visit?  No      Plan:     I have personally reviewed and noted the following in the patients chart:   Medical and social history Use of alcohol, tobacco or illicit drugs  Current medications and supplements including opioid prescriptions.  Functional ability and status Nutritional status Physical activity Advanced directives List of other physicians Hospitalizations, surgeries, and ER visits in previous 12 months Vitals Screenings to include cognitive, depression, and falls Referrals and appointments  In addition, I have reviewed and discussed with patient certain preventive protocols, quality metrics, and best practice recommendations. A written personalized care plan for preventive services as well as general preventive health recommendations were provided to patient.     Mickeal NeedyShenika N Margaret Cockerill, LPN   1/19/14781/27/2023   Nurse Notes:  Patient is cogitatively intact. There were no vitals filed for this visit. There is no height or weight on file to calculate BMI.

## 2021-04-03 ENCOUNTER — Other Ambulatory Visit: Payer: Self-pay

## 2021-04-03 ENCOUNTER — Ambulatory Visit (HOSPITAL_COMMUNITY)
Admission: RE | Admit: 2021-04-03 | Discharge: 2021-04-03 | Disposition: A | Discharge status: Home / Self Care | Payer: Medicare HMO | Source: Ambulatory Visit | Attending: Surgery | Admitting: Surgery

## 2021-04-03 ENCOUNTER — Ambulatory Visit: Payer: Medicare HMO | Admitting: Physician Assistant

## 2021-04-03 VITALS — BP 121/73 | HR 74 | Temp 98.0°F | Resp 20 | Ht 59.0 in | Wt 164.8 lb

## 2021-04-03 DIAGNOSIS — M79661 Pain in right lower leg: Secondary | ICD-10-CM

## 2021-04-03 DIAGNOSIS — I872 Venous insufficiency (chronic) (peripheral): Secondary | ICD-10-CM | POA: Diagnosis not present

## 2021-04-03 DIAGNOSIS — M79662 Pain in left lower leg: Secondary | ICD-10-CM

## 2021-04-03 DIAGNOSIS — M7989 Other specified soft tissue disorders: Secondary | ICD-10-CM | POA: Diagnosis not present

## 2021-04-03 NOTE — Progress Notes (Signed)
Office Note     CC:  follow up Requesting Provider:  Corwin Levins, MD  HPI: [name] is a 76 y.o. ([birth year]) female who presents for evaluation of right lower extremity edema and pain.  Over the past several months she has noticed an increase in edema of her right lower leg as well as pain with pigmentation changes.  She wears knee-high compression regularly however admittedly does not elevate her legs or avoid prolonged sitting during the day.  She denies any history of DVT, venous ulcerations, or prior vascular intervention.  1 month ago she dislocated her right hip after standing up awkwardly from a sitting position however this was reduced without surgery.  She denies tobacco use.  She is ambulatory without assistance.  Past Medical History:  Diagnosis Date   Allergic rhinitis, cause unspecified 12/26/2010   Cataract    1 eye-? which   DEGENERATIVE JOINT DISEASE 11/25/2006   in knees    DEPRESSION 11/25/2006   DIVERTICULOSIS, COLON 12/04/2006   DIZZINESS 11/18/2009   DJD (degenerative joint disease)    Bilateral Hip   HYPERCHOLESTEROLEMIA 11/25/2006   HYPERLIPIDEMIA 11/18/2009   HYPERPARATHYROIDISM, HX OF 11/25/2006   HYPERTENSION 07/15/2007   HYPOTHYROIDISM 11/25/2006   OBESITY 12/04/2006   OSTEOPENIA 11/25/2006   OTITIS MEDIA, ACUTE, LEFT 11/18/2009   PALPITATIONS, RECURRENT 07/20/2008   POSTMENOPAUSAL STATUS 11/25/2006   PULMONARY NODULE 11/25/2006   Vitamin D deficiency     Past Surgical History:  Procedure Laterality Date   ABDOMINAL HYSTERECTOMY     COLONOSCOPY     left knee     PARATHYROIDECTOMY Right 11/20/2014   Procedure: RIGHT PARATHYROIDECTOMY;  Surgeon: Darnell Level, MD;  Location: [facility] OR;  Service: General;  Laterality: Right;   POLYPECTOMY     right wrist     THYROID SURGERY     early 70's   TOTAL HIP ARTHROPLASTY     left 3/05, right 2002   TOTAL HIP ARTHROPLASTY Left 2005   TOTAL KNEE ARTHROPLASTY Left 08/26/2020   Procedure: TOTAL KNEE  ARTHROPLASTY;  Surgeon: Ollen Gross, MD;  Location: [facility] ORS;  Service: Orthopedics;  Laterality: Left;    UMBILICAL HERNIA REPAIR      Social History   Socioeconomic History   Marital status: Widowed    Spouse name: Not on file   Number of children: 2   Years of education: Not on file   Highest education level: Not on file  Occupational History   Occupation: retired  Tobacco Use   Smoking status: Former    Years: 1.00    Types: Cigarettes    Quit date: 02/17/1968    Years since quitting: 53.1    Passive exposure: Never   Smokeless tobacco: Former    Quit date: 11/12/1974   Tobacco comments:    tried snuff as a child, casual smoker as young adult  Vaping Use   Vaping Use: Never used  Substance and Sexual Activity   Alcohol use: No    Alcohol/week: 0.0 standard drinks   Drug use: No   Sexual activity: Never  Other Topics Concern   Not on file  Social History Narrative   Not on file   Social Determinants of Health   Financial Resource Strain: Low Risk    Difficulty of Paying Living Expenses: Not hard at all  Food Insecurity: No Food Insecurity   Worried About Programme researcher, broadcasting/film/video in the Last Year: Never true   Barista in  the Last Year: Never true  Transportation Needs: No Transportation Needs   Lack of Transportation (Medical): No   Lack of Transportation (Non-Medical): No  Physical Activity: Sufficiently Active   Days of Exercise per Week: 5 days   Minutes of Exercise per Session: 30 min  Stress: No Stress Concern Present   Feeling of Stress : Not at all  Social Connections: Moderately Integrated   Frequency of Communication with Friends and Family: More than three times a week   Frequency of Social Gatherings with Friends and Family: More than three times a week   Attends Religious Services: More than 4 times per year   Active Member of Clubs or Organizations: No   Attends Engineer, structural: More than 4 times per year   Marital Status:  Widowed  Catering manager Violence: Not At Risk   Fear of Current or Ex-Partner: No   Emotionally Abused: No   Physically Abused: No   Sexually Abused: No    Family History  Problem Relation Age of Onset   Breast cancer Mother    Stroke Father    Hypertension Other    Colon cancer Neg Hx    Esophageal cancer Neg Hx    Rectal cancer Neg Hx    Stomach cancer Neg Hx     Current Outpatient Medications  Medication Sig Dispense Refill   alendronate (FOSAMAX) 70 MG tablet TAKE 1 TABLET ONCE EVERY WEEK (ON SUNDAY MORNINGS) WITH A FULL GLASS OF WATER ON AN EMPTY STOMACH (Patient taking differently: Take 70 mg by mouth once a week. Sunday's) 12 tablet 0   aspirin 325 MG EC tablet Take 1 tablet (325 mg total) by mouth daily. (Patient taking differently: Take 325 mg by mouth daily as needed for pain (headache).) 90 tablet 3   cholecalciferol (VITAMIN D) 1000 UNITS tablet Take 1,000 Units by mouth daily.     furosemide (LASIX) 20 MG tablet Take 1 tablet (20 mg total) by mouth daily as needed. 30 tablet 3   levothyroxine (SYNTHROID) 75 MCG tablet TAKE 1 TABLET EVERY DAY (Patient taking differently: Take 75 mcg by mouth daily.) 90 tablet 3   lovastatin (MEVACOR) 20 MG tablet TAKE 1 TABLET EVERY DAY (Patient taking differently: Take 20 mg by mouth daily.) 90 tablet 3   psyllium (HYDROCIL/METAMUCIL) 95 % PACK Take 1 packet by mouth daily as needed (constipation).      valsartan (DIOVAN) 80 MG tablet TAKE 1 TABLET EVERY DAY (Patient taking differently: Take 80 mg by mouth daily.) 90 tablet 2   No current facility-administered medications for this visit.    No Known Allergies   REVIEW OF SYSTEMS:   [X]  denotes positive finding, [ ]  denotes negative finding Cardiac  Comments:  Chest pain or chest pressure:    Shortness of breath upon exertion:    Short of breath when lying flat:    Irregular heart rhythm:        Vascular    Pain in calf, thigh, or hip brought on by ambulation:    Pain in  feet at night that wakes you up from your sleep:     Blood clot in your veins:    Leg swelling:         Pulmonary    Oxygen at home:    Productive cough:     Wheezing:         Neurologic    Sudden weakness in arms or legs:     Sudden numbness in  arms or legs:     Sudden onset of difficulty speaking or slurred speech:    Temporary loss of vision in one eye:     Problems with dizziness:         Gastrointestinal    Blood in stool:     Vomited blood:         Genitourinary    Burning when urinating:     Blood in urine:        Psychiatric    Major depression:         Hematologic    Bleeding problems:    Problems with blood clotting too easily:        Skin    Rashes or ulcers:        Constitutional    Fever or chills:      PHYSICAL EXAMINATION:  Vitals:   04/03/21 1457  BP: 121/73  Pulse: 74  Resp: 20  Temp: 98 F (36.7 C)  TempSrc: Temporal  SpO2: 98%  Weight: 164 lb 12.8 oz (74.8 kg)  Height: 4\' 11"  (1.499 m)    General:  WDWN in NAD; vital signs documented above Gait: Not observed HENT: WNL, normocephalic Pulmonary: normal non-labored breathing , without Rales, rhonchi,  wheezing Cardiac: regular HR Abdomen: soft, NT, no masses Skin: without rashes Vascular Exam/Pulses:  Right Left  Radial 2+ (normal) 2+ (normal)  DP 2+ (normal) 2+ (normal)   Extremities: without ischemic changes, without Gangrene , without cellulitis; without open wounds; hyperpigmentation around R ankle; induration medial and posterior distal calf Musculoskeletal: no muscle wasting or atrophy  Neurologic: A&O X 3;  No focal weakness or paresthesias are detected Psychiatric:  The pt has Normal affect.   Non-Invasive Vascular Imaging:   Right lower extremity venous reflux study negative for DVT Deep venous reflux noted in the common femoral vein, femoral vein, and popliteal vein Incompetent saphenofemoral junction Incompetent small saphenous vein with chronic nonocclusive  thrombus with diameter of 4.8 to 6.8 mm    ASSESSMENT/PLAN:: 76 y.o. female here for evaluation of right lower extremity edema, pain, venous skin changes  -Right lower extremity venous reflux studies negative for DVT however she does have deep venous reflux from the common femoral vein to the popliteal vein.  She also has an incompetent small saphenous vein with diameter between 4.8 and 6.8 mm.  There is a chronic nonocclusive thrombus in the small saphenous vein near the popliteal fossa. -Recommendations included thigh-high 20 to 30 mmHg compression to be worn regularly during the day.  She should also elevate her legs above the level of her heart and this was demonstrated.  She should avoid prolonged sitting and standing.  She can use NSAIDs for discomfort associated with venous symptoms.  She will follow-up in 3 months for further evaluation by a vein surgeon to see if she would be a candidate for small saphenous vein ablation.  After our office visit I also spoke with her daughter 62 over the phone and discussed the plan above.   Carollee Herter, PA-C [facility] [phone]  Clinic MD:   [phone] on call

## 2021-04-15 ENCOUNTER — Ambulatory Visit: Payer: Medicare HMO | Admitting: Internal Medicine

## 2021-04-16 ENCOUNTER — Other Ambulatory Visit: Payer: Self-pay

## 2021-04-16 ENCOUNTER — Other Ambulatory Visit: Payer: Self-pay | Admitting: Internal Medicine

## 2021-04-16 ENCOUNTER — Ambulatory Visit (INDEPENDENT_AMBULATORY_CARE_PROVIDER_SITE_OTHER): Payer: Medicare HMO | Admitting: Internal Medicine

## 2021-04-16 ENCOUNTER — Encounter: Payer: Self-pay | Admitting: Internal Medicine

## 2021-04-16 VITALS — BP 124/68 | HR 65 | Resp 18 | Ht 59.0 in | Wt 164.0 lb

## 2021-04-16 DIAGNOSIS — E7800 Pure hypercholesterolemia, unspecified: Secondary | ICD-10-CM | POA: Diagnosis not present

## 2021-04-16 DIAGNOSIS — E559 Vitamin D deficiency, unspecified: Secondary | ICD-10-CM | POA: Diagnosis not present

## 2021-04-16 DIAGNOSIS — E538 Deficiency of other specified B group vitamins: Secondary | ICD-10-CM

## 2021-04-16 DIAGNOSIS — R350 Frequency of micturition: Secondary | ICD-10-CM

## 2021-04-16 DIAGNOSIS — E039 Hypothyroidism, unspecified: Secondary | ICD-10-CM | POA: Diagnosis not present

## 2021-04-16 DIAGNOSIS — R739 Hyperglycemia, unspecified: Secondary | ICD-10-CM | POA: Diagnosis not present

## 2021-04-16 DIAGNOSIS — Z0001 Encounter for general adult medical examination with abnormal findings: Secondary | ICD-10-CM | POA: Diagnosis not present

## 2021-04-16 DIAGNOSIS — I10 Essential (primary) hypertension: Secondary | ICD-10-CM

## 2021-04-16 LAB — URINALYSIS, ROUTINE W REFLEX MICROSCOPIC
Bilirubin Urine: NEGATIVE
Hgb urine dipstick: NEGATIVE
Nitrite: POSITIVE — AB
Specific Gravity, Urine: 1.02 (ref 1.000–1.030)
Total Protein, Urine: NEGATIVE
Urine Glucose: NEGATIVE
Urobilinogen, UA: 4 — AB (ref 0.0–1.0)
pH: 6 (ref 5.0–8.0)

## 2021-04-16 LAB — CBC WITH DIFFERENTIAL/PLATELET
Basophils Absolute: 0.1 10*3/uL (ref 0.0–0.1)
Basophils Relative: 2.7 % (ref 0.0–3.0)
Eosinophils Absolute: 0.1 10*3/uL (ref 0.0–0.7)
Eosinophils Relative: 3 % (ref 0.0–5.0)
HCT: 36.3 % (ref 36.0–46.0)
Hemoglobin: 11.8 g/dL — ABNORMAL LOW (ref 12.0–15.0)
Lymphocytes Relative: 18.3 % (ref 12.0–46.0)
Lymphs Abs: 0.7 10*3/uL (ref 0.7–4.0)
MCHC: 32.6 g/dL (ref 30.0–36.0)
MCV: 89.1 fl (ref 78.0–100.0)
Monocytes Absolute: 0.5 10*3/uL (ref 0.1–1.0)
Monocytes Relative: 12.8 % — ABNORMAL HIGH (ref 3.0–12.0)
Neutro Abs: 2.4 10*3/uL (ref 1.4–7.7)
Neutrophils Relative %: 63.2 % (ref 43.0–77.0)
Platelets: 266 10*3/uL (ref 150.0–400.0)
RBC: 4.07 Mil/uL (ref 3.87–5.11)
RDW: 14.6 % (ref 11.5–15.5)
WBC: 3.9 10*3/uL — ABNORMAL LOW (ref 4.0–10.5)

## 2021-04-16 LAB — LIPID PANEL
Cholesterol: 178 mg/dL (ref 0–200)
HDL: 60.5 mg/dL (ref >39.00)
LDL Cholesterol: 107 mg/dL — ABNORMAL HIGH (ref 0–99)
NonHDL: 117.42
Total CHOL/HDL Ratio: 3
Triglycerides: 53 mg/dL (ref 0.0–149.0)
VLDL: 10.6 mg/dL (ref 0.0–40.0)

## 2021-04-16 LAB — BASIC METABOLIC PANEL
BUN: 14 mg/dL (ref 6–23)
CO2: 31 mEq/L (ref 19–32)
Calcium: 9.2 mg/dL (ref 8.4–10.5)
Chloride: 103 mEq/L (ref 96–112)
Creatinine, Ser: 0.72 mg/dL (ref 0.40–1.20)
GFR: 80.52 mL/min (ref >60.00)
Glucose, Bld: 80 mg/dL (ref 70–99)
Potassium: 3.8 mEq/L (ref 3.5–5.1)
Sodium: 139 mEq/L (ref 135–145)

## 2021-04-16 LAB — HEMOGLOBIN A1C: Hgb A1c MFr Bld: 5.9 % (ref 4.6–6.5)

## 2021-04-16 LAB — HEPATIC FUNCTION PANEL
ALT: 7 U/L (ref 0–35)
AST: 15 U/L (ref 0–37)
Albumin: 3.9 g/dL (ref 3.5–5.2)
Alkaline Phosphatase: 43 U/L (ref 39–117)
Bilirubin, Direct: 0.1 mg/dL (ref 0.0–0.3)
Total Bilirubin: 0.4 mg/dL (ref 0.2–1.2)
Total Protein: 7.4 g/dL (ref 6.0–8.3)

## 2021-04-16 LAB — TSH: TSH: 1.06 u[IU]/mL (ref 0.35–5.50)

## 2021-04-16 LAB — VITAMIN B12: Vitamin B-12: 230 pg/mL (ref 211–911)

## 2021-04-16 LAB — VITAMIN D 25 HYDROXY (VIT D DEFICIENCY, FRACTURES): VITD: 59.4 ng/mL (ref 30.00–100.00)

## 2021-04-16 MED ORDER — CEPHALEXIN 500 MG PO CAPS: 500.0000 mg | ORAL_CAPSULE | Freq: Three times a day (TID) | ORAL | 0 refills | Status: DC

## 2021-04-16 NOTE — Patient Instructions (Signed)

## 2021-04-16 NOTE — Progress Notes (Signed)
Patient ID: [name], female   DOB: April 15, 1945, 76 y.o.   MRN: [id number]         Chief Complaint:: wellness exam and 3 month f/u  With urinary frequency, htn, hyperglycemia,hld, hypothyroid       HPI:  [name] is a 76 y.o. female here for wellness exam; declines shingrix, pneumovax, tdap o/w up to date                        Also Pt denies chest pain, increased sob or doe, wheezing, orthopnea, PND, increased LE swelling, palpitations, dizziness or syncope.   Pt denies polydipsia, polyuria, or new focal neuro s/s.   Pt denies fever, wt loss, night sweats, loss of appetite, or other constitutional symptoms  Denies hyper or hypo thyroid symptoms such as voice, skin or hair change.  Denies urinary symptoms such as dysuria, urgency, flank pain, hematuria or n/v, fever, chills, but has 3 days onset midl urinary frequency without increased fluids.  .   Wt Readings from Last 3 Encounters:  04/16/21 164 lb (74.4 kg)  04/03/21 164 lb 12.8 oz (74.8 kg)  03/05/21 167 lb 8.8 oz (76 kg)   BP Readings from Last 3 Encounters:  04/16/21 124/68  04/03/21 121/73  03/05/21 113/75   Immunization History  Administered Date(s) Administered   Moderna Sars-Covid-2 Vaccination 03/22/2019, 03/31/2019, 04/12/2019, 04/28/2019, 01/23/2020   Td 07/21/1995, 11/18/2009  There are no preventive care reminders to display for this patient.    Past Medical History:  Diagnosis Date   Allergic rhinitis, cause unspecified 12/26/2010   Cataract    1 eye-? which   DEGENERATIVE JOINT DISEASE 11/25/2006   in knees    DEPRESSION 11/25/2006   DIVERTICULOSIS, COLON 12/04/2006   DIZZINESS 11/18/2009   DJD (degenerative joint disease)    Bilateral Hip   HYPERCHOLESTEROLEMIA 11/25/2006   HYPERLIPIDEMIA 11/18/2009   HYPERPARATHYROIDISM, HX OF 11/25/2006   HYPERTENSION 07/15/2007   HYPOTHYROIDISM 11/25/2006   OBESITY 12/04/2006   OSTEOPENIA 11/25/2006   OTITIS MEDIA, ACUTE, LEFT 11/18/2009   PALPITATIONS,  RECURRENT 07/20/2008   POSTMENOPAUSAL STATUS 11/25/2006   PULMONARY NODULE 11/25/2006   Vitamin D deficiency    Past Surgical History:  Procedure Laterality Date   ABDOMINAL HYSTERECTOMY     COLONOSCOPY     left knee     PARATHYROIDECTOMY Right 11/20/2014   Procedure: RIGHT PARATHYROIDECTOMY;  Surgeon: Darnell Level, MD;  Location: [facility] OR;  Service: General;  Laterality: Right;   POLYPECTOMY     right wrist     THYROID SURGERY     early 70's   TOTAL HIP ARTHROPLASTY     left 3/05, right 2002   TOTAL HIP ARTHROPLASTY Left 2005   TOTAL KNEE ARTHROPLASTY Left 08/26/2020   Procedure: TOTAL KNEE ARTHROPLASTY;  Surgeon: Ollen Gross, MD;  Location: [facility] ORS;  Service: Orthopedics;  Laterality: Left;    UMBILICAL HERNIA REPAIR      reports that she quit smoking about 53 years ago. Her smoking use included cigarettes. She has never been exposed to tobacco smoke. She quit smokeless tobacco use about 46 years ago. She reports that she does not drink alcohol and does not use drugs. family history includes Breast cancer in her mother; Hypertension in an other family member; Stroke in her father. No Known Allergies Current Outpatient Medications on File Prior to Visit  Medication Sig Dispense Refill   alendronate (FOSAMAX) 70 MG tablet TAKE 1 TABLET ONCE  EVERY WEEK (ON SUNDAY MORNINGS) WITH A FULL GLASS OF WATER ON AN EMPTY STOMACH (Patient taking differently: Take 70 mg by mouth once a week. Sunday's) 12 tablet 0   aspirin 325 MG EC tablet Take 1 tablet (325 mg total) by mouth daily. (Patient taking differently: Take 325 mg by mouth daily as needed for pain (headache).) 90 tablet 3   cholecalciferol (VITAMIN D) 1000 UNITS tablet Take 1,000 Units by mouth daily.     furosemide (LASIX) 20 MG tablet Take 1 tablet (20 mg total) by mouth daily as needed. 30 tablet 3   levothyroxine (SYNTHROID) 75 MCG tablet TAKE 1 TABLET EVERY DAY (Patient taking differently: Take 75 mcg by mouth daily.) 90 tablet 3    lovastatin (MEVACOR) 20 MG tablet TAKE 1 TABLET EVERY DAY (Patient taking differently: Take 20 mg by mouth daily.) 90 tablet 3   psyllium (HYDROCIL/METAMUCIL) 95 % PACK Take 1 packet by mouth daily as needed (constipation).      valsartan (DIOVAN) 80 MG tablet TAKE 1 TABLET EVERY DAY (Patient taking differently: Take 80 mg by mouth daily.) 90 tablet 2   No current facility-administered medications on file prior to visit.        ROS:  All others reviewed and negative.  Objective        PE:  BP 124/68   Pulse 65   Resp 18   Ht 4\' 11"  (1.499 m)   Wt 164 lb (74.4 kg)   SpO2 98%   BMI 33.12 kg/m                 Constitutional: Pt appears in NAD               HENT: Head: NCAT.                Right Ear: External ear normal.                 Left Ear: External ear normal.                Eyes: . Pupils are equal, round, and reactive to light. Conjunctivae and EOM are normal               Nose: without d/c or deformity               Neck: Neck supple. Gross normal ROM               Cardiovascular: Normal rate and regular rhythm.                 Pulmonary/Chest: Effort normal and breath sounds without rales or wheezing.                Abd:  Soft, NT, ND, + BS, no organomegaly               Neurological: Pt is alert. At baseline orientation, motor grossly intact               Skin: Skin is warm. No rashes, no other new lesions, LE edema - none               Psychiatric: Pt behavior is normal without agitation   Micro: none  Cardiac tracings I have personally interpreted today:  none  Pertinent Radiological findings (summarize): none   Lab Results  Component Value Date   WBC 3.9 (L) 04/16/2021   HGB 11.8 (L) 04/16/2021   HCT 36.3 04/16/2021   PLT  266.0 04/16/2021   GLUCOSE 80 04/16/2021   CHOL 178 04/16/2021   TRIG 53.0 04/16/2021   HDL 60.50 04/16/2021   LDLDIRECT 118.5 01/01/2012   LDLCALC 107 (H) 04/16/2021   ALT 7 04/16/2021   AST 15 04/16/2021   NA 139 04/16/2021   K  3.8 04/16/2021   CL 103 04/16/2021   CREATININE 0.72 04/16/2021   BUN 14 04/16/2021   CO2 31 04/16/2021   TSH 1.06 04/16/2021   INR 1.1 08/16/2020   HGBA1C 5.9 04/16/2021   Assessment/Plan:  [name] is a 76 y.o. Black or African American [2] female with  has a past medical history of Allergic rhinitis, cause unspecified (12/26/2010), Cataract, DEGENERATIVE JOINT DISEASE (11/25/2006), DEPRESSION (11/25/2006), DIVERTICULOSIS, COLON (12/04/2006), DIZZINESS (11/18/2009), DJD (degenerative joint disease), HYPERCHOLESTEROLEMIA (11/25/2006), HYPERLIPIDEMIA (11/18/2009), HYPERPARATHYROIDISM, HX OF (11/25/2006), HYPERTENSION (07/15/2007), HYPOTHYROIDISM (11/25/2006), OBESITY (12/04/2006), OSTEOPENIA (11/25/2006), OTITIS MEDIA, ACUTE, LEFT (11/18/2009), PALPITATIONS, RECURRENT (07/20/2008), POSTMENOPAUSAL STATUS (11/25/2006), PULMONARY NODULE (11/25/2006), and Vitamin D deficiency.  Encounter for well adult exam with abnormal findings Age and sex appropriate education and counseling updated with regular exercise and diet Referrals for preventative services - none needed Immunizations addressed - declines shingrix, pneumovax, tdap Smoking counseling  - none needed Evidence for depression or other mood disorder - none significant Most recent labs reviewed. I have personally reviewed and have noted: 1) the patient's medical and social history 2) The patient's current medications and supplements 3) The patient's height, weight, and BMI have been recorded in the chart   Essential hypertension BP Readings from Last 3 Encounters:  04/16/21 124/68  04/03/21 121/73  03/05/21 113/75   Stable, pt to continue medical treatment diovan   Hyperglycemia Lab Results  Component Value Date   HGBA1C 5.9 04/16/2021   Stable, pt to continue current medical treatment - diet   Hyperlipidemia Lab Results  Component Value Date   LDLCALC 107 (H) 04/16/2021   Uncontrolled, goal ldl < 70, pt to  continue current statin lovasatatin 20, declines change for now, for lower chol diet   Hypothyroidism Lab Results  Component Value Date   TSH 1.06 04/16/2021   Stable, pt to continue levothyroxine   Urinary frequency Mild, cant r/o uti - for UA  Followup: Return in about 6 months (around 10/17/2021).  Oliver Barre, MD 04/17/2021 9:30 PM [facility]

## 2021-04-17 ENCOUNTER — Encounter: Payer: Self-pay | Admitting: Internal Medicine

## 2021-04-17 DIAGNOSIS — R350 Frequency of micturition: Secondary | ICD-10-CM | POA: Insufficient documentation

## 2021-04-17 NOTE — Assessment & Plan Note (Signed)
Age and sex appropriate education and counseling updated with regular exercise and diet ?Referrals for preventative services - none needed ?Immunizations addressed - declines shingrix, pneumovax, tdap ?Smoking counseling  - none needed ?Evidence for depression or other mood disorder - none significant ?Most recent labs reviewed. ?I have personally reviewed and have noted: ?1) the patient's medical and social history ?2) The patient's current medications and supplements ?3) The patient's height, weight, and BMI have been recorded in the chart ? ?

## 2021-04-17 NOTE — Assessment & Plan Note (Signed)
Lab Results  ?Component Value Date  ? HGBA1C 5.9 04/16/2021  ? ?Stable, pt to continue current medical treatment - diet ? ?

## 2021-04-17 NOTE — Assessment & Plan Note (Signed)
BP Readings from Last 3 Encounters:  ?04/16/21 124/68  ?04/03/21 121/73  ?03/05/21 113/75  ? ?Stable, pt to continue medical treatment diovan ? ?

## 2021-04-17 NOTE — Assessment & Plan Note (Signed)
Lab Results  ?Component Value Date  ? TSH 1.06 04/16/2021  ? ?Stable, pt to continue levothyroxine ? ?

## 2021-04-17 NOTE — Assessment & Plan Note (Signed)
Lab Results  ?Component Value Date  ? LDLCALC 107 (H) 04/16/2021  ? ?Uncontrolled, goal ldl < 70, pt to continue current statin lovasatatin 20, declines change for now, for lower chol diet ? ?

## 2021-04-17 NOTE — Assessment & Plan Note (Signed)
Mild, cant r/o uti - for UA ?

## 2021-04-30 ENCOUNTER — Other Ambulatory Visit: Payer: Self-pay | Admitting: Internal Medicine

## 2021-04-30 NOTE — Telephone Encounter (Signed)
Please refill as per office routine med refill policy (all routine meds to be refilled for 3 mo or monthly (per pt preference) up to one year from last visit, then month to month grace period for 3 mo, then further med refills will have to be denied) ? ?

## 2021-05-21 ENCOUNTER — Other Ambulatory Visit: Payer: Self-pay | Admitting: Internal Medicine

## 2021-06-23 ENCOUNTER — Other Ambulatory Visit: Payer: Self-pay | Admitting: Internal Medicine

## 2021-06-23 NOTE — Telephone Encounter (Signed)
Please refill as per office routine med refill policy (all routine meds to be refilled for 3 mo or monthly (per pt preference) up to one year from last visit, then month to month grace period for 3 mo, then further med refills will have to be denied) ? ?

## 2021-06-30 ENCOUNTER — Encounter: Payer: Self-pay | Admitting: Surgery

## 2021-06-30 ENCOUNTER — Ambulatory Visit: Payer: Medicare HMO | Admitting: Surgery

## 2021-06-30 VITALS — BP 113/66 | HR 87 | Temp 98.1°F | Resp 20 | Ht 59.0 in | Wt 160.9 lb

## 2021-06-30 DIAGNOSIS — I83893 Varicose veins of bilateral lower extremities with other complications: Secondary | ICD-10-CM

## 2021-06-30 NOTE — Progress Notes (Signed)
? ?[facility] ? ?Patient name: [name] MRN: [id number] DOB: 09/12/1945 Sex: female ? ? ?REASON FOR VISIT:  ? ? ?Follow up ? ?HISOTRY OF PRESENT ILLNESS:  ? ? ?[name] is a 76 y.o. female who returns today for follow-up.  She saw Aggie Moats in February 2023 for right leg swelling and pain with pigmentation changes.  She has been wearing compression socks, however she has difficulty getting the 20-30 socks on..  She has not had any history of DVT or venous ulcers.  She has not had any prior vascular procedures. ? ?She is medically managed for hypertension.  She takes a statin for hypercholesterolemia.  She walks with a walker.  She is a former smoker. ? ?PAST MEDICAL HISTORY:  ? ?Past Medical History:  ?Diagnosis Date  ? Allergic rhinitis, cause unspecified 12/26/2010  ? Cataract   ? 1 eye-? which  ? DEGENERATIVE JOINT DISEASE 11/25/2006  ? in knees   ? DEPRESSION 11/25/2006  ? DIVERTICULOSIS, COLON 12/04/2006  ? DIZZINESS 11/18/2009  ? DJD (degenerative joint disease)   ? Bilateral Hip  ? HYPERCHOLESTEROLEMIA 11/25/2006  ? HYPERLIPIDEMIA 11/18/2009  ? HYPERPARATHYROIDISM, HX OF 11/25/2006  ? HYPERTENSION 07/15/2007  ? HYPOTHYROIDISM 11/25/2006  ? OBESITY 12/04/2006  ? OSTEOPENIA 11/25/2006  ? OTITIS MEDIA, ACUTE, LEFT 11/18/2009  ? PALPITATIONS, RECURRENT 07/20/2008  ? POSTMENOPAUSAL STATUS 11/25/2006  ? PULMONARY NODULE 11/25/2006  ? Vitamin D deficiency   ? ? ? ?FAMILY HISTORY:  ? ?Family History  ?Problem Relation Age of Onset  ? Breast cancer Mother   ? Stroke Father   ? Hypertension Other   ? Colon cancer Neg Hx   ? Esophageal cancer Neg Hx   ? Rectal cancer Neg Hx   ? Stomach cancer Neg Hx   ? ? ?SOCIAL HISTORY:  ? ?Social History  ? ?Tobacco Use  ? Smoking status: Former  ?  Years: 1.00  ?  Types: Cigarettes  ?  Quit date: 02/17/1968  ?  Years since quitting: 53.4  ?  Passive exposure: Never  ? Smokeless tobacco: Former  ?  Quit  date: 11/12/1974  ? Tobacco comments:  ?  tried snuff as a child, casual smoker as young adult  ?Substance Use Topics  ? Alcohol use: No  ?  Alcohol/week: 0.0 standard drinks  ? ? ? ?ALLERGIES:  ? ?No Known Allergies ? ? ?CURRENT MEDICATIONS:  ? ?Current Outpatient Medications  ?Medication Sig Dispense Refill  ? alendronate (FOSAMAX) 70 MG tablet TAKE 1 TABLET ONCE EVERY WEEK (ON SUNDAY MORNINGS) WITH A FULL GLASS OF WATER ON AN EMPTY STOMACH 12 tablet 2  ? aspirin 325 MG EC tablet Take 1 tablet (325 mg total) by mouth daily. (Patient taking differently: Take 325 mg by mouth daily as needed for pain (headache).) 90 tablet 3  ? cephALEXin (KEFLEX) 500 MG capsule Take 1 capsule (500 mg total) by mouth 3 (three) times daily. 30 capsule 0  ? cholecalciferol (VITAMIN D) 1000 UNITS tablet Take 1,000 Units by mouth daily.    ? furosemide (LASIX) 20 MG tablet TAKE 1 TABLET(20 MG) BY MOUTH DAILY AS NEEDED 30 tablet 5  ? levothyroxine (SYNTHROID) 75 MCG tablet TAKE 1 TABLET EVERY DAY (Patient taking differently: Take 75 mcg by mouth daily.) 90 tablet 3  ? lovastatin (MEVACOR) 20 MG tablet TAKE 1 TABLET EVERY DAY (Patient taking differently: Take 20 mg by mouth daily.) 90 tablet 3  ? psyllium (HYDROCIL/METAMUCIL) 95 %  PACK Take 1 packet by mouth daily as needed (constipation).     ? valsartan (DIOVAN) 80 MG tablet TAKE 1 TABLET EVERY DAY 90 tablet 3  ? ?No current facility-administered medications for this visit.  ? ? ?REVIEW OF SYSTEMS:  ? ?[X]  denotes positive finding, [ ]  denotes negative finding ?Cardiac  Comments:  ?Chest pain or chest pressure:    ?Shortness of breath upon exertion:    ?Short of breath when lying flat:    ?Irregular heart rhythm:    ?    ?Vascular    ?Pain in calf, thigh, or hip brought on by ambulation:    ?Pain in feet at night that wakes you up from your sleep:     ?Blood clot in your veins:    ?Leg swelling:  x   ?    ?Pulmonary    ?Oxygen at home:    ?Productive cough:     ?Wheezing:     ?     ?Neurologic    ?Sudden weakness in arms or legs:     ?Sudden numbness in arms or legs:     ?Sudden onset of difficulty speaking or slurred speech:    ?Temporary loss of vision in one eye:     ?Problems with dizziness:     ?    ?Gastrointestinal    ?Blood in stool:     ?Vomited blood:     ?    ?Genitourinary    ?Burning when urinating:     ?Blood in urine:    ?    ?Psychiatric    ?Major depression:     ?    ?Hematologic    ?Bleeding problems:    ?Problems with blood clotting too easily:    ?    ?Skin    ?Rashes or ulcers:    ?    ?Constitutional    ?Fever or chills:    ? ? ?PHYSICAL EXAM:  ? ?Vitals:  ? 06/30/21 1001  ?BP: 113/66  ?Pulse: 87  ?Resp: 20  ?Temp: 98.1 ?F (36.7 ?C)  ?TempSrc: Temporal  ?SpO2: 99%  ?Weight: 160 lb 14.4 oz (73 kg)  ?Height: 4\' 11"  (1.499 m)  ? ? ?GENERAL: The patient is a well-nourished female, in no acute distress. The vital signs are documented above. ?CARDIAC: There is a regular rate and rhythm.  ?VASCULAR: Bilateral lower extremity edema with hyperpigmentation's around the ankle.  I evaluated her small saphenous on the right with the SonoSite.  This is very dilated with multiple branches.  It is straight before diving down to the popliteal vein.  She also has a very tender prominent varicosity on her calf ?PULMONARY: Non-labored respirations ?MUSCULOSKELETAL: There are no major deformities or cyanosis. ?NEUROLOGIC: No focal weakness or paresthesias are detected. ?SKIN: There are no ulcers or rashes noted. ?PSYCHIATRIC: The patient has a normal affect. ? ? ?STUDIES:  ? ?I have reviewed the following reflux study: ?   ?Venous Reflux Times  ?+--------------+---------+------+-----------+------------+----------------+  ? ?RIGHT         Reflux NoRefluxReflux TimeDiameter cmsComments          ?  ?                        Yes                                           ?  ?+--------------+---------+------+-----------+------------+----------------+  ? ?  CFV                     yes    >1 second                               ?  ?+--------------+---------+------+-----------+------------+----------------+  ? ?FV mid                  yes   >1 second                               ?  ?+--------------+---------+------+-----------+------------+----------------+  ? ?Popliteal               yes   >1 second                               ?  ?+--------------+---------+------+-----------+------------+----------------+  ? ?GSV at [facility]              yes    >500 ms      0.52                      ?  ?+--------------+---------+------+-----------+------------+----------------+  ? ?GSV prox thighno                            0.24                      ?  ?+--------------+---------+------+-----------+------------+----------------+  ? ?GSV mid thigh no                            0.24                      ?  ?+--------------+---------+------+-----------+------------+----------------+  ? ?GSV dist thighno                            0.16                      ?  ?+--------------+---------+------+-----------+------------+----------------+  ? ?GSV at knee   no                            0.21                      ?  ?+--------------+---------+------+-----------+------------+----------------+  ? ?GSV prox calf no                            0.18                      ?  ?+--------------+---------+------+-----------+------------+----------------+  ? ?SSV Pop Fossa           yes    >500 ms      0.68    chronic  ?thrombus  ?+--------------+---------+------+-----------+------------+----------------+  ? ?SSV prox calf           yes    >500 ms      0.48                      ?  ?+--------------+---------+------+-----------+------------+----------------+  ? ?  SSV mid calf            yes    >500 ms      0.28                      ?  ?+--------------+---------+------+-----------+------------+----------------+  ? ? ?MEDICAL ISSUES:  ? ?CEAP class 4: I discussed  with the patient that she has both deep and superficial venous reflux.  Her biggest issue is tenderness from a varicosity on the posterior side of her right calf.  I have recommended proceeding with endovenous laser ablation of the right small saphenous vein and 10-20 stabs of her prominent varicosity.  I t

## 2021-07-22 ENCOUNTER — Other Ambulatory Visit: Payer: Self-pay | Admitting: *Deleted

## 2021-07-22 DIAGNOSIS — I83811 Varicose veins of right lower extremities with pain: Secondary | ICD-10-CM

## 2021-08-11 ENCOUNTER — Other Ambulatory Visit: Payer: Self-pay | Admitting: Internal Medicine

## 2021-08-11 NOTE — Telephone Encounter (Signed)
Please refill as per office routine med refill policy (all routine meds to be refilled for 3 mo or monthly (per pt preference) up to one year from last visit, then month to month grace period for 3 mo, then further med refills will have to be denied) ? ?

## 2021-08-27 ENCOUNTER — Other Ambulatory Visit: Payer: Self-pay | Admitting: *Deleted

## 2021-08-27 MED ORDER — LORAZEPAM 1 MG PO TABS: ORAL_TABLET | ORAL | 0 refills | Status: DC

## 2021-09-04 ENCOUNTER — Encounter: Payer: Self-pay | Admitting: Surgery

## 2021-09-04 ENCOUNTER — Ambulatory Visit: Payer: Medicare HMO | Admitting: Surgery

## 2021-09-04 VITALS — BP 113/75 | HR 91 | Temp 98.2°F | Resp 18 | Ht 59.0 in | Wt 165.0 lb

## 2021-09-04 DIAGNOSIS — I83811 Varicose veins of right lower extremities with pain: Secondary | ICD-10-CM

## 2021-09-04 HISTORY — PX: ENDOVENOUS ABLATION SAPHENOUS VEIN W/ LASER: SUR449

## 2021-09-04 NOTE — Progress Notes (Signed)
     Laser Ablation Procedure     Date: 09/04/2021   [name] DOB:01/07/1946  Consent signed: Yes      Surgeon: Coral Else MD   Procedure: Laser Ablation: right Small Saphenous Vein  BP 113/75 (BP Location: Right Arm, Patient Position: Sitting, Cuff Size: Large)   Pulse 91   Temp 98.2 F (36.8 C) (Temporal)   Resp 18   Ht 4\' 11"  (1.499 m)   Wt 165 lb (74.8 kg)   SpO2 99%   BMI 33.33 kg/m   Tumescent Anesthesia: 425 cc 0.9% NaCl with 50 cc Lidocaine HCL 1%  and 15 cc 8.4% NaHCO3  Local Anesthesia: 2 cc Lidocaine HCL and NaHCO3 (ratio 2:1)  7 watts continuous mode     Total energy: 764 Joules    Total time: 109 seconds  Treatment Length 15 cm   Laser Fiber Ref. #    Lot #  [id number]   Stab Phlebectomy: 10-20 Sites: Calf  Patient tolerated procedure well  Notes:  All staff members wore facial masks. [name] took Ativan 1 mh on 09-04-2021 at 7:30 AM.    Description of Procedure:  After marking the course of the secondary varicosities, the patient was placed on the operating table in the prone position, and the right leg was prepped and draped in sterile fashion.   Local anesthetic was administered and under ultrasound guidance the saphenous vein was accessed with a micro needle and guide wire; then the mirco puncture sheath was placed.  A guide wire was inserted saphenopopliteal junction , followed by a 5 french sheath.  The position of the sheath and then the laser fiber below the junction was confirmed using the ultrasound.  Tumescent anesthesia was administered along the course of the saphenous vein using ultrasound guidance. The patient was placed in Trendelenburg position and protective laser glasses were placed on patient and staff, and the laser was fired at 7 watts continuous mode for a total of 764 joules.   For stab phlebectomies, local anesthetic was administered at the previously marked varicosities, and tumescent anesthesia was administered  around the vessels.  Ten to 20 stab wounds were made using the tip of an 11 blade. And using the vein hook, the phlebectomies were performed using a hemostat to avulse the varicosities.  Adequate hemostasis was achieved.     Steri strips were applied to the stab wounds and ABD pads and thigh high compression stockings were applied.  Ace wrap bandages were applied over the phlebectomy sites and at the top of the saphenopopliteal junction. Blood loss was less than 15 cc.  Discharge instructions reviewed with patient and hardcopy of discharge instructions given to patient to take home. The patient ambulated out of the operating room having tolerated the procedure well.      Addendum: The small saphenous vein had multiple branches.  It was cannulated at the level of the gastroc termination.  Laser ablation was performed for 15 cm.  I stopped 2 cm shy of when the vein dive down to the popliteal junction.  10-20 stabs were then performed.  The vein was very friable and I was unable to get out large segments but was able to disrupt the vein.  09-06-2021

## 2021-09-10 ENCOUNTER — Other Ambulatory Visit: Payer: Self-pay

## 2021-09-10 DIAGNOSIS — M79662 Pain in left lower leg: Secondary | ICD-10-CM

## 2021-09-10 DIAGNOSIS — I872 Venous insufficiency (chronic) (peripheral): Secondary | ICD-10-CM

## 2021-09-10 DIAGNOSIS — I83893 Varicose veins of bilateral lower extremities with other complications: Secondary | ICD-10-CM

## 2021-09-22 ENCOUNTER — Ambulatory Visit (INDEPENDENT_AMBULATORY_CARE_PROVIDER_SITE_OTHER): Payer: Medicare HMO | Admitting: Surgery

## 2021-09-22 ENCOUNTER — Ambulatory Visit (HOSPITAL_COMMUNITY)
Admission: RE | Admit: 2021-09-22 | Discharge: 2021-09-22 | Disposition: A | Discharge status: Home / Self Care | Payer: Medicare HMO | Source: Ambulatory Visit | Attending: Surgery | Admitting: Surgery

## 2021-09-22 ENCOUNTER — Encounter: Payer: Self-pay | Admitting: Surgery

## 2021-09-22 VITALS — BP 115/71 | HR 87 | Temp 98.2°F | Resp 20 | Ht 59.0 in | Wt 158.0 lb

## 2021-09-22 DIAGNOSIS — I83811 Varicose veins of right lower extremities with pain: Secondary | ICD-10-CM | POA: Diagnosis not present

## 2021-09-22 DIAGNOSIS — I83893 Varicose veins of bilateral lower extremities with other complications: Secondary | ICD-10-CM

## 2021-09-22 NOTE — Progress Notes (Signed)
Patient name: [name] MRN: [id number] DOB: [birth year] Sex: female  REASON FOR VISIT:     Post op  HISTORY OF PRESENT ILLNESS:   [name] is a 76 y.o. female with CEAP class IV disease in her right calf.  She has both superficial and deep venous reflux as well as tenderness from a varicosity on the posterior side of her right calf.  On 09/04/2021, she underwent endovenous laser ablation of the right small saphenous vein as well as stab phlebectomy.  She states that she is doing well from her procedure.  She still have some edema.  She is now concerned about her left leg as it is swelling and more tender.  CURRENT MEDICATIONS:    Current Outpatient Medications  Medication Sig Dispense Refill   alendronate (FOSAMAX) 70 MG tablet TAKE 1 TABLET ONCE EVERY WEEK (ON SUNDAY MORNINGS) WITH A FULL GLASS OF WATER ON AN EMPTY STOMACH 12 tablet 2   aspirin 325 MG EC tablet Take 1 tablet (325 mg total) by mouth daily. (Patient taking differently: Take 325 mg by mouth daily as needed for pain (headache).) 90 tablet 3   cephALEXin (KEFLEX) 500 MG capsule Take 1 capsule (500 mg total) by mouth 3 (three) times daily. 30 capsule 0   cholecalciferol (VITAMIN D) 1000 UNITS tablet Take 1,000 Units by mouth daily.     furosemide (LASIX) 20 MG tablet TAKE 1 TABLET(20 MG) BY MOUTH DAILY AS NEEDED 30 tablet 5   levothyroxine (SYNTHROID) 75 MCG tablet TAKE 1 TABLET EVERY DAY 90 tablet 3   LORazepam (ATIVAN) 1 MG tablet Take 1 tablet 30 minutes prior to leaving house on day of office surgery.  Bring second tablet with you to office on day of office surgery. 2 tablet 0   lovastatin (MEVACOR) 20 MG tablet TAKE 1 TABLET EVERY DAY (Patient taking differently: Take 20 mg by mouth daily.) 90 tablet 3   psyllium (HYDROCIL/METAMUCIL) 95 % PACK Take 1 packet by mouth daily as needed (constipation).      valsartan (DIOVAN) 80 MG tablet TAKE 1 TABLET EVERY DAY 90 tablet 3   No  current facility-administered medications for this visit.    REVIEW OF SYSTEMS:   [X]  denotes positive finding, [ ]  denotes negative finding Cardiac  Comments:  Chest pain or chest pressure:    Shortness of breath upon exertion:    Short of breath when lying flat:    Irregular heart rhythm:    Constitutional    Fever or chills:      PHYSICAL EXAM:   Vitals:   09/22/21 1003  BP: 115/71  Pulse: 87  Resp: 20  Temp: 98.2 F (36.8 C)  SpO2: 96%  Weight: 158 lb (71.7 kg)  Height: 4\' 11"  (1.499 m)    GENERAL: The patient is a well-nourished female, in no acute distress. The vital signs are documented above. CARDIOVASCULAR: There is a regular rate and rhythm. PULMONARY: Non-labored respirations Incisions healing nicely I evaluated her left saphenous vein with the SonoSite.  It is dilated with multiple branches  STUDIES:   Right:  - Successful ablation of the SSV from the distal calf to within 1.46 cm of  the SPJ.    MEDICAL ISSUES:   Successful laser ablation of the right small saphenous vein with stab phlebectomies for CEAP class IV disease.  I discussed with the patient that she has superficial and deep venous reflux and so I would not expect her edema to completely  resolve.  With regards to her left leg, I will bring her back in 1 to 2 months for formal venous testing and make plans based off of these results.  Charlena Cross, MD, FACS [facility] [phone] Pager [phone]

## 2021-10-17 ENCOUNTER — Ambulatory Visit: Payer: Medicare HMO | Admitting: Internal Medicine

## 2021-10-21 ENCOUNTER — Ambulatory Visit: Payer: Medicare HMO | Admitting: Internal Medicine

## 2021-10-27 ENCOUNTER — Ambulatory Visit (INDEPENDENT_AMBULATORY_CARE_PROVIDER_SITE_OTHER): Payer: Medicare HMO | Admitting: Internal Medicine

## 2021-10-27 VITALS — BP 120/68 | HR 91 | Temp 98.7°F | Ht 59.0 in | Wt 160.0 lb

## 2021-10-27 DIAGNOSIS — E039 Hypothyroidism, unspecified: Secondary | ICD-10-CM

## 2021-10-27 DIAGNOSIS — M79661 Pain in right lower leg: Secondary | ICD-10-CM

## 2021-10-27 DIAGNOSIS — M7989 Other specified soft tissue disorders: Secondary | ICD-10-CM | POA: Diagnosis not present

## 2021-10-27 DIAGNOSIS — R739 Hyperglycemia, unspecified: Secondary | ICD-10-CM | POA: Diagnosis not present

## 2021-10-27 DIAGNOSIS — E7800 Pure hypercholesterolemia, unspecified: Secondary | ICD-10-CM

## 2021-10-27 DIAGNOSIS — I10 Essential (primary) hypertension: Secondary | ICD-10-CM | POA: Diagnosis not present

## 2021-10-27 DIAGNOSIS — E559 Vitamin D deficiency, unspecified: Secondary | ICD-10-CM

## 2021-10-27 LAB — BASIC METABOLIC PANEL
BUN: 13 mg/dL (ref 6–23)
CO2: 31 mEq/L (ref 19–32)
Calcium: 9.4 mg/dL (ref 8.4–10.5)
Chloride: 104 mEq/L (ref 96–112)
Creatinine, Ser: 0.85 mg/dL (ref 0.40–1.20)
GFR: 65.73 mL/min (ref >60.00)
Glucose, Bld: 71 mg/dL (ref 70–99)
Potassium: 4.8 mEq/L (ref 3.5–5.1)
Sodium: 140 mEq/L (ref 135–145)

## 2021-10-27 LAB — URINALYSIS, ROUTINE W REFLEX MICROSCOPIC
Bilirubin Urine: NEGATIVE
Hgb urine dipstick: NEGATIVE
Ketones, ur: NEGATIVE
Nitrite: POSITIVE — AB
RBC / HPF: NONE SEEN (ref 0–?)
Specific Gravity, Urine: 1.015 (ref 1.000–1.030)
Total Protein, Urine: NEGATIVE
Urine Glucose: NEGATIVE
Urobilinogen, UA: 1 (ref 0.0–1.0)
pH: 6.5 (ref 5.0–8.0)

## 2021-10-27 LAB — CBC WITH DIFFERENTIAL/PLATELET
Basophils Absolute: 0.1 10*3/uL (ref 0.0–0.1)
Basophils Relative: 2.9 % (ref 0.0–3.0)
Eosinophils Absolute: 0.1 10*3/uL (ref 0.0–0.7)
Eosinophils Relative: 3 % (ref 0.0–5.0)
HCT: 35.6 % — ABNORMAL LOW (ref 36.0–46.0)
Hemoglobin: 11.9 g/dL — ABNORMAL LOW (ref 12.0–15.0)
Lymphocytes Relative: 14.5 % (ref 12.0–46.0)
Lymphs Abs: 0.6 10*3/uL — ABNORMAL LOW (ref 0.7–4.0)
MCHC: 33.3 g/dL (ref 30.0–36.0)
MCV: 87.6 fl (ref 78.0–100.0)
Monocytes Absolute: 0.4 10*3/uL (ref 0.1–1.0)
Monocytes Relative: 10.9 % (ref 3.0–12.0)
Neutro Abs: 2.8 10*3/uL (ref 1.4–7.7)
Neutrophils Relative %: 68.7 % (ref 43.0–77.0)
Platelets: 250 10*3/uL (ref 150.0–400.0)
RBC: 4.07 Mil/uL (ref 3.87–5.11)
RDW: 13.3 % (ref 11.5–15.5)
WBC: 4 10*3/uL (ref 4.0–10.5)

## 2021-10-27 LAB — HEMOGLOBIN A1C: Hgb A1c MFr Bld: 6.2 % (ref 4.6–6.5)

## 2021-10-27 LAB — VITAMIN D 25 HYDROXY (VIT D DEFICIENCY, FRACTURES): VITD: 55.23 ng/mL (ref 30.00–100.00)

## 2021-10-27 LAB — HEPATIC FUNCTION PANEL
ALT: 8 U/L (ref 0–35)
AST: 17 U/L (ref 0–37)
Albumin: 3.7 g/dL (ref 3.5–5.2)
Alkaline Phosphatase: 47 U/L (ref 39–117)
Bilirubin, Direct: 0.1 mg/dL (ref 0.0–0.3)
Total Bilirubin: 0.4 mg/dL (ref 0.2–1.2)
Total Protein: 7.8 g/dL (ref 6.0–8.3)

## 2021-10-27 LAB — LIPID PANEL
Cholesterol: 160 mg/dL (ref 0–200)
HDL: 55.3 mg/dL (ref >39.00)
LDL Cholesterol: 93 mg/dL (ref 0–99)
NonHDL: 104.61
Total CHOL/HDL Ratio: 3
Triglycerides: 60 mg/dL (ref 0.0–149.0)
VLDL: 12 mg/dL (ref 0.0–40.0)

## 2021-10-27 LAB — TSH: TSH: 1.18 u[IU]/mL (ref 0.35–5.50)

## 2021-10-27 LAB — D-DIMER, QUANTITATIVE: D-Dimer, Quant: 2.08 mcg/mL FEU — ABNORMAL HIGH (ref <0.50)

## 2021-10-27 NOTE — Progress Notes (Unsigned)
Patient ID: [name], female   DOB: 1945-07-25, 77 y.o.   MRN: [id number]        Chief Complaint: follow up right leg pain and swelling       HPI:  [name] is a 76 y.o. female here with hx of recent July 20 endoascular vein laser tx per vascular surgury, and seemed to do well except has 2-3 wks onset right calf /leg pain and swelling that she is surprised seem to get worse afterwards.  Has no knee pain or swelling, recent trauma, and no focal neuro s/s, no falls.  No hx of DVT.  Pt denies chest pain, increased sob or doe, wheezing, orthopnea, PND, other increased LE swelling, palpitations, dizziness or syncope.   Pt denies fever, wt loss, night sweats, loss of appetite, or other constitutional symptoms         Wt Readings from Last 3 Encounters:  10/27/21 160 lb (72.6 kg)  09/22/21 158 lb (71.7 kg)  09/04/21 165 lb (74.8 kg)   BP Readings from Last 3 Encounters:  10/27/21 120/68  09/22/21 115/71  09/04/21 113/75         Past Medical History:  Diagnosis Date   Allergic rhinitis, cause unspecified 12/26/2010   Cataract    1 eye-? which   DEGENERATIVE JOINT DISEASE 11/25/2006   in knees    DEPRESSION 11/25/2006   DIVERTICULOSIS, COLON 12/04/2006   DIZZINESS 11/18/2009   DJD (degenerative joint disease)    Bilateral Hip   HYPERCHOLESTEROLEMIA 11/25/2006   HYPERLIPIDEMIA 11/18/2009   HYPERPARATHYROIDISM, HX OF 11/25/2006   HYPERTENSION 07/15/2007   HYPOTHYROIDISM 11/25/2006   OBESITY 12/04/2006   OSTEOPENIA 11/25/2006   OTITIS MEDIA, ACUTE, LEFT 11/18/2009   PALPITATIONS, RECURRENT 07/20/2008   POSTMENOPAUSAL STATUS 11/25/2006   PULMONARY NODULE 11/25/2006   Vitamin D deficiency    Past Surgical History:  Procedure Laterality Date   ABDOMINAL HYSTERECTOMY     COLONOSCOPY     ENDOVENOUS ABLATION SAPHENOUS VEIN W/ LASER Right 09/04/2021   endovenous laser ablation right small saphenous vein and stab phlebectomy 10-20 incisions right leg by Coral Else MD    left knee     PARATHYROIDECTOMY Right 11/20/2014   Procedure: RIGHT PARATHYROIDECTOMY;  Surgeon: Darnell Level, MD;  Location: [facility] OR;  Service: General;  Laterality: Right;   POLYPECTOMY     right wrist     THYROID SURGERY     early 70's   TOTAL HIP ARTHROPLASTY     left 3/05, right 2002   TOTAL HIP ARTHROPLASTY Left 2005   TOTAL KNEE ARTHROPLASTY Left 08/26/2020   Procedure: TOTAL KNEE ARTHROPLASTY;  Surgeon: Ollen Gross, MD;  Location: [facility] ORS;  Service: Orthopedics;  Laterality: Left;    UMBILICAL HERNIA REPAIR      reports that she quit smoking about 53 years ago. Her smoking use included cigarettes. She has never been exposed to tobacco smoke. She quit smokeless tobacco use about 46 years ago. She reports that she does not drink alcohol and does not use drugs. family history includes Breast cancer in her mother; Hypertension in an other family member; Stroke in her father. No Known Allergies Current Outpatient Medications on File Prior to Visit  Medication Sig Dispense Refill   alendronate (FOSAMAX) 70 MG tablet TAKE 1 TABLET ONCE EVERY WEEK (ON SUNDAY MORNINGS) WITH A FULL GLASS OF WATER ON AN EMPTY STOMACH 12 tablet 2   aspirin 325 MG EC tablet Take 1 tablet (325 mg total) by  mouth daily. (Patient taking differently: Take 325 mg by mouth daily as needed for pain (headache).) 90 tablet 3   cholecalciferol (VITAMIN D) 1000 UNITS tablet Take 1,000 Units by mouth daily.     furosemide (LASIX) 20 MG tablet TAKE 1 TABLET(20 MG) BY MOUTH DAILY AS NEEDED 30 tablet 5   levothyroxine (SYNTHROID) 75 MCG tablet TAKE 1 TABLET EVERY DAY 90 tablet 3   LORazepam (ATIVAN) 1 MG tablet Take 1 tablet 30 minutes prior to leaving house on day of office surgery.  Bring second tablet with you to office on day of office surgery. 2 tablet 0   lovastatin (MEVACOR) 20 MG tablet TAKE 1 TABLET EVERY DAY (Patient taking differently: Take 20 mg by mouth daily.) 90 tablet 3   psyllium (HYDROCIL/METAMUCIL) 95 %  PACK Take 1 packet by mouth daily as needed (constipation).      valsartan (DIOVAN) 80 MG tablet TAKE 1 TABLET EVERY DAY 90 tablet 3   No current facility-administered medications on file prior to visit.        ROS:  All others reviewed and negative.  Objective        PE:  BP 120/68 (BP Location: Right Arm, Patient Position: Sitting, Cuff Size: Large)   Pulse 91   Temp 98.7 F (37.1 C) (Oral)   Ht 4\' 11"  (1.499 m)   Wt 160 lb (72.6 kg)   SpO2 96%   BMI 32.32 kg/m                 Constitutional: Pt appears in NAD               HENT: Head: NCAT.                Right Ear: External ear normal.                 Left Ear: External ear normal.                Eyes: . Pupils are equal, round, and reactive to light. Conjunctivae and EOM are normal               Nose: without d/c or deformity               Neck: Neck supple. Gross normal ROM               Cardiovascular: Normal rate and regular rhythm.                 Pulmonary/Chest: Effort normal and breath sounds without rales or wheezing.                Abd:  Soft, NT, ND, + BS, no organomegaly               Neurological: Pt is alert. At baseline orientation, motor grossly intact               Skin: Skin is warm. No rashes, no other new lesions, LE edema - right leg below the knee with 2+ edema and tightness to whole, has mild gastroc tender but homan neg.                 Psychiatric: Pt behavior is normal without agitation   Micro: none  Cardiac tracings I have personally interpreted today:  none  Pertinent Radiological findings (summarize): none   Lab Results  Component Value Date   WBC 4.0 10/27/2021   HGB 11.9 (L) 10/27/2021  HCT 35.6 (L) 10/27/2021   PLT 250.0 10/27/2021   GLUCOSE 71 10/27/2021   CHOL 160 10/27/2021   TRIG 60.0 10/27/2021   HDL 55.30 10/27/2021   LDLDIRECT 118.5 01/01/2012   LDLCALC 93 10/27/2021   ALT 8 10/27/2021   AST 17 10/27/2021   NA 140 10/27/2021   K 4.8 10/27/2021   CL 104 10/27/2021    CREATININE 0.85 10/27/2021   BUN 13 10/27/2021   CO2 31 10/27/2021   TSH 1.18 10/27/2021   INR 1.1 08/16/2020   HGBA1C 6.2 10/27/2021   Assessment/Plan:  [name] is a 76 y.o. Black or African American [2] female with  has a past medical history of Allergic rhinitis, cause unspecified (12/26/2010), Cataract, DEGENERATIVE JOINT DISEASE (11/25/2006), DEPRESSION (11/25/2006), DIVERTICULOSIS, COLON (12/04/2006), DIZZINESS (11/18/2009), DJD (degenerative joint disease), HYPERCHOLESTEROLEMIA (11/25/2006), HYPERLIPIDEMIA (11/18/2009), HYPERPARATHYROIDISM, HX OF (11/25/2006), HYPERTENSION (07/15/2007), HYPOTHYROIDISM (11/25/2006), OBESITY (12/04/2006), OSTEOPENIA (11/25/2006), OTITIS MEDIA, ACUTE, LEFT (11/18/2009), PALPITATIONS, RECURRENT (07/20/2008), POSTMENOPAUSAL STATUS (11/25/2006), PULMONARY NODULE (11/25/2006), and Vitamin D deficiency.  Pain and swelling of lower leg, right Worsening quite tight now for over 2 wks - for stat venous doppler u/s  Essential hypertension BP Readings from Last 3 Encounters:  10/27/21 120/68  09/22/21 115/71  09/04/21 113/75   Stable, pt to continue medical treatment diovan 80 mg qd   Hyperglycemia Lab Results  Component Value Date   HGBA1C 6.2 10/27/2021   Stable, pt to continue current medical treatment  - diet, wt control, excercise   Hyperlipidemia Lab Results  Component Value Date   LDLCALC 93 10/27/2021   Stable, pt to continue current statin lovastatin 20 mg\   Hypothyroidism Lab Results  Component Value Date   TSH 1.18 10/27/2021   Stable, pt to continue levothyroxine 75 mcg qd  Followup: No follow-ups on file.  Cathlean Cower, MD 10/29/2021 8:41 PM [facility]

## 2021-10-27 NOTE — Patient Instructions (Signed)
Please continue all other medications as before, and refills have been done if requested.  Please have the pharmacy call with any other refills you may need.  Please continue your efforts at being more active, low cholesterol diet, and weight control.  Please keep your appointments with your specialists as you may have planned  You will be contacted regarding the referral for: leg vein circulation testing  Please go to the LAB at the blood drawing area for the tests to be done  You will be contacted by phone if any changes need to be made immediately.  Otherwise, you will receive a letter about your results with an explanation, but please check with MyChart first.  Please remember to sign up for MyChart if you have not done so, as this will be important to you in the future with finding out test results, communicating by private email, and scheduling acute appointments online when needed.  Please make an Appointment to return in 6 months, or sooner if needed

## 2021-10-27 NOTE — Assessment & Plan Note (Signed)
Worsening quite tight now for over 2 wks - for stat venous doppler u/s

## 2021-10-28 ENCOUNTER — Telehealth: Payer: Self-pay | Admitting: Internal Medicine

## 2021-10-28 NOTE — Telephone Encounter (Signed)
Please call daughter to discuss MRI scheduling - Dr. Jonny Ruiz had sent the message for one of them to call him.

## 2021-10-29 ENCOUNTER — Encounter: Payer: Self-pay | Admitting: Internal Medicine

## 2021-10-29 NOTE — Telephone Encounter (Signed)
Returned call to patient's daughter who is not listed on her mothers DPR. I recommended that her and her sister bring mom into the office to complete the form. I did not disclose any patient information but she would like to know that since her mom has an ultrasound scheduled for tomorrow, does she still need an MRI. Her AVS did not specify.

## 2021-10-29 NOTE — Assessment & Plan Note (Signed)
Lab Results  Component Value Date   TSH 1.18 10/27/2021   Stable, pt to continue levothyroxine 75 mcg qd

## 2021-10-29 NOTE — Assessment & Plan Note (Signed)
Lab Results  Component Value Date   HGBA1C 6.2 10/27/2021   Stable, pt to continue current medical treatment  - diet, wt control, excercise

## 2021-10-29 NOTE — Assessment & Plan Note (Signed)
Lab Results  Component Value Date   LDLCALC 93 10/27/2021   Stable, pt to continue current statin lovastatin 20 mg\

## 2021-10-29 NOTE — Telephone Encounter (Signed)
Yes, I would have all testing as ordered

## 2021-10-29 NOTE — Assessment & Plan Note (Signed)
BP Readings from Last 3 Encounters:  10/27/21 120/68  09/22/21 115/71  09/04/21 113/75   Stable, pt to continue medical treatment diovan 80 mg qd

## 2021-10-30 ENCOUNTER — Ambulatory Visit (HOSPITAL_COMMUNITY): Admission: RE | Admit: 2021-10-30 | Payer: Medicare HMO | Source: Ambulatory Visit

## 2021-11-03 NOTE — Telephone Encounter (Signed)
Patient's daughter notified and updated.

## 2021-11-06 ENCOUNTER — Ambulatory Visit (HOSPITAL_COMMUNITY)
Admission: RE | Admit: 2021-11-06 | Discharge: 2021-11-06 | Disposition: A | Discharge status: Home / Self Care | Payer: Medicare HMO | Source: Ambulatory Visit | Attending: Cardiology | Admitting: Cardiology

## 2021-11-06 DIAGNOSIS — M79661 Pain in right lower leg: Secondary | ICD-10-CM | POA: Insufficient documentation

## 2021-11-06 DIAGNOSIS — M7989 Other specified soft tissue disorders: Secondary | ICD-10-CM | POA: Diagnosis not present

## 2021-11-24 ENCOUNTER — Ambulatory Visit (HOSPITAL_COMMUNITY): Payer: Medicare HMO

## 2021-11-24 ENCOUNTER — Ambulatory Visit: Payer: Medicare HMO | Admitting: Surgery

## 2021-11-25 DIAGNOSIS — H524 Presbyopia: Secondary | ICD-10-CM | POA: Diagnosis not present

## 2021-11-25 DIAGNOSIS — H2513 Age-related nuclear cataract, bilateral: Secondary | ICD-10-CM | POA: Diagnosis not present

## 2021-11-25 DIAGNOSIS — H43813 Vitreous degeneration, bilateral: Secondary | ICD-10-CM | POA: Diagnosis not present

## 2021-12-04 ENCOUNTER — Ambulatory Visit (HOSPITAL_COMMUNITY)
Admission: RE | Admit: 2021-12-04 | Discharge: 2021-12-04 | Disposition: A | Discharge status: Home / Self Care | Payer: Medicare HMO | Source: Ambulatory Visit | Attending: Surgery | Admitting: Surgery

## 2021-12-04 DIAGNOSIS — M7989 Other specified soft tissue disorders: Secondary | ICD-10-CM

## 2021-12-04 DIAGNOSIS — I872 Venous insufficiency (chronic) (peripheral): Secondary | ICD-10-CM | POA: Diagnosis not present

## 2021-12-04 DIAGNOSIS — I83893 Varicose veins of bilateral lower extremities with other complications: Secondary | ICD-10-CM | POA: Diagnosis not present

## 2021-12-04 DIAGNOSIS — M79662 Pain in left lower leg: Secondary | ICD-10-CM | POA: Diagnosis not present

## 2021-12-14 ENCOUNTER — Other Ambulatory Visit: Payer: Self-pay | Admitting: Internal Medicine

## 2021-12-14 NOTE — Telephone Encounter (Signed)
Please refill as per office routine med refill policy (all routine meds to be refilled for 3 mo or monthly (per pt preference) up to one year from last visit, then month to month grace period for 3 mo, then further med refills will have to be denied) ? ?

## 2021-12-15 ENCOUNTER — Ambulatory Visit: Payer: Medicare HMO | Admitting: Surgery

## 2022-01-12 ENCOUNTER — Other Ambulatory Visit: Payer: Self-pay | Admitting: Internal Medicine

## 2022-02-25 ENCOUNTER — Other Ambulatory Visit: Payer: Self-pay | Admitting: Obstetrics and Gynecology

## 2022-02-25 DIAGNOSIS — Z1231 Encounter for screening mammogram for malignant neoplasm of breast: Secondary | ICD-10-CM

## 2022-03-16 ENCOUNTER — Ambulatory Visit: Payer: Medicare HMO

## 2022-03-31 ENCOUNTER — Ambulatory Visit: Payer: Medicare HMO

## 2022-04-03 ENCOUNTER — Ambulatory Visit (INDEPENDENT_AMBULATORY_CARE_PROVIDER_SITE_OTHER): Payer: Medicare HMO

## 2022-04-03 VITALS — Wt 160.0 lb

## 2022-04-03 DIAGNOSIS — Z0000 Encounter for general adult medical examination without abnormal findings: Secondary | ICD-10-CM

## 2022-04-03 NOTE — Progress Notes (Signed)
I connected with  Zoi Salo Espy on 04/03/22 by a audio enabled telemedicine application and verified that I am speaking with the correct person using two identifiers.  Patient Location: Home  Provider Location: Office/Clinic  I discussed the limitations of evaluation and management by telemedicine. The patient expressed understanding and agreed to proceed.  Subjective:   Syble Wiedmann Branham is a 77 y.o. female who presents for Medicare Annual (Subsequent) preventive examination.  Review of Systems     Cardiac Risk Factors include: advanced age (>67mn, >>63women);dyslipidemia;family history of premature cardiovascular disease;hypertension     Objective:    Today's Vitals   04/03/22 1400  PainSc: 7    There is no height or weight on file to calculate BMI.     04/03/2022    2:06 PM 03/14/2021   10:44 AM 08/26/2020    1:15 PM 08/16/2020   11:18 AM 02/22/2020   11:24 AM 08/30/2018   11:37 AM 08/24/2017   11:28 AM  Advanced Directives  Does Patient Have a Medical Advance Directive? No No No No No No No  Would patient like information on creating a medical advance directive? No - Patient declined Yes (ED - Information included in AVS) No - Patient declined  No - Patient declined Yes (ED - Information included in AVS) No - Patient declined    Current Medications (verified) Outpatient Encounter Medications as of 04/03/2022  Medication Sig   alendronate (FOSAMAX) 70 MG tablet TAKE 1 TABLET ONCE EVERY WEEK (ON SUNDAY MORNINGS) WITH A FULL GLASS OF WATER ON AN EMPTY STOMACH   aspirin 325 MG EC tablet Take 1 tablet (325 mg total) by mouth daily. (Patient taking differently: Take 325 mg by mouth daily as needed for pain (headache).)   cholecalciferol (VITAMIN D) 1000 UNITS tablet Take 1,000 Units by mouth daily.   levothyroxine (SYNTHROID) 75 MCG tablet TAKE 1 TABLET EVERY DAY   lovastatin (MEVACOR) 20 MG tablet TAKE 1 TABLET EVERY DAY   psyllium (HYDROCIL/METAMUCIL) 95 % PACK Take 1 packet by  mouth daily as needed (constipation).    valsartan (DIOVAN) 80 MG tablet TAKE 1 TABLET EVERY DAY   furosemide (LASIX) 20 MG tablet TAKE 1 TABLET(20 MG) BY MOUTH DAILY AS NEEDED (Patient not taking: Reported on 04/03/2022)   LORazepam (ATIVAN) 1 MG tablet Take 1 tablet 30 minutes prior to leaving house on day of office surgery.  Bring second tablet with you to office on day of office surgery. (Patient not taking: Reported on 04/03/2022)   No facility-administered encounter medications on file as of 04/03/2022.    Allergies (verified) Patient has no known allergies.   History: Past Medical History:  Diagnosis Date   Allergic rhinitis, cause unspecified 12/26/2010   Cataract    1 eye-? which   DEGENERATIVE JOINT DISEASE 11/25/2006   in knees    DEPRESSION 11/25/2006   DIVERTICULOSIS, COLON 12/04/2006   DIZZINESS 11/18/2009   DJD (degenerative joint disease)    Bilateral Hip   HYPERCHOLESTEROLEMIA 11/25/2006   HYPERLIPIDEMIA 11/18/2009   HYPERPARATHYROIDISM, HX OF 11/25/2006   HYPERTENSION 07/15/2007   HYPOTHYROIDISM 11/25/2006   OBESITY 12/04/2006   OSTEOPENIA 11/25/2006   OTITIS MEDIA, ACUTE, LEFT 11/18/2009   PALPITATIONS, RECURRENT 07/20/2008   POSTMENOPAUSAL STATUS 11/25/2006   PULMONARY NODULE 11/25/2006   Vitamin D deficiency    Past Surgical History:  Procedure Laterality Date   ABDOMINAL HYSTERECTOMY     COLONOSCOPY     ENDOVENOUS ABLATION SAPHENOUS VEIN W/ LASER Right 09/04/2021  endovenous laser ablation right small saphenous vein and stab phlebectomy 10-20 incisions right leg by Harold Barban MD   left knee     PARATHYROIDECTOMY Right 11/20/2014   Procedure: RIGHT PARATHYROIDECTOMY;  Surgeon: Armandina Gemma, MD;  Location: [facility];  Service: General;  Laterality: Right;   POLYPECTOMY     right wrist     THYROID SURGERY     early 70's   TOTAL HIP ARTHROPLASTY     left 3/05, right 2002   TOTAL HIP ARTHROPLASTY Left 2005   TOTAL KNEE ARTHROPLASTY Left 08/26/2020    Procedure: TOTAL KNEE ARTHROPLASTY;  Surgeon: Gaynelle Arabian, MD;  Location: [facility];  Service: Orthopedics;  Laterality: Left;  [id number]   UMBILICAL HERNIA REPAIR     Family History  Problem Relation Age of Onset   Breast cancer Mother    Stroke Father    Hypertension Other    Colon cancer Neg Hx    Esophageal cancer Neg Hx    Rectal cancer Neg Hx    Stomach cancer Neg Hx    Social History   Socioeconomic History   Marital status: Widowed    Spouse name: Not on file   Number of children: 2   Years of education: Not on file   Highest education level: Not on file  Occupational History   Occupation: retired  Tobacco Use   Smoking status: Former    Years: 1.00    Types: Cigarettes    Quit date: 02/17/1968    Years since quitting: 54.1    Passive exposure: Never   Smokeless tobacco: Former    Quit date: 11/12/1974   Tobacco comments:    tried snuff as a child, casual smoker as young adult  Vaping Use   Vaping Use: Never used  Substance and Sexual Activity   Alcohol use: No    Alcohol/week: 0.0 standard drinks of alcohol   Drug use: No   Sexual activity: Never  Other Topics Concern   Not on file  Social History Narrative   Not on file   Social Determinants of Health   Financial Resource Strain: Low Risk  (04/03/2022)   Overall Financial Resource Strain (CARDIA)    Difficulty of Paying Living Expenses: Not hard at all  Food Insecurity: No Food Insecurity (04/03/2022)   Hunger Vital Sign    Worried About Running Out of Food in the Last Year: Never true    [place] in the Last Year: Never true  Transportation Needs: No Transportation Needs (04/03/2022)   PRAPARE - Hydrologist (Medical): No    Lack of Transportation (Non-Medical): No  Physical Activity: Insufficiently Active (04/03/2022)   Exercise Vital Sign    Days of Exercise per Week: 2 days    Minutes of Exercise per Session: 20 min  Stress: No Stress Concern Present (04/03/2022)    [place]    Feeling of Stress : Not at all  Social Connections: Moderately Isolated (04/03/2022)   Social Connection and Isolation Panel [NHANES]    Frequency of Communication with Friends and Family: More than three times a week    Frequency of Social Gatherings with Friends and Family: More than three times a week    Attends Religious Services: More than 4 times per year    Active Member of [employer] or Organizations: No    Attends Archivist Meetings: Never    Marital Status: Widowed  Tobacco Counseling Counseling given: Not Answered Tobacco comments: tried snuff as a child, casual smoker as young adult   Clinical Intake:  Pre-visit preparation completed: Yes  Pain : 0-10 Pain Score: 7  Pain Type: Chronic pain Pain Location: Back Pain Orientation: Lower     Nutritional Risks: None Diabetes: No  How often do you need to have someone help you when you read instructions, pamphlets, or other written materials from your doctor or pharmacy?: 1 - Never  Diabetic?no  Interpreter Needed?: No  Information entered by :: Kirke Shaggy, LPN   Activities of Daily Living    04/03/2022    2:07 PM  In your present state of health, do you have any difficulty performing the following activities:  Hearing? 0  Vision? 0  Difficulty concentrating or making decisions? 0  Walking or climbing stairs? 1  Dressing or bathing? 0  Doing errands, shopping? 0  Preparing Food and eating ? N  Using the Toilet? N  In the past six months, have you accidently leaked urine? N  Do you have problems with loss of bowel control? N  Managing your Medications? N  Managing your Finances? N  Housekeeping or managing your Housekeeping? Y    Patient Care Team: Biagio Borg, MD as PCP - General Tanda Rockers, MD as Consulting Physician (Pulmonary Disease) Gaynelle Arabian, MD as Consulting Physician (Orthopedic  Surgery) Laurence Aly, [place] as Consulting Physician (Optometry)  Indicate any recent Medical Services you may have received from other than [facility] providers in the past year (date may be approximate).     Assessment:   This is a routine wellness examination for [facility].  Hearing/Vision screen Hearing Screening - Comments:: No aids Vision Screening - Comments:: Wears glasses- MD in [place]  Dietary issues and exercise activities discussed: Current Exercise Habits: Home exercise routine, Type of exercise: walking, Time (Minutes): 20, Frequency (Times/Week): 2, Weekly Exercise (Minutes/Week): 40, Intensity: Mild   Goals Addressed             This Visit's Progress    DIET - EAT MORE FRUITS AND VEGETABLES         Depression Screen    04/03/2022    2:04 PM 10/27/2021    9:59 AM 04/16/2021    2:22 PM 03/14/2021   10:50 AM 04/02/2020   12:35 PM 02/22/2020   11:29 AM 07/25/2019   11:30 AM  PHQ 2/9 Scores  PHQ - 2 Score 1 0 0 0 0 0 0  PHQ- 9 Score 1 0         Fall Risk    04/03/2022    2:07 PM 10/27/2021    9:59 AM 04/16/2021    2:21 PM 03/14/2021   10:46 AM 04/02/2020   12:35 PM  Fall Risk   Falls in the past year? 0 0 0 0 0  Number falls in past yr: 0  0 0   Injury with Fall? 0 0 0 0   Risk for fall due to : No Fall Risks No Fall Risks  Impaired balance/gait   Follow up Falls prevention discussed;Falls evaluation completed Falls evaluation completed  Falls evaluation completed     FALL RISK PREVENTION PERTAINING TO THE HOME:  Any stairs in or around the home? Yes  If so, are there any without handrails? No  Home free of loose throw rugs in walkways, pet beds, electrical cords, etc? Yes  Adequate lighting in your home to reduce risk of falls? Yes  ASSISTIVE DEVICES UTILIZED TO PREVENT FALLS:  Life alert? No  Use of a cane, walker or w/c? Yes  Grab bars in the bathroom? Yes  Shower chair or bench in shower? Yes  Elevated toilet seat or a handicapped toilet? Yes    Cognitive  Function:    08/24/2017   11:37 AM 08/14/2016   11:44 AM 01/14/2015    1:59 PM  MMSE - Mini Mental State Exam  Orientation to time 5 5 5  $ Orientation to Place 5 5 5  $ Registration 3 3 3  $ Attention/ Calculation 5 4 2  $ Recall 2 2 2  $ Language- name 2 objects 2 2 2  $ Language- repeat 1 1 1  $ Language- follow 3 step command 3 3 3  $ Language- read & follow direction 1 1 1  $ Write a sentence 1 1 1  $ Copy design 1 1 1  $ Total score 29 28 26        $ 04/03/2022    2:12 PM  6CIT Screen  What Year? 0 points  What month? 0 points  What time? 0 points  Count back from 20 0 points  Months in reverse 0 points  Repeat phrase 10 points  Total Score 10 points    Immunizations Immunization History  Administered Date(s) Administered   Moderna Sars-Covid-2 Vaccination 03/22/2019, 03/31/2019, 04/12/2019, 04/28/2019, 01/23/2020   Td 07/21/1995, 11/18/2009    TDAP status: Due, Education has been provided regarding the importance of this vaccine. Advised may receive this vaccine at local pharmacy or Health Dept. Aware to provide a copy of the vaccination record if obtained from local pharmacy or Health Dept. Verbalized acceptance and understanding.  Flu Vaccine status: Declined, Education has been provided regarding the importance of this vaccine but patient still declined. Advised may receive this vaccine at local pharmacy or Health Dept. Aware to provide a copy of the vaccination record if obtained from local pharmacy or Health Dept. Verbalized acceptance and understanding.  Pneumococcal vaccine status: Declined,  Education has been provided regarding the importance of this vaccine but patient still declined. Advised may receive this vaccine at local pharmacy or Health Dept. Aware to provide a copy of the vaccination record if obtained from local pharmacy or Health Dept. Verbalized acceptance and understanding.   Covid-19 vaccine status: Completed vaccines  Qualifies for Shingles Vaccine? Yes    Zostavax completed No   Shingrix Completed?: No.    Education has been provided regarding the importance of this vaccine. Patient has been advised to call insurance company to determine out of pocket expense if they have not yet received this vaccine. Advised may also receive vaccine at local pharmacy or Health Dept. Verbalized acceptance and understanding.  Screening Tests Health Maintenance  Topic Date Due   Zoster Vaccines- Shingrix (1 of 2) Never done   DTaP/Tdap/Td (3 - Tdap) 11/19/2019   COVID-19 Vaccine (6 - 2023-24 season) 10/17/2021   Pneumonia Vaccine 40+ Years old (1 of 1 - PCV) 04/17/2022 (Originally 08/20/2008)   Medicare Annual Wellness (AWV)  04/04/2023   DEXA SCAN  Completed   Hepatitis C Screening  Completed   HPV VACCINES  Aged Out   INFLUENZA VACCINE  Discontinued   COLONOSCOPY (Pts 45-40yr Insurance coverage will need to be confirmed)  Discontinued    Health Maintenance  Health Maintenance Due  Topic Date Due   Zoster Vaccines- Shingrix (1 of 2) Never done   DTaP/Tdap/Td (3 - Tdap) 11/19/2019   COVID-19 Vaccine (6 - 2023-24 season) 10/17/2021  Colorectal cancer screening: No longer required.   Mammogram status: No longer required due to age.  Bone Density status: Completed 07/21/16. Results reflect: Bone density results: OSTEOPOROSIS. Repeat every 2 years.-referral sent  Lung Cancer Screening: (Low Dose CT Chest recommended if Age 54-80 years, 30 pack-year currently smoking OR have quit w/in 15years.) does not qualify.    Additional Screening:  Hepatitis C Screening: does not qualify; Completed 07/16/15  Vision Screening: Recommended annual ophthalmology exams for early detection of glaucoma and other disorders of the eye. Is the patient up to date with their annual eye exam?  Yes  Who is the provider or what is the name of the office in which the patient attends annual eye exams? MD in [place] If pt is not established with a provider, would they like to be  referred to a provider to establish care? No .   Dental Screening: Recommended annual dental exams for proper oral hygiene  Community Resource Referral / Chronic Care Management: CRR required this visit?  No   CCM required this visit?  No      Plan:     I have personally reviewed and noted the following in the patient's chart:   Medical and social history Use of alcohol, tobacco or illicit drugs  Current medications and supplements including opioid prescriptions. Patient is not currently taking opioid prescriptions. Functional ability and status Nutritional status Physical activity Advanced directives List of other physicians Hospitalizations, surgeries, and ER visits in previous 12 months Vitals Screenings to include cognitive, depression, and falls Referrals and appointments  In addition, I have reviewed and discussed with patient certain preventive protocols, quality metrics, and best practice recommendations. A written personalized care plan for preventive services as well as general preventive health recommendations were provided to patient.     Dionisio David, LPN   [id number]   Nurse Notes: none

## 2022-04-03 NOTE — Patient Instructions (Signed)
[name] , Thank you for taking time to come for your Medicare Wellness Visit. I appreciate your ongoing commitment to your health goals. Please review the following plan we discussed and let me know if I can assist you in the future.   These are the goals we discussed:  Goals       DIET - EAT MORE FRUITS AND VEGETABLES      Exercise 150 minutes per week (moderate activity)      Will continue to go to the church 2 days a week and exercise when it reopens. I want to join [employer] again to lose weight and become as healthy as possible.      Patient Stated (pt-stated)      My goal is to get mobile so that my legs/hips will get more stronger.        This is a list of the screening recommended for you and due dates:  Health Maintenance  Topic Date Due   Zoster (Shingles) Vaccine (1 of 2) Never done   DTaP/Tdap/Td vaccine (3 - Tdap) 11/19/2019   COVID-19 Vaccine (6 - 2023-24 season) 10/17/2021   Pneumonia Vaccine (1 of 1 - PCV) 04/17/2022*   Medicare Annual Wellness Visit  04/04/2023   DEXA scan (bone density measurement)  Completed   Hepatitis C Screening: USPSTF Recommendation to screen - Ages 75-79 yo.  Completed   HPV Vaccine  Aged Out   Flu Shot  Discontinued   Colon Cancer Screening  Discontinued  *Topic was postponed. The date shown is not the original due date.    Advanced directives: on  Conditions/risks identified: none  Next appointment: Follow up in one year for your annual wellness visit 04/05/23 @ 1:30pm by phone   Preventive Care 65 Years and Older, Female Preventive care refers to lifestyle choices and visits with your health care provider that can promote health and wellness. What does preventive care include? A yearly physical exam. This is also called an annual well check. Dental exams once or twice a year. Routine eye exams. Ask your health care provider how often you should have your eyes checked. Personal lifestyle choices, including: Daily care  of your teeth and gums. Regular physical activity. Eating a healthy diet. Avoiding tobacco and drug use. Limiting alcohol use. Practicing safe sex. Taking low-dose aspirin every day. Taking vitamin and mineral supplements as recommended by your health care provider. What happens during an annual well check? The services and screenings done by your health care provider during your annual well check will depend on your age, overall health, lifestyle risk factors, and family history of disease. Counseling  Your health care provider may ask you questions about your: Alcohol use. Tobacco use. Drug use. Emotional well-being. Home and relationship well-being. Sexual activity. Eating habits. History of falls. Memory and ability to understand (cognition). Work and work Statistician. Reproductive health. Screening  You may have the following tests or measurements: Height, weight, and BMI. Blood pressure. Lipid and cholesterol levels. These may be checked every 5 years, or more frequently if you are over 58 years old. Skin check. Lung cancer screening. You may have this screening every year starting at age 58 if you have a 30-pack-year history of smoking and currently smoke or have quit within the past 15 years. Fecal occult blood test (FOBT) of the stool. You may have this test every year starting at age 5. Flexible sigmoidoscopy or colonoscopy. You may have a sigmoidoscopy every 5 years or a colonoscopy  every 10 years starting at age 34. Hepatitis C blood test. Hepatitis B blood test. Sexually transmitted disease (STD) testing. Diabetes screening. This is done by checking your blood sugar (glucose) after you have not eaten for a while (fasting). You may have this done every 1-3 years. Bone density scan. This is done to screen for osteoporosis. You may have this done starting at age 101. Mammogram. This may be done every 1-2 years. Talk to your health care provider about how often you  should have regular mammograms. Talk with your health care provider about your test results, treatment options, and if necessary, the need for more tests. Vaccines  Your health care provider may recommend certain vaccines, such as: Influenza vaccine. This is recommended every year. Tetanus, diphtheria, and acellular pertussis (Tdap, Td) vaccine. You may need a Td booster every 10 years. Zoster vaccine. You may need this after age 24. Pneumococcal 13-valent conjugate (PCV13) vaccine. One dose is recommended after age 37. Pneumococcal polysaccharide (PPSV23) vaccine. One dose is recommended after age 45. Talk to your health care provider about which screenings and vaccines you need and how often you need them. This information is not intended to replace advice given to you by your health care provider. Make sure you discuss any questions you have with your health care provider. Document Released: 03/01/2015 Document Revised: 10/23/2015 Document Reviewed: 12/04/2014 Elsevier Interactive Patient Education  2017 [place] Prevention in the Home Falls can cause injuries. They can happen to people of all ages. There are many things you can do to make your home safe and to help prevent falls. What can I do on the outside of my home? Regularly fix the edges of walkways and driveways and fix any cracks. Remove anything that might make you trip as you walk through a door, such as a raised step or threshold. Trim any bushes or trees on the path to your home. Use bright outdoor lighting. Clear any walking paths of anything that might make someone trip, such as rocks or tools. Regularly check to see if handrails are loose or broken. Make sure that both sides of any steps have handrails. Any raised decks and porches should have guardrails on the edges. Have any leaves, snow, or ice cleared regularly. Use sand or salt on walking paths during winter. Clean up any spills in your garage right away.  This includes oil or grease spills. What can I do in the bathroom? Use night lights. Install grab bars by the toilet and in the tub and shower. Do not use towel bars as grab bars. Use non-skid mats or decals in the tub or shower. If you need to sit down in the shower, use a plastic, non-slip stool. Keep the floor dry. Clean up any water that spills on the floor as soon as it happens. Remove soap buildup in the tub or shower regularly. Attach bath mats securely with double-sided non-slip rug tape. Do not have throw rugs and other things on the floor that can make you trip. What can I do in the bedroom? Use night lights. Make sure that you have a light by your bed that is easy to reach. Do not use any sheets or blankets that are too big for your bed. They should not hang down onto the floor. Have a firm chair that has side arms. You can use this for support while you get dressed. Do not have throw rugs and other things on the floor that can make  you trip. What can I do in the kitchen? Clean up any spills right away. Avoid walking on wet floors. Keep items that you use a lot in easy-to-reach places. If you need to reach something above you, use a strong step stool that has a grab bar. Keep electrical cords out of the way. Do not use floor polish or wax that makes floors slippery. If you must use wax, use non-skid floor wax. Do not have throw rugs and other things on the floor that can make you trip. What can I do with my stairs? Do not leave any items on the stairs. Make sure that there are handrails on both sides of the stairs and use them. Fix handrails that are broken or loose. Make sure that handrails are as long as the stairways. Check any carpeting to make sure that it is firmly attached to the stairs. Fix any carpet that is loose or worn. Avoid having throw rugs at the top or bottom of the stairs. If you do have throw rugs, attach them to the floor with carpet tape. Make sure that  you have a light switch at the top of the stairs and the bottom of the stairs. If you do not have them, ask someone to add them for you. What else can I do to help prevent falls? Wear shoes that: Do not have high heels. Have rubber bottoms. Are comfortable and fit you well. Are closed at the toe. Do not wear sandals. If you use a stepladder: Make sure that it is fully opened. Do not climb a closed stepladder. Make sure that both sides of the stepladder are locked into place. Ask someone to hold it for you, if possible. Clearly mark and make sure that you can see: Any grab bars or handrails. First and last steps. Where the edge of each step is. Use tools that help you move around (mobility aids) if they are needed. These include: Canes. Walkers. Scooters. Crutches. Turn on the lights when you go into a dark area. Replace any light bulbs as soon as they burn out. Set up your furniture so you have a clear path. Avoid moving your furniture around. If any of your floors are uneven, fix them. If there are any pets around you, be aware of where they are. Review your medicines with your doctor. Some medicines can make you feel dizzy. This can increase your chance of falling. Ask your doctor what other things that you can do to help prevent falls. This information is not intended to replace advice given to you by your health care provider. Make sure you discuss any questions you have with your health care provider. Document Released: 11/29/2008 Document Revised: 07/11/2015 Document Reviewed: 03/09/2014 Elsevier Interactive Patient Education  2017 [employer].

## 2022-04-17 ENCOUNTER — Ambulatory Visit: Payer: Medicare HMO

## 2022-04-23 ENCOUNTER — Other Ambulatory Visit: Payer: Self-pay | Admitting: Internal Medicine

## 2022-04-28 ENCOUNTER — Ambulatory Visit (INDEPENDENT_AMBULATORY_CARE_PROVIDER_SITE_OTHER): Payer: Medicare HMO | Admitting: Internal Medicine

## 2022-04-28 ENCOUNTER — Encounter: Payer: Self-pay | Admitting: Internal Medicine

## 2022-04-28 ENCOUNTER — Other Ambulatory Visit: Payer: Self-pay | Admitting: Internal Medicine

## 2022-04-28 VITALS — BP 126/74 | HR 83 | Temp 98.2°F | Ht 59.0 in | Wt 174.0 lb

## 2022-04-28 DIAGNOSIS — I10 Essential (primary) hypertension: Secondary | ICD-10-CM | POA: Diagnosis not present

## 2022-04-28 DIAGNOSIS — E7800 Pure hypercholesterolemia, unspecified: Secondary | ICD-10-CM | POA: Diagnosis not present

## 2022-04-28 DIAGNOSIS — Z0001 Encounter for general adult medical examination with abnormal findings: Secondary | ICD-10-CM

## 2022-04-28 DIAGNOSIS — E559 Vitamin D deficiency, unspecified: Secondary | ICD-10-CM | POA: Diagnosis not present

## 2022-04-28 DIAGNOSIS — R739 Hyperglycemia, unspecified: Secondary | ICD-10-CM | POA: Diagnosis not present

## 2022-04-28 DIAGNOSIS — E538 Deficiency of other specified B group vitamins: Secondary | ICD-10-CM

## 2022-04-28 LAB — URINALYSIS, ROUTINE W REFLEX MICROSCOPIC
Bilirubin Urine: NEGATIVE
Hgb urine dipstick: NEGATIVE
Ketones, ur: NEGATIVE
Nitrite: NEGATIVE
Specific Gravity, Urine: 1.02 (ref 1.000–1.030)
Total Protein, Urine: NEGATIVE
Urine Glucose: NEGATIVE
Urobilinogen, UA: 1 (ref 0.0–1.0)
pH: 6.5 (ref 5.0–8.0)

## 2022-04-28 LAB — LIPID PANEL
Cholesterol: 175 mg/dL (ref 0–200)
HDL: 64 mg/dL (ref >39.00)
LDL Cholesterol: 98 mg/dL (ref 0–99)
NonHDL: 110.97
Total CHOL/HDL Ratio: 3
Triglycerides: 63 mg/dL (ref 0.0–149.0)
VLDL: 12.6 mg/dL (ref 0.0–40.0)

## 2022-04-28 LAB — HEPATIC FUNCTION PANEL
ALT: 12 U/L (ref 0–35)
AST: 21 U/L (ref 0–37)
Albumin: 3.6 g/dL (ref 3.5–5.2)
Alkaline Phosphatase: 46 U/L (ref 39–117)
Bilirubin, Direct: 0.1 mg/dL (ref 0.0–0.3)
Total Bilirubin: 0.4 mg/dL (ref 0.2–1.2)
Total Protein: 7.2 g/dL (ref 6.0–8.3)

## 2022-04-28 LAB — CBC WITH DIFFERENTIAL/PLATELET
Basophils Absolute: 0.1 10*3/uL (ref 0.0–0.1)
Basophils Relative: 2.9 % (ref 0.0–3.0)
Eosinophils Absolute: 0.1 10*3/uL (ref 0.0–0.7)
Eosinophils Relative: 4.3 % (ref 0.0–5.0)
HCT: 37.1 % (ref 36.0–46.0)
Hemoglobin: 12.2 g/dL (ref 12.0–15.0)
Lymphocytes Relative: 15.9 % (ref 12.0–46.0)
Lymphs Abs: 0.5 10*3/uL — ABNORMAL LOW (ref 0.7–4.0)
MCHC: 32.9 g/dL (ref 30.0–36.0)
MCV: 88.3 fl (ref 78.0–100.0)
Monocytes Absolute: 0.4 10*3/uL (ref 0.1–1.0)
Monocytes Relative: 11.9 % (ref 3.0–12.0)
Neutro Abs: 1.9 10*3/uL (ref 1.4–7.7)
Neutrophils Relative %: 65 % (ref 43.0–77.0)
Platelets: 233 10*3/uL (ref 150.0–400.0)
RBC: 4.2 Mil/uL (ref 3.87–5.11)
RDW: 14.9 % (ref 11.5–15.5)
WBC: 3 10*3/uL — ABNORMAL LOW (ref 4.0–10.5)

## 2022-04-28 LAB — VITAMIN D 25 HYDROXY (VIT D DEFICIENCY, FRACTURES): VITD: 37.61 ng/mL (ref 30.00–100.00)

## 2022-04-28 LAB — BASIC METABOLIC PANEL
BUN: 14 mg/dL (ref 6–23)
CO2: 29 mEq/L (ref 19–32)
Calcium: 9.3 mg/dL (ref 8.4–10.5)
Chloride: 104 mEq/L (ref 96–112)
Creatinine, Ser: 0.71 mg/dL (ref 0.40–1.20)
GFR: 81.29 mL/min (ref >60.00)
Glucose, Bld: 90 mg/dL (ref 70–99)
Potassium: 4.6 mEq/L (ref 3.5–5.1)
Sodium: 140 mEq/L (ref 135–145)

## 2022-04-28 LAB — MICROALBUMIN / CREATININE URINE RATIO
Creatinine,U: 86.3 mg/dL
Microalb Creat Ratio: 2.2 mg/g (ref 0.0–30.0)
Microalb, Ur: 1.9 mg/dL (ref 0.0–1.9)

## 2022-04-28 LAB — VITAMIN B12: Vitamin B-12: 230 pg/mL (ref 211–911)

## 2022-04-28 LAB — HEMOGLOBIN A1C: Hgb A1c MFr Bld: 6 % (ref 4.6–6.5)

## 2022-04-28 LAB — TSH: TSH: 2.57 u[IU]/mL (ref 0.35–5.50)

## 2022-04-28 MED ORDER — CIPROFLOXACIN HCL 500 MG PO TABS: 500.0000 mg | ORAL_TABLET | Freq: Two times a day (BID) | ORAL | 0 refills | Status: AC

## 2022-04-28 MED ORDER — VALSARTAN 80 MG PO TABS: 80.0000 mg | ORAL_TABLET | Freq: Every day | ORAL | 3 refills | Status: DC

## 2022-04-28 NOTE — Progress Notes (Signed)
Patient ID: [name], female   DOB: 1945/11/28, 77 y.o.   MRN: [id number]         Chief Complaint:: wellness exam and hyperglycemia, hld, htn       HPI:  [name] is a 77 y.o. female here for wellness exam; dclines tdap, covid booster, pneumovax, but for shingirx at pharmacy o/w up to date,                         Also has been slowing physically, plans to get back to the gym soon for walking, has gained significant wt with less active recently, no falls but balance not as good.  Pt denies chest pain, increased sob or doe, wheezing, orthopnea, PND, increased LE swelling, palpitations, dizziness or syncope.   Pt denies polydipsia, polyuria, or new focal neuro s/s.    Pt denies fever, wt loss, night sweats, loss of appetite, or other constitutional symptoms     Wt Readings from Last 3 Encounters:  04/28/22 174 lb (78.9 kg)  04/03/22 160 lb (72.6 kg)  10/27/21 160 lb (72.6 kg)   BP Readings from Last 3 Encounters:  04/28/22 126/74  10/27/21 120/68  09/22/21 115/71   Immunization History  Administered Date(s) Administered   Moderna Sars-Covid-2 Vaccination 03/22/2019, 03/31/2019, 04/12/2019, 04/28/2019, 01/23/2020   Td 07/21/1995, 11/18/2009   Health Maintenance Due  Topic Date Due   DTaP/Tdap/Td (3 - Tdap) 11/19/2019      Past Medical History:  Diagnosis Date   Allergic rhinitis, cause unspecified 12/26/2010   Cataract    1 eye-? which   DEGENERATIVE JOINT DISEASE 11/25/2006   in knees    DEPRESSION 11/25/2006   DIVERTICULOSIS, COLON 12/04/2006   DIZZINESS 11/18/2009   DJD (degenerative joint disease)    Bilateral Hip   HYPERCHOLESTEROLEMIA 11/25/2006   HYPERLIPIDEMIA 11/18/2009   HYPERPARATHYROIDISM, HX OF 11/25/2006   HYPERTENSION 07/15/2007   HYPOTHYROIDISM 11/25/2006   OBESITY 12/04/2006   OSTEOPENIA 11/25/2006   OTITIS MEDIA, ACUTE, LEFT 11/18/2009   PALPITATIONS, RECURRENT 07/20/2008   POSTMENOPAUSAL STATUS 11/25/2006   PULMONARY NODULE 11/25/2006    Vitamin D deficiency    Past Surgical History:  Procedure Laterality Date   ABDOMINAL HYSTERECTOMY     COLONOSCOPY     ENDOVENOUS ABLATION SAPHENOUS VEIN W/ LASER Right 09/04/2021   endovenous laser ablation right small saphenous vein and stab phlebectomy 10-20 incisions right leg by Harold Barban MD   left knee     PARATHYROIDECTOMY Right 11/20/2014   Procedure: RIGHT PARATHYROIDECTOMY;  Surgeon: Armandina Gemma, MD;  Location: [facility];  Service: General;  Laterality: Right;   POLYPECTOMY     right wrist     THYROID SURGERY     early 70's   TOTAL HIP ARTHROPLASTY     left 3/05, right 2002   TOTAL HIP ARTHROPLASTY Left 2005   TOTAL KNEE ARTHROPLASTY Left 08/26/2020   Procedure: TOTAL KNEE ARTHROPLASTY;  Surgeon: Gaynelle Arabian, MD;  Location: [facility];  Service: Orthopedics;  Laterality: Left;  [id number]   UMBILICAL HERNIA REPAIR      reports that she quit smoking about 54 years ago. Her smoking use included cigarettes. She has never been exposed to tobacco smoke. She quit smokeless tobacco use about 47 years ago. She reports that she does not drink alcohol and does not use drugs. family history includes Breast cancer in her mother; Hypertension in an other family member; Stroke in her father. No Known Allergies Current  Outpatient Medications on File Prior to Visit  Medication Sig Dispense Refill   alendronate (FOSAMAX) 70 MG tablet TAKE 1 TABLET ONCE EVERY WEEK (ON SUNDAY MORNINGS) WITH A FULL GLASS OF WATER ON AN EMPTY STOMACH 12 tablet 1   aspirin 325 MG EC tablet Take 1 tablet (325 mg total) by mouth daily. (Patient taking differently: Take 325 mg by mouth daily as needed for pain (headache).) 90 tablet 3   cholecalciferol (VITAMIN D) 1000 UNITS tablet Take 1,000 Units by mouth daily.     levothyroxine (SYNTHROID) 75 MCG tablet TAKE 1 TABLET EVERY DAY 90 tablet 3   lovastatin (MEVACOR) 20 MG tablet TAKE 1 TABLET EVERY DAY 90 tablet 3   psyllium (HYDROCIL/METAMUCIL) 95 % PACK Take 1 packet by  mouth daily as needed (constipation).      furosemide (LASIX) 20 MG tablet TAKE 1 TABLET(20 MG) BY MOUTH DAILY AS NEEDED (Patient not taking: Reported on 04/03/2022) 30 tablet 5   No current facility-administered medications on file prior to visit.        ROS:  All others reviewed and negative.  Objective        PE:  BP 126/74   Pulse 83   Temp 98.2 F (36.8 C) (Oral)   Ht 4\' 11"  (1.499 m)   Wt 174 lb (78.9 kg)   SpO2 97%   BMI 35.14 kg/m                 Constitutional: Pt appears in NAD               HENT: Head: NCAT.                Right Ear: External ear normal.                 Left Ear: External ear normal.                Eyes: . Pupils are equal, round, and reactive to light. Conjunctivae and EOM are normal               Nose: without d/c or deformity               Neck: Neck supple. Gross normal ROM               Cardiovascular: Normal rate and regular rhythm.                 Pulmonary/Chest: Effort normal and breath sounds without rales or wheezing.                Abd:  Soft, NT, ND, + BS, no organomegaly               Neurological: Pt is alert. At baseline orientation, motor grossly intact               Skin: Skin is warm. No rashes, no other new lesions, LE edema - none               Psychiatric: Pt behavior is normal without agitation   Micro: none  Cardiac tracings I have personally interpreted today:  none  Pertinent Radiological findings (summarize): none   Lab Results  Component Value Date   WBC 3.0 (L) 04/28/2022   HGB 12.2 04/28/2022   HCT 37.1 04/28/2022   PLT 233.0 04/28/2022   GLUCOSE 90 04/28/2022   CHOL 175 04/28/2022   TRIG 63.0 04/28/2022   HDL 64.00 04/28/2022  LDLDIRECT 118.5 01/01/2012   LDLCALC 98 04/28/2022   ALT 12 04/28/2022   AST 21 04/28/2022   NA 140 04/28/2022   K 4.6 04/28/2022   CL 104 04/28/2022   CREATININE 0.71 04/28/2022   BUN 14 04/28/2022   CO2 29 04/28/2022   TSH 2.57 04/28/2022   INR 1.1 08/16/2020   HGBA1C 6.0  04/28/2022   MICROALBUR 1.9 04/28/2022   Assessment/Plan:  [name] is a 77 y.o. Black or African American [2] female with  has a past medical history of Allergic rhinitis, cause unspecified (12/26/2010), Cataract, DEGENERATIVE JOINT DISEASE (11/25/2006), DEPRESSION (11/25/2006), DIVERTICULOSIS, COLON (12/04/2006), DIZZINESS (11/18/2009), DJD (degenerative joint disease), HYPERCHOLESTEROLEMIA (11/25/2006), HYPERLIPIDEMIA (11/18/2009), HYPERPARATHYROIDISM, HX OF (11/25/2006), HYPERTENSION (07/15/2007), HYPOTHYROIDISM (11/25/2006), OBESITY (12/04/2006), OSTEOPENIA (11/25/2006), OTITIS MEDIA, ACUTE, LEFT (11/18/2009), PALPITATIONS, RECURRENT (07/20/2008), POSTMENOPAUSAL STATUS (11/25/2006), PULMONARY NODULE (11/25/2006), and Vitamin D deficiency.  Encounter for well adult exam with abnormal findings Age and sex appropriate education and counseling updated with regular exercise and diet Referrals for preventative services - none needed Immunizations addressed - declines tdap, covid booster, pnemovax, for shingrix at pharmacy Smoking counseling  - none needed Evidence for depression or other mood disorder - none significant Most recent labs reviewed. I have personally reviewed and have noted: 1) the patient's medical and social history 2) The patient's current medications and supplements 3) The patient's height, weight, and BMI have been recorded in the chart   Essential hypertension BP Readings from Last 3 Encounters:  04/28/22 126/74  10/27/21 120/68  09/22/21 115/71   Stable, pt to continue medical treatment diovan 80 mg qd   Hyperglycemia Lab Results  Component Value Date   HGBA1C 6.0 04/28/2022   Stable, pt to continue current medical treatment  - diet, wt control   Hyperlipidemia Lab Results  Component Value Date   LDLCALC 98 04/28/2022   Stable, pt to continue current statin lovastatin 20 mg qd  Followup: Return in about 6 months (around 10/29/2022).  Cathlean Cower,  MD 05/01/2022 5:08 AM [facility]

## 2022-04-28 NOTE — Patient Instructions (Signed)

## 2022-05-01 ENCOUNTER — Encounter: Payer: Self-pay | Admitting: Internal Medicine

## 2022-05-01 NOTE — Assessment & Plan Note (Signed)
Age and sex appropriate education and counseling updated with regular exercise and diet Referrals for preventative services - none needed Immunizations addressed - declines tdap, covid booster, pnemovax, for shingrix at pharmacy Smoking counseling  - none needed Evidence for depression or other mood disorder - none significant Most recent labs reviewed. I have personally reviewed and have noted: 1) the patient's medical and social history 2) The patient's current medications and supplements 3) The patient's height, weight, and BMI have been recorded in the chart

## 2022-05-01 NOTE — Assessment & Plan Note (Signed)
BP Readings from Last 3 Encounters:  04/28/22 126/74  10/27/21 120/68  09/22/21 115/71   Stable, pt to continue medical treatment diovan 80 mg qd

## 2022-05-01 NOTE — Assessment & Plan Note (Signed)
Lab Results  Component Value Date   HGBA1C 6.0 04/28/2022   Stable, pt to continue current medical treatment  - diet, wt control

## 2022-05-01 NOTE — Assessment & Plan Note (Signed)
Lab Results  Component Value Date   LDLCALC 98 04/28/2022   Stable, pt to continue current statin lovastatin 20 mg qd

## 2022-05-02 NOTE — Addendum Note (Signed)
Addended by: Biagio Borg on: 05/02/2022 05:18 PM   Modules accepted: Level of Service

## 2022-05-15 ENCOUNTER — Ambulatory Visit
Admission: RE | Admit: 2022-05-15 | Discharge: 2022-05-15 | Disposition: A | Discharge status: Home / Self Care | Payer: Medicare HMO | Source: Ambulatory Visit | Attending: Obstetrics and Gynecology | Admitting: Obstetrics and Gynecology

## 2022-05-15 DIAGNOSIS — Z1231 Encounter for screening mammogram for malignant neoplasm of breast: Secondary | ICD-10-CM

## 2022-05-19 ENCOUNTER — Other Ambulatory Visit: Payer: Self-pay | Admitting: Obstetrics and Gynecology

## 2022-05-19 DIAGNOSIS — R928 Other abnormal and inconclusive findings on diagnostic imaging of breast: Secondary | ICD-10-CM

## 2022-06-03 ENCOUNTER — Ambulatory Visit
Admission: RE | Admit: 2022-06-03 | Discharge: 2022-06-03 | Disposition: A | Discharge status: Home / Self Care | Payer: Medicare HMO | Source: Ambulatory Visit | Attending: Obstetrics and Gynecology | Admitting: Obstetrics and Gynecology

## 2022-06-03 DIAGNOSIS — N6489 Other specified disorders of breast: Secondary | ICD-10-CM | POA: Diagnosis not present

## 2022-06-03 DIAGNOSIS — R928 Other abnormal and inconclusive findings on diagnostic imaging of breast: Secondary | ICD-10-CM

## 2022-06-12 ENCOUNTER — Other Ambulatory Visit: Payer: Self-pay | Admitting: Obstetrics and Gynecology

## 2022-06-12 DIAGNOSIS — N6320 Unspecified lump in the left breast, unspecified quadrant: Secondary | ICD-10-CM

## 2022-07-06 ENCOUNTER — Telehealth: Payer: Self-pay | Admitting: Internal Medicine

## 2022-07-06 NOTE — Telephone Encounter (Signed)
Form placed on providers desk 

## 2022-07-06 NOTE — Telephone Encounter (Signed)
Patient's daughter dropped off document  [employer] Forms , to be filled out by provider. Patient requested to send it via Call Patient to pick up within 7-days. Document is located in providers tray at front office.Please advise at Mobile [phone] (mobile)

## 2022-07-09 NOTE — Telephone Encounter (Signed)
Called and left voicemail letting PT know form has been completed and placed up front for pick up.

## 2022-10-07 ENCOUNTER — Other Ambulatory Visit: Payer: Self-pay | Admitting: Internal Medicine

## 2022-10-07 ENCOUNTER — Other Ambulatory Visit: Payer: Self-pay

## 2022-10-08 ENCOUNTER — Other Ambulatory Visit: Payer: Self-pay | Admitting: Internal Medicine

## 2022-10-08 ENCOUNTER — Other Ambulatory Visit: Payer: Self-pay

## 2022-10-24 IMAGING — CR DG HIP (WITH OR WITHOUT PELVIS) 2-3V*R*
3 series · 3 of 3 positions shown · non-contrast
Comparison: 01/29/2006

CLINICAL DATA: Fall, hip dislocation

EXAM:
DG HIP (WITH OR WITHOUT PELVIS) 2-3V RIGHT

[x pelvis (1 of 2)]
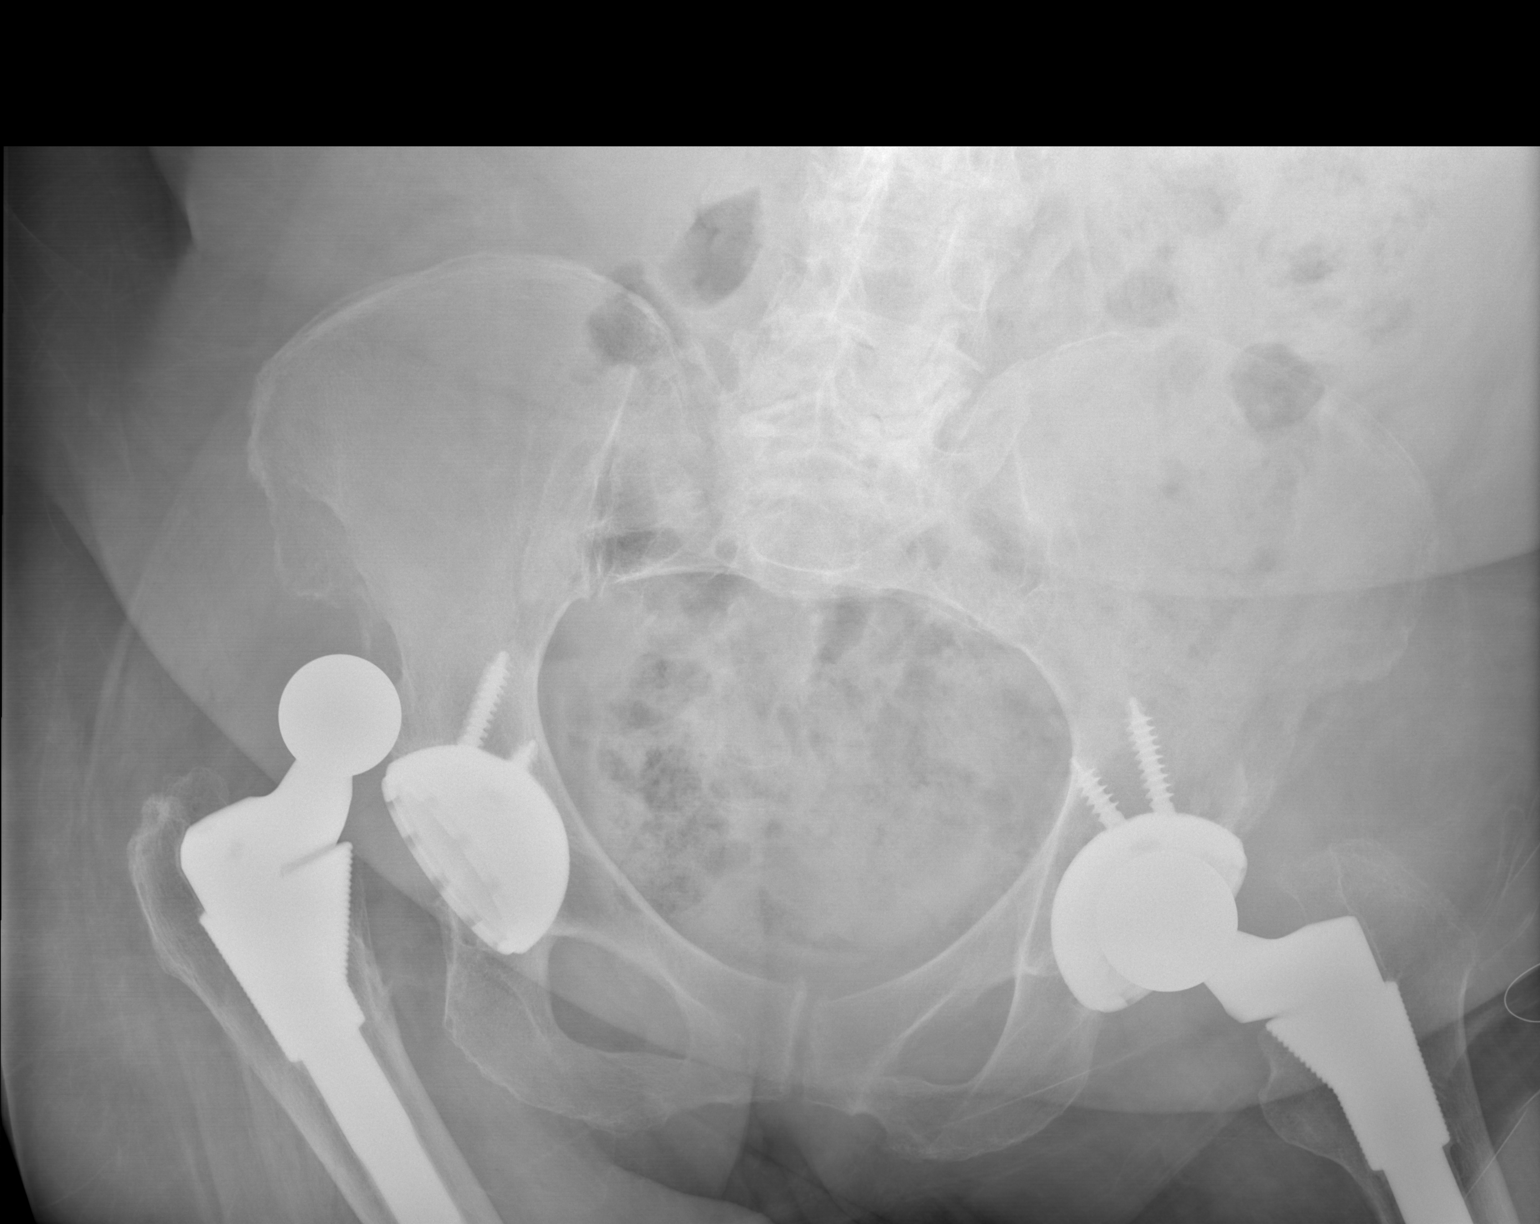

[x pelvis (2 of 2)]
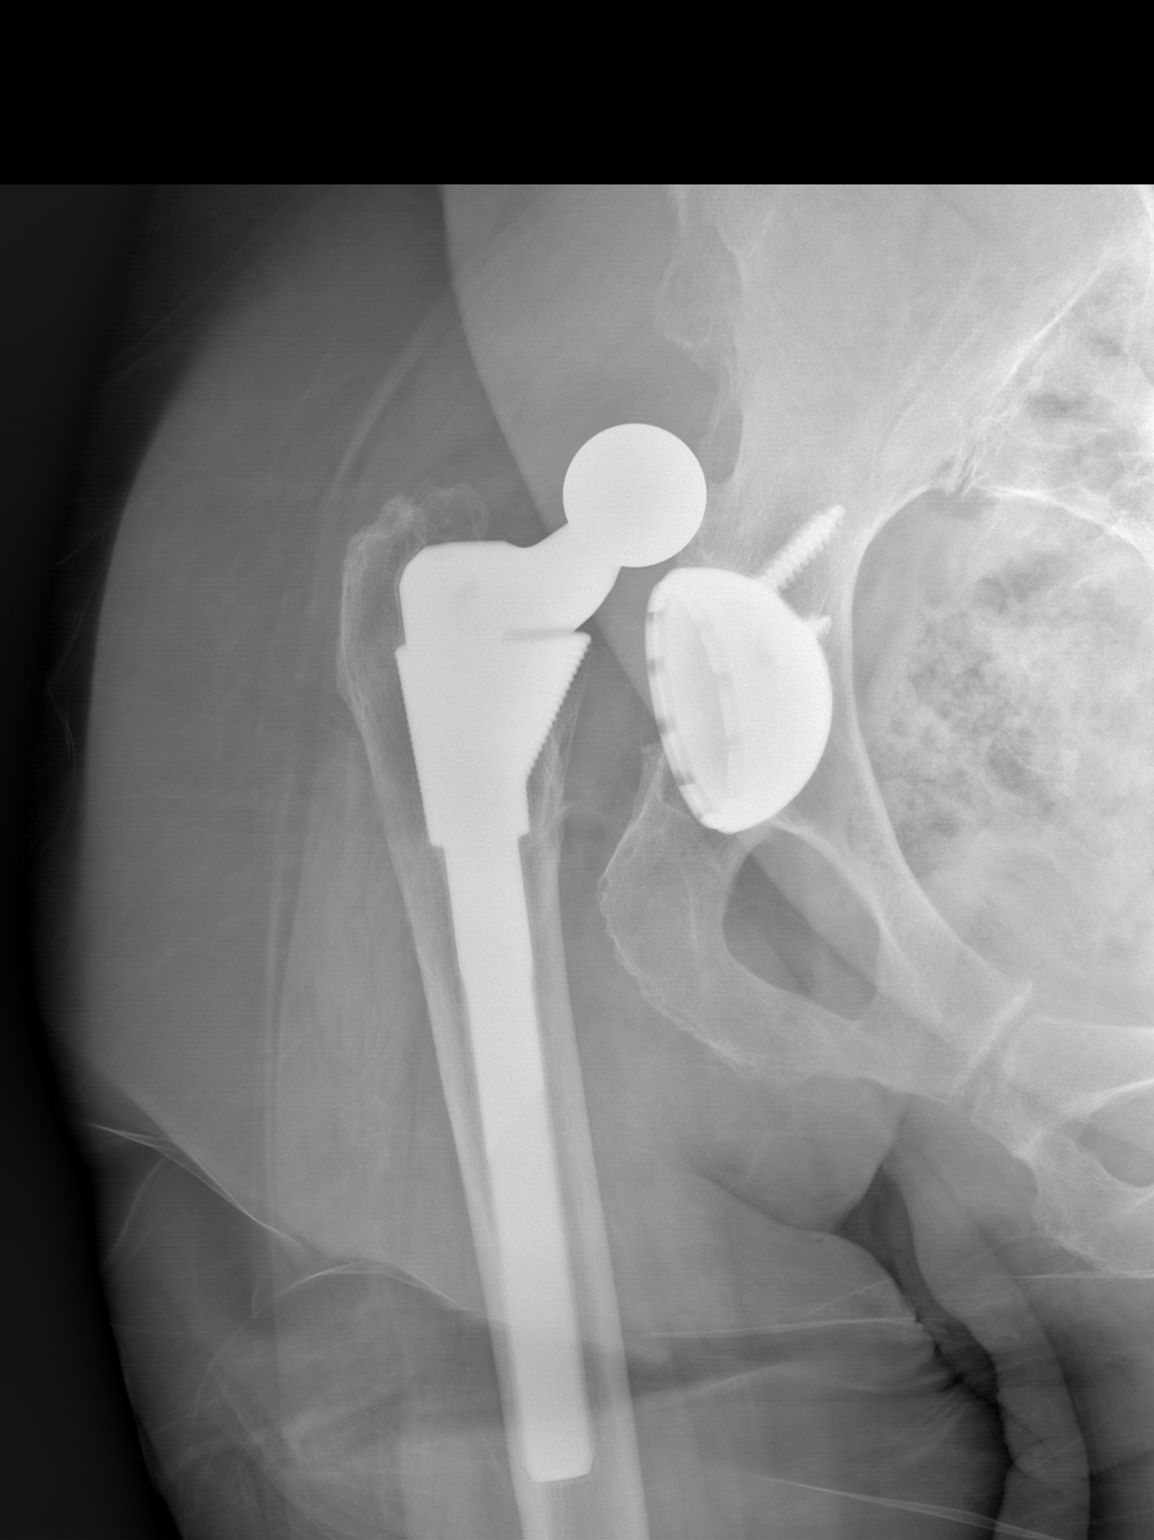

[w hip lat right]
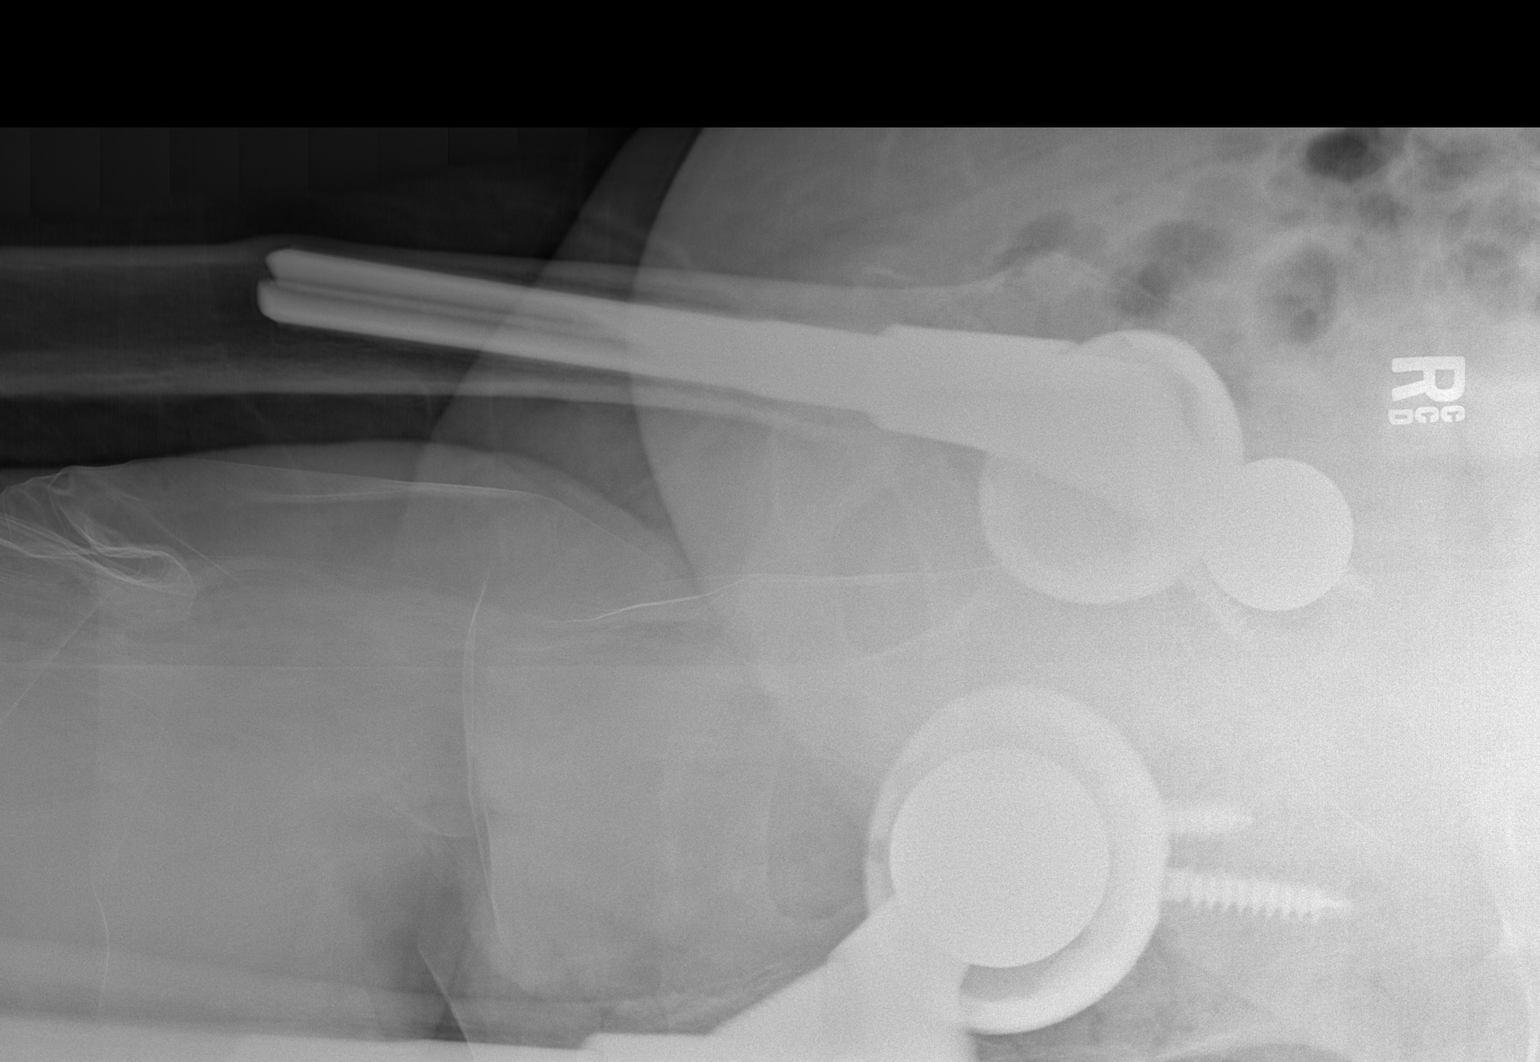

[3 of 3 positions shown; findings below may reference images not displayed]

FINDINGS: There is superior dislocation of right hip arthroplasty. No obvious
associated fracture. The left hip arthroplasty remains in expected
apposition. No displaced fracture of the bony pelvis. Nonobstructive
pattern of overlying bowel gas.
IMPRESSION: There is superior dislocation of right hip arthroplasty. No obvious
associated fracture.

## 2022-10-24 IMAGING — DX DG HIP (WITH OR WITHOUT PELVIS) 1V PORT*R*
1 series · 1 of 1 positions shown · non-contrast
Comparison: Radiographs earlier the same date and 01/29/2006.

CLINICAL DATA: Prosthetic right hip dislocation.  Postreduction.

EXAM:
DG HIP (WITH OR WITHOUT PELVIS) 1V PORT RIGHT

[pelvis ap]
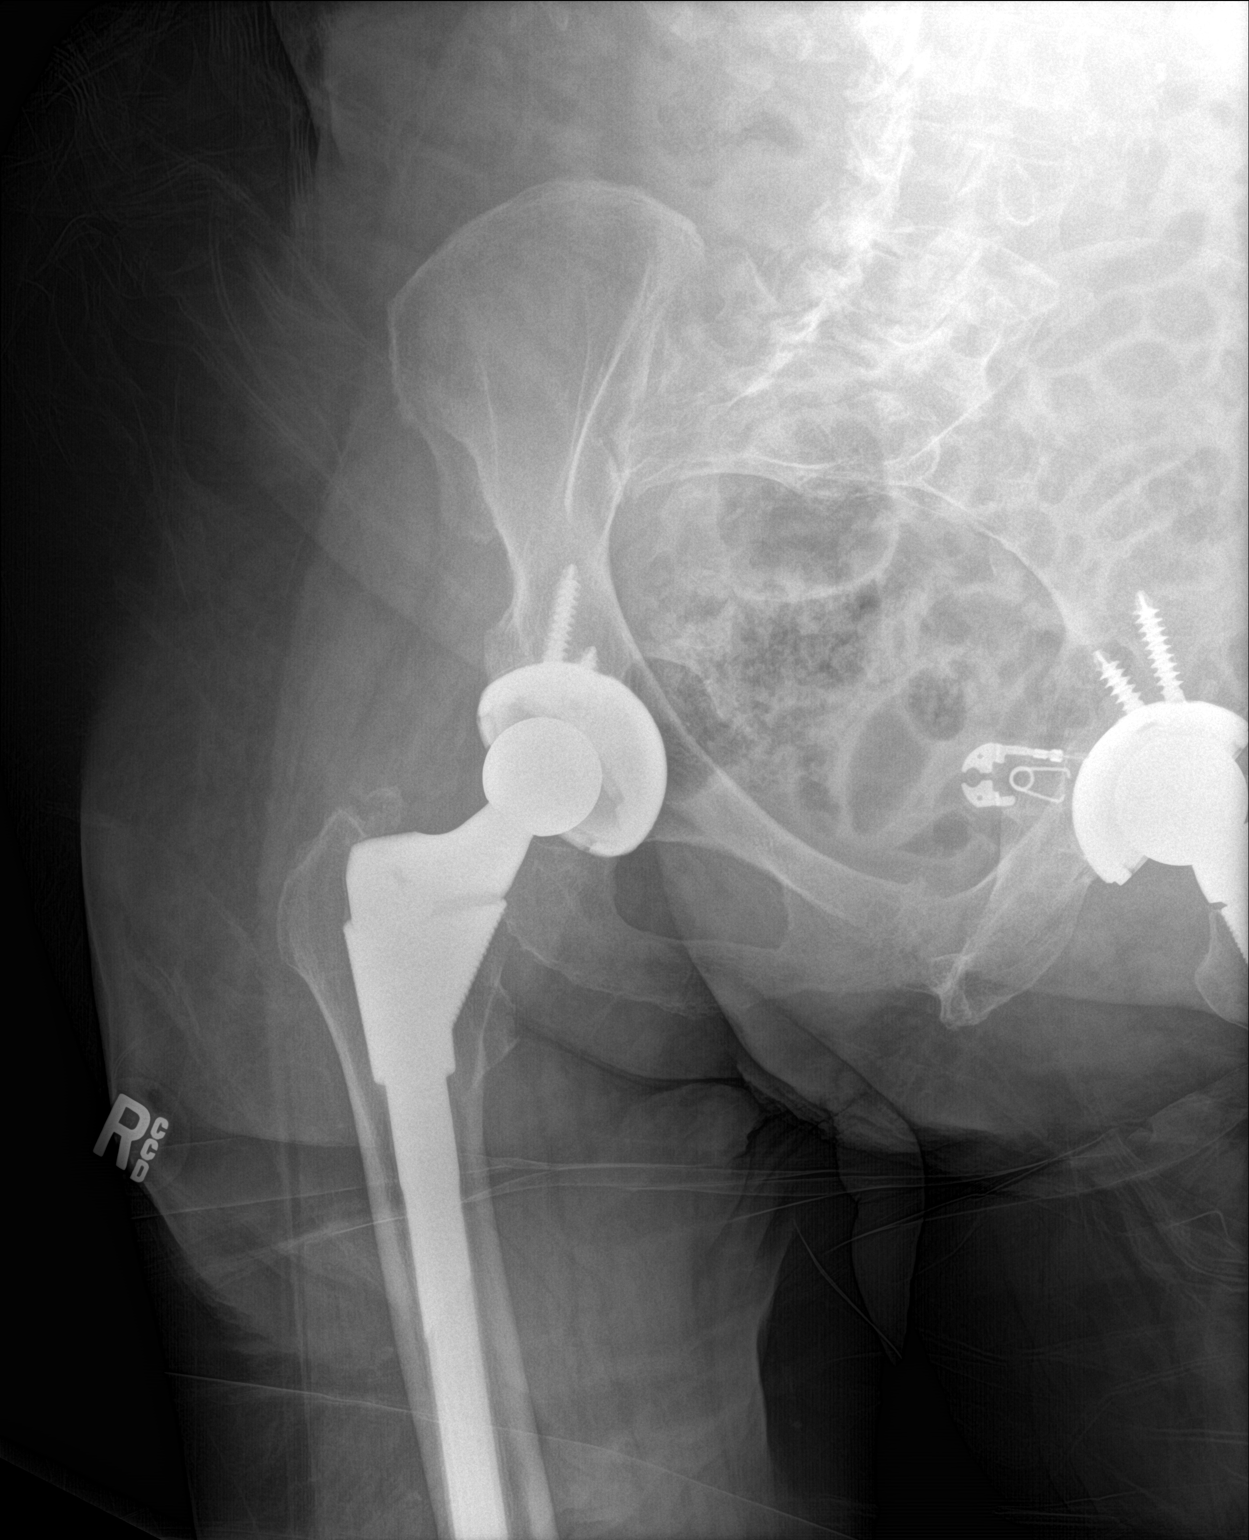

[1 of 1 positions shown; findings below may reference images not displayed]

FINDINGS: 5518 hours. Single AP view demonstrates apparent successful
reduction of the dislocated right total hip arthroplasty. No
fractures are identified. The distal end of the femoral prosthesis
is not imaged. Left total hip arthroplasty is partially imaged.
IMPRESSION: Based on single AP view, there is apparent successful reduction of
the dislocated right total hip arthroplasty (suboptimally evaluated
in a single projection).

## 2023-02-05 ENCOUNTER — Other Ambulatory Visit: Payer: Self-pay

## 2023-02-05 ENCOUNTER — Other Ambulatory Visit: Payer: Self-pay | Admitting: Internal Medicine

## 2023-02-23 ENCOUNTER — Ambulatory Visit (INDEPENDENT_AMBULATORY_CARE_PROVIDER_SITE_OTHER): Payer: Medicare HMO

## 2023-02-23 ENCOUNTER — Ambulatory Visit (INDEPENDENT_AMBULATORY_CARE_PROVIDER_SITE_OTHER): Payer: Medicare HMO | Admitting: Internal Medicine

## 2023-02-23 ENCOUNTER — Encounter: Payer: Self-pay | Admitting: Internal Medicine

## 2023-02-23 VITALS — BP 130/84 | HR 94 | Temp 98.2°F | Ht 59.0 in | Wt 172.0 lb

## 2023-02-23 DIAGNOSIS — G8929 Other chronic pain: Secondary | ICD-10-CM | POA: Diagnosis not present

## 2023-02-23 DIAGNOSIS — E559 Vitamin D deficiency, unspecified: Secondary | ICD-10-CM | POA: Diagnosis not present

## 2023-02-23 DIAGNOSIS — I10 Essential (primary) hypertension: Secondary | ICD-10-CM

## 2023-02-23 DIAGNOSIS — Z0001 Encounter for general adult medical examination with abnormal findings: Secondary | ICD-10-CM | POA: Diagnosis not present

## 2023-02-23 DIAGNOSIS — E039 Hypothyroidism, unspecified: Secondary | ICD-10-CM

## 2023-02-23 DIAGNOSIS — R413 Other amnesia: Secondary | ICD-10-CM | POA: Diagnosis not present

## 2023-02-23 DIAGNOSIS — M545 Low back pain, unspecified: Secondary | ICD-10-CM | POA: Diagnosis not present

## 2023-02-23 DIAGNOSIS — M810 Age-related osteoporosis without current pathological fracture: Secondary | ICD-10-CM

## 2023-02-23 DIAGNOSIS — R739 Hyperglycemia, unspecified: Secondary | ICD-10-CM | POA: Diagnosis not present

## 2023-02-23 DIAGNOSIS — E7800 Pure hypercholesterolemia, unspecified: Secondary | ICD-10-CM

## 2023-02-23 DIAGNOSIS — E538 Deficiency of other specified B group vitamins: Secondary | ICD-10-CM

## 2023-02-23 LAB — URINALYSIS, ROUTINE W REFLEX MICROSCOPIC
Bilirubin Urine: NEGATIVE
Hgb urine dipstick: NEGATIVE
Leukocytes,Ua: NEGATIVE
Nitrite: NEGATIVE
Specific Gravity, Urine: 1.025 (ref 1.000–1.030)
Urine Glucose: NEGATIVE
Urobilinogen, UA: 2 — AB (ref 0.0–1.0)
pH: 6 (ref 5.0–8.0)

## 2023-02-23 LAB — BASIC METABOLIC PANEL
BUN: 14 mg/dL (ref 6–23)
CO2: 28 meq/L (ref 19–32)
Calcium: 9.4 mg/dL (ref 8.4–10.5)
Chloride: 104 meq/L (ref 96–112)
Creatinine, Ser: 0.64 mg/dL (ref 0.40–1.20)
GFR: 84 mL/min (ref >60.00)
Glucose, Bld: 82 mg/dL (ref 70–99)
Potassium: 3.8 meq/L (ref 3.5–5.1)
Sodium: 142 meq/L (ref 135–145)

## 2023-02-23 LAB — HEPATIC FUNCTION PANEL
ALT: 7 U/L (ref 0–35)
AST: 17 U/L (ref 0–37)
Albumin: 4.3 g/dL (ref 3.5–5.2)
Alkaline Phosphatase: 46 U/L (ref 39–117)
Bilirubin, Direct: 0.1 mg/dL (ref 0.0–0.3)
Total Bilirubin: 0.5 mg/dL (ref 0.2–1.2)
Total Protein: 7.7 g/dL (ref 6.0–8.3)

## 2023-02-23 LAB — HEMOGLOBIN A1C: Hgb A1c MFr Bld: 6.2 % (ref 4.6–6.5)

## 2023-02-23 LAB — VITAMIN D 25 HYDROXY (VIT D DEFICIENCY, FRACTURES): VITD: 40.78 ng/mL (ref 30.00–100.00)

## 2023-02-23 LAB — TSH: TSH: 4.41 u[IU]/mL (ref 0.35–5.50)

## 2023-02-23 LAB — CBC WITH DIFFERENTIAL/PLATELET
Basophils Absolute: 0.1 10*3/uL (ref 0.0–0.1)
Basophils Relative: 3.8 % — ABNORMAL HIGH (ref 0.0–3.0)
Eosinophils Absolute: 0.2 10*3/uL (ref 0.0–0.7)
Eosinophils Relative: 4.1 % (ref 0.0–5.0)
HCT: 39.3 % (ref 36.0–46.0)
Hemoglobin: 13.1 g/dL (ref 12.0–15.0)
Lymphocytes Relative: 14.6 % (ref 12.0–46.0)
Lymphs Abs: 0.6 10*3/uL — ABNORMAL LOW (ref 0.7–4.0)
MCHC: 33.3 g/dL (ref 30.0–36.0)
MCV: 89.5 fL (ref 78.0–100.0)
Monocytes Absolute: 0.5 10*3/uL (ref 0.1–1.0)
Monocytes Relative: 12.1 % — ABNORMAL HIGH (ref 3.0–12.0)
Neutro Abs: 2.6 10*3/uL (ref 1.4–7.7)
Neutrophils Relative %: 65.4 % (ref 43.0–77.0)
Platelets: 258 10*3/uL (ref 150.0–400.0)
RBC: 4.39 Mil/uL (ref 3.87–5.11)
RDW: 13.7 % (ref 11.5–15.5)
WBC: 3.9 10*3/uL — ABNORMAL LOW (ref 4.0–10.5)

## 2023-02-23 LAB — VITAMIN B12: Vitamin B-12: 221 pg/mL (ref 211–911)

## 2023-02-23 LAB — LIPID PANEL
Cholesterol: 199 mg/dL (ref 0–200)
HDL: 72.9 mg/dL (ref >39.00)
LDL Cholesterol: 114 mg/dL — ABNORMAL HIGH (ref 0–99)
NonHDL: 125.81
Total CHOL/HDL Ratio: 3
Triglycerides: 59 mg/dL (ref 0.0–149.0)
VLDL: 11.8 mg/dL (ref 0.0–40.0)

## 2023-02-23 MED ORDER — TRAMADOL HCL 50 MG PO TABS: 50.0000 mg | ORAL_TABLET | Freq: Four times a day (QID) | ORAL | 0 refills | Status: DC | PRN

## 2023-02-23 NOTE — Assessment & Plan Note (Signed)
 BP Readings from Last 3 Encounters:  02/23/23 130/84  04/28/22 126/74  10/27/21 120/68   Stable, pt to continue medical treatment diovan 80 qd

## 2023-02-23 NOTE — Assessment & Plan Note (Signed)
Age and sex appropriate education and counseling updated with regular exercise and diet Referrals for preventative services - none needed Immunizations addressed - declines all Smoking counseling  - none needed Evidence for depression or other mood disorder - none significant Most recent labs reviewed. I have personally reviewed and have noted: 1) the patient's medical and social history 2) The patient's current medications and supplements 3) The patient's height, weight, and BMI have been recorded in the chart

## 2023-02-23 NOTE — Assessment & Plan Note (Signed)
 Moderate for tramadol prn, also ls spine xray, consider MRI if compression fx found and/or NS referral

## 2023-02-23 NOTE — Assessment & Plan Note (Signed)
 Lab Results  Component Value Date   LDLCALC 114 (H) 02/23/2023   Uncontrolled, for lower chol diet,, pt to continue current statin lovastatin 20 every day, delcines change for now

## 2023-02-23 NOTE — Assessment & Plan Note (Signed)
 Lab Results  Component Value Date   HGBA1C 6.2 02/23/2023   Stable, pt to continue current medical treatment  - diet, wt control

## 2023-02-23 NOTE — Patient Instructions (Addendum)
 Ok to stop the fosamax  (alendronate )  Please take all new medication as prescribed - the tramadol  as needed for pain (and call in one week if you need more)  Please continue all other medications as before, and refills have been done if requested.  Please have the pharmacy call with any other refills you may need.  Please continue your efforts at being more active, low cholesterol diet, and weight control.  You are otherwise up to date with prevention measures today.  Please keep your appointments with your specialists as you may have planned  Please schedule the bone density test before leaving today at the scheduling desk (where you check out)  You will be contacted regarding the referral for: MRI for brain, and neurology  Please go to the XRAY Department in the first floor for the x-ray testing  Please go to the LAB at the blood drawing area for the tests to be done  You will be contacted by phone if any changes need to be made immediately.  Otherwise, you will receive a letter about your results with an explanation, but please check with MyChart first.  Please make an Appointment to return in 6 months, or sooner if needed

## 2023-02-23 NOTE — Progress Notes (Signed)
 Patient ID: [name], female   DOB: [birth year], 78 y.o.   MRN: [id number]         Chief Complaint:: wellness exam and Back Pain (Lower back pain has been going on for a couple of months in the middle of her lower back and has gotten worse )  , memory loss, osteoporosis       HPI:  [name] is a 78 y.o. female here for wellness exam; decliens immunizations, o/w up to date                        Also with c.o moderate intermittent lowest midline lbp sharp without radiation or LE symptoms, ongoing now for over 2 mo.  No falls.  Has hx of osteoporosis.  Also family with her mentions her worsening memory loss with asking repeated same questions.  Pt walking with walker, Pt denies chest pain, increased sob or doe, wheezing, orthopnea, PND, increased LE swelling, palpitations, dizziness or syncope.   Pt denies polydipsia, polyuria, or new focal neuro s/s.    Pt denies fever, wt loss, night sweats, loss of appetite, or other constitutional symptoms     Wt Readings from Last 3 Encounters:  02/23/23 172 lb (78 kg)  04/28/22 174 lb (78.9 kg)  04/03/22 160 lb (72.6 kg)   BP Readings from Last 3 Encounters:  02/23/23 130/84  04/28/22 126/74  10/27/21 120/68   Immunization History  Administered Date(s) Administered   Moderna Sars-Covid-2 Vaccination 03/22/2019, 03/31/2019, 04/12/2019, 04/28/2019, 01/23/2020   Td 07/21/1995, 11/18/2009   Health Maintenance Due  Topic Date Due   Zoster Vaccines- Shingrix (1 of 2) Never done   DTaP/Tdap/Td (3 - Tdap) 11/19/2019   COVID-19 Vaccine (6 - 2024-25 season) 10/18/2022   Medicare Annual Wellness (AWV)  04/04/2023      Past Medical History:  Diagnosis Date   Allergic rhinitis, cause unspecified 12/26/2010   Cataract    1 eye-? which   DEGENERATIVE JOINT DISEASE 11/25/2006   in knees    DEPRESSION 11/25/2006   DIVERTICULOSIS, COLON 12/04/2006   DIZZINESS 11/18/2009   DJD (degenerative joint disease)    Bilateral Hip    HYPERCHOLESTEROLEMIA 11/25/2006   HYPERLIPIDEMIA 11/18/2009   HYPERPARATHYROIDISM, HX OF 11/25/2006   HYPERTENSION 07/15/2007   HYPOTHYROIDISM 11/25/2006   OBESITY 12/04/2006   OSTEOPENIA 11/25/2006   OTITIS MEDIA, ACUTE, LEFT 11/18/2009   PALPITATIONS, RECURRENT 07/20/2008   POSTMENOPAUSAL STATUS 11/25/2006   PULMONARY NODULE 11/25/2006   Vitamin D  deficiency    Past Surgical History:  Procedure Laterality Date   ABDOMINAL HYSTERECTOMY     COLONOSCOPY     ENDOVENOUS ABLATION SAPHENOUS VEIN W/ LASER Right 09/04/2021   endovenous laser ablation right small saphenous vein and stab phlebectomy 10-20 incisions right leg by Gaile New MD   left knee     PARATHYROIDECTOMY Right 11/20/2014   Procedure: RIGHT PARATHYROIDECTOMY;  Surgeon: Krystal Spinner, MD;  Location: [facility] OR;  Service: General;  Laterality: Right;   POLYPECTOMY     right wrist     THYROID  SURGERY     early 70's   TOTAL HIP ARTHROPLASTY     left 3/05, right 2002   TOTAL HIP ARTHROPLASTY Left 2005   TOTAL KNEE ARTHROPLASTY Left 08/26/2020   Procedure: TOTAL KNEE ARTHROPLASTY;  Surgeon: Melodi Lerner, MD;  Location: [facility] ORS;  Service: Orthopedics;  Laterality: Left;    UMBILICAL HERNIA REPAIR      reports that she  quit smoking about 55 years ago. Her smoking use included cigarettes. She started smoking about 56 years ago. She has never been exposed to tobacco smoke. She quit smokeless tobacco use about 48 years ago. She reports that she does not drink alcohol and does not use drugs. family history includes Breast cancer in her mother; Hypertension in an other family member; Stroke in her father. No Known Allergies Current Outpatient Medications on File Prior to Visit  Medication Sig Dispense Refill   aspirin  325 MG EC tablet Take 1 tablet (325 mg total) by mouth daily. (Patient taking differently: Take 325 mg by mouth daily as needed for pain (headache).) 90 tablet 3   cholecalciferol (VITAMIN D ) 1000 UNITS tablet  Take 1,000 Units by mouth daily.     furosemide  (LASIX ) 20 MG tablet TAKE 1 TABLET(20 MG) BY MOUTH DAILY AS NEEDED 30 tablet 5   levothyroxine  (SYNTHROID ) 75 MCG tablet TAKE 1 TABLET EVERY DAY 90 tablet 3   lovastatin  (MEVACOR ) 20 MG tablet TAKE 1 TABLET EVERY DAY 90 tablet 3   psyllium (HYDROCIL/METAMUCIL) 95 % PACK Take 1 packet by mouth daily as needed (constipation).      valsartan  (DIOVAN ) 80 MG tablet Take 1 tablet (80 mg total) by mouth daily. 90 tablet 3   No current facility-administered medications on file prior to visit.        ROS:  All others reviewed and negative.  Objective        PE:  BP 130/84 (BP Location: Right Arm, Patient Position: Sitting, Cuff Size: Normal)   Pulse 94   Temp 98.2 F (36.8 C) (Oral)   Ht 4' 11 (1.499 m)   Wt 172 lb (78 kg)   SpO2 96%   BMI 34.74 kg/m                 Constitutional: Pt appears in NAD               HENT: Head: NCAT.                Right Ear: External ear normal.                 Left Ear: External ear normal.                Eyes: . Pupils are equal, round, and reactive to light. Conjunctivae and EOM are normal               Nose: without d/c or deformity               Neck: Neck supple. Gross normal ROM               Cardiovascular: Normal rate and regular rhythm.                 Pulmonary/Chest: Effort normal and breath sounds without rales or wheezing.                Abd:  Soft, NT, ND, + BS, no organomegaly               Neurological: Pt is alert. At baseline orientation, motor grossly intact               Skin: Skin is warm. No rashes, no other new lesions, LE edema - none; spine mild tender at lowest lumbar midline without sweling               Psychiatric: Pt behavior is normal  without agitation   Micro: none  Cardiac tracings I have personally interpreted today:  none  Pertinent Radiological findings (summarize): none   Lab Results  Component Value Date   WBC 3.9 (L) 02/23/2023   HGB 13.1 02/23/2023   HCT 39.3  02/23/2023   PLT 258.0 02/23/2023   GLUCOSE 82 02/23/2023   CHOL 199 02/23/2023   TRIG 59.0 02/23/2023   HDL 72.90 02/23/2023   LDLDIRECT 118.5 01/01/2012   LDLCALC 114 (H) 02/23/2023   ALT 7 02/23/2023   AST 17 02/23/2023   NA 142 02/23/2023   K 3.8 02/23/2023   CL 104 02/23/2023   CREATININE 0.64 02/23/2023   BUN 14 02/23/2023   CO2 28 02/23/2023   TSH 4.41 02/23/2023   INR 1.1 08/16/2020   HGBA1C 6.2 02/23/2023   MICROALBUR 1.9 04/28/2022   Assessment/Plan:  [name] is a 78 y.o. Black or African American [2] female with  has a past medical history of Allergic rhinitis, cause unspecified (12/26/2010), Cataract, DEGENERATIVE JOINT DISEASE (11/25/2006), DEPRESSION (11/25/2006), DIVERTICULOSIS, COLON (12/04/2006), DIZZINESS (11/18/2009), DJD (degenerative joint disease), HYPERCHOLESTEROLEMIA (11/25/2006), HYPERLIPIDEMIA (11/18/2009), HYPERPARATHYROIDISM, HX OF (11/25/2006), HYPERTENSION (07/15/2007), HYPOTHYROIDISM (11/25/2006), OBESITY (12/04/2006), OSTEOPENIA (11/25/2006), OTITIS MEDIA, ACUTE, LEFT (11/18/2009), PALPITATIONS, RECURRENT (07/20/2008), POSTMENOPAUSAL STATUS (11/25/2006), PULMONARY NODULE (11/25/2006), and Vitamin D  deficiency.  Encounter for well adult exam with abnormal findings Age and sex appropriate education and counseling updated with regular exercise and diet Referrals for preventative services - none needed Immunizations addressed - declines all Smoking counseling  - none needed Evidence for depression or other mood disorder - none significant Most recent labs reviewed. I have personally reviewed and have noted: 1) the patient's medical and social history 2) The patient's current medications and supplements 3) The patient's height, weight, and BMI have been recorded in the chart   Hypothyroidism Lab Results  Component Value Date   TSH 4.41 02/23/2023   Stable, pt to continue levothyroxine  75 mcg qd   Hyperlipidemia Lab Results  Component  Value Date   LDLCALC 114 (H) 02/23/2023   Uncontrolled, for lower chol diet,, pt to continue current statin lovastatin  20 every day, delcines change for now   Essential hypertension BP Readings from Last 3 Encounters:  02/23/23 130/84  04/28/22 126/74  10/27/21 120/68   Stable, pt to continue medical treatment diovan  80 qd   Hyperglycemia Lab Results  Component Value Date   HGBA1C 6.2 02/23/2023   Stable, pt to continue current medical treatment  - diet, wt control   Low back pain Moderate for tramadol  prn, also ls spine xray, consider MRI if compression fx found and/or NS referral  Osteoporosis Has been on fosamax  for > 5 yrs - ok to d/c, for f/u dxa  Memory loss Recent onset worsening, for MRI brain, and refer neurology  Followup: Return in about 6 months (around 08/23/2023).  Lynwood Rush, MD 02/23/2023 8:41 PM [facility]

## 2023-02-23 NOTE — Assessment & Plan Note (Signed)
 Lab Results  Component Value Date   TSH 4.41 02/23/2023   Stable, pt to continue levothyroxine 75 mcg qd

## 2023-02-23 NOTE — Assessment & Plan Note (Signed)
 Has been on fosamax for > 5 yrs - ok to d/c, for f/u dxa

## 2023-02-23 NOTE — Progress Notes (Signed)
 The test results show that your current treatment is OK, as the tests are stable.  Please continue the same plan.  There is no other need for change of treatment or further evaluation based on these results, at this time.  thanks

## 2023-02-23 NOTE — Assessment & Plan Note (Signed)
 Recent onset worsening, for MRI brain, and refer neurology

## 2023-03-05 ENCOUNTER — Inpatient Hospital Stay: Admission: RE | Admit: 2023-03-05 | Payer: Medicare HMO | Source: Ambulatory Visit

## 2023-03-08 ENCOUNTER — Other Ambulatory Visit: Payer: Medicare HMO

## 2023-03-23 ENCOUNTER — Inpatient Hospital Stay: Admission: RE | Admit: 2023-03-23 | Payer: Medicare HMO | Source: Ambulatory Visit

## 2023-03-31 ENCOUNTER — Encounter: Payer: Self-pay | Admitting: Physician Assistant

## 2023-04-05 ENCOUNTER — Ambulatory Visit (INDEPENDENT_AMBULATORY_CARE_PROVIDER_SITE_OTHER): Payer: Medicare HMO

## 2023-04-05 VITALS — Ht 59.0 in | Wt 172.0 lb

## 2023-04-05 DIAGNOSIS — Z0000 Encounter for general adult medical examination without abnormal findings: Secondary | ICD-10-CM

## 2023-04-05 NOTE — Patient Instructions (Addendum)
 [name] , Thank you for taking time to come for your Medicare Wellness Visit. I appreciate your ongoing commitment to your health goals. Please review the following plan we discussed and let me know if I can assist you in the future.   Referrals/Orders/Follow-Ups/Clinician Recommendations: It was nice talking to you today.  Please call [facility], [phone], to schedule a Bone Density Screening.  Each day, aim for 6 glasses of water, plenty of protein in your diet and try to get up and walk/ stretch every hour for 5-10 minutes at a time.    This is a list of the screening recommended for you and due dates:  Health Maintenance  Topic Date Due   Zoster (Shingles) Vaccine (1 of 2) Never done   DTaP/Tdap/Td vaccine (3 - Tdap) 11/19/2019   COVID-19 Vaccine (6 - 2024-25 season) 10/18/2022   Medicare Annual Wellness Visit  04/04/2023   Pneumonia Vaccine (1 of 1 - PCV) 04/28/2023*   DEXA scan (bone density measurement)  Completed   Hepatitis C Screening  Completed   HPV Vaccine  Aged Out   Flu Shot  Discontinued   Colon Cancer Screening  Discontinued  *Topic was postponed. The date shown is not the original due date.    Advanced directives: (Declined) Advance directive discussed with you today. Even though you declined this today, please call our office should you change your mind, and we can give you the proper paperwork for you to fill out.  Next Medicare Annual Wellness Visit scheduled for next year: Yes

## 2023-04-05 NOTE — Progress Notes (Addendum)
 Subjective:   [name] is a 78 y.o. female who presents for Medicare Annual (Subsequent) preventive examination.  Visit Complete: Virtual I connected with  [name] on 04/05/23 by a audio enabled telemedicine application and verified that I am speaking with the correct person using two identifiers.  Patient Location: Home  Provider Location: Home Office  I discussed the limitations of evaluation and management by telemedicine. The patient expressed understanding and agreed to proceed.  Vital Signs: Because this visit was a virtual/telehealth visit, some criteria may be missing or patient reported. Any vitals not documented were not able to be obtained and vitals that have been documented are patient reported.   Cardiac Risk Factors include: advanced age (>69men, >61 women);hypertension;dyslipidemia;obesity (BMI >30kg/m2)     Objective:    Today's Vitals   04/05/23 0802  Weight: 172 lb (78 kg)  Height: 4\' 11"  (1.499 m)   Body mass index is 34.74 kg/m.     04/05/2023    8:24 AM 04/03/2022    2:06 PM 03/14/2021   10:44 AM 08/26/2020    1:15 PM 08/16/2020   11:18 AM 02/22/2020   11:24 AM 08/30/2018   11:37 AM  Advanced Directives  Does Patient Have a Medical Advance Directive? Yes No No No No No No  Type of Estate agent of [place];Living will        Copy of Healthcare Power of Attorney in Chart? No - copy requested        Would patient like information on creating a medical advance directive?  No - Patient declined Yes (ED - Information included in AVS) No - Patient declined  No - Patient declined Yes (ED - Information included in AVS)    Current Medications (verified) Outpatient Encounter Medications as of 04/05/2023  Medication Sig   aspirin 325 MG EC tablet Take 1 tablet (325 mg total) by mouth daily. (Patient taking differently: Take 325 mg by mouth daily as needed for pain (headache).)   cholecalciferol (VITAMIN D) 1000 UNITS tablet Take  1,000 Units by mouth daily.   furosemide (LASIX) 20 MG tablet TAKE 1 TABLET(20 MG) BY MOUTH DAILY AS NEEDED   levothyroxine (SYNTHROID) 75 MCG tablet TAKE 1 TABLET EVERY DAY   lovastatin (MEVACOR) 20 MG tablet TAKE 1 TABLET EVERY DAY   psyllium (HYDROCIL/METAMUCIL) 95 % PACK Take 1 packet by mouth daily as needed (constipation).    traMADol (ULTRAM) 50 MG tablet Take 1 tablet (50 mg total) by mouth every 6 (six) hours as needed.   valsartan (DIOVAN) 80 MG tablet Take 1 tablet (80 mg total) by mouth daily.   No facility-administered encounter medications on file as of 04/05/2023.    Allergies (verified) Patient has no known allergies.   History: Past Medical History:  Diagnosis Date   Allergic rhinitis, cause unspecified 12/26/2010   Cataract    1 eye-? which   DEGENERATIVE JOINT DISEASE 11/25/2006   in knees    DEPRESSION 11/25/2006   DIVERTICULOSIS, COLON 12/04/2006   DIZZINESS 11/18/2009   DJD (degenerative joint disease)    Bilateral Hip   HYPERCHOLESTEROLEMIA 11/25/2006   HYPERLIPIDEMIA 11/18/2009   HYPERPARATHYROIDISM, HX OF 11/25/2006   HYPERTENSION 07/15/2007   HYPOTHYROIDISM 11/25/2006   OBESITY 12/04/2006   OSTEOPENIA 11/25/2006   OTITIS MEDIA, ACUTE, LEFT 11/18/2009   PALPITATIONS, RECURRENT 07/20/2008   POSTMENOPAUSAL STATUS 11/25/2006   PULMONARY NODULE 11/25/2006   Vitamin D deficiency    Past Surgical History:  Procedure Laterality Date  ABDOMINAL HYSTERECTOMY     COLONOSCOPY     ENDOVENOUS ABLATION SAPHENOUS VEIN W/ LASER Right 09/04/2021   endovenous laser ablation right small saphenous vein and stab phlebectomy 10-20 incisions right leg by Coral Else MD   left knee     PARATHYROIDECTOMY Right 11/20/2014   Procedure: RIGHT PARATHYROIDECTOMY;  Surgeon: Darnell Level, MD;  Location: [facility] OR;  Service: General;  Laterality: Right;   POLYPECTOMY     right wrist     THYROID SURGERY     early 70's   TOTAL HIP ARTHROPLASTY     left 3/05, right 2002    TOTAL HIP ARTHROPLASTY Left 2005   TOTAL KNEE ARTHROPLASTY Left 08/26/2020   Procedure: TOTAL KNEE ARTHROPLASTY;  Surgeon: Ollen Gross, MD;  Location: [facility] ORS;  Service: Orthopedics;  Laterality: Left;    UMBILICAL HERNIA REPAIR     Family History  Problem Relation Age of Onset   Breast cancer Mother    Stroke Father    Hypertension Other    Colon cancer Neg Hx    Esophageal cancer Neg Hx    Rectal cancer Neg Hx    Stomach cancer Neg Hx    Social History   Socioeconomic History   Marital status: Widowed    Spouse name: Not on file   Number of children: 2   Years of education: Not on file   Highest education level: Not on file  Occupational History   Occupation: retired  Tobacco Use   Smoking status: Former    Current packs/day: 0.00    Types: Cigarettes    Start date: 02/17/1967    Quit date: 02/17/1968    Years since quitting: 55.1    Passive exposure: Never   Smokeless tobacco: Former    Quit date: 11/12/1974   Tobacco comments:    tried snuff as a child, casual smoker as young adult  Vaping Use   Vaping status: Never Used  Substance and Sexual Activity   Alcohol use: No    Alcohol/week: 0.0 standard drinks of alcohol   Drug use: No   Sexual activity: Never  Other Topics Concern   Not on file  Social History Narrative   Lives alone-2025   Social Drivers of Health   Financial Resource Strain: Low Risk  (04/05/2023)   Overall Financial Resource Strain (CARDIA)    Difficulty of Paying Living Expenses: Not hard at all  Food Insecurity: No Food Insecurity (04/05/2023)   Hunger Vital Sign    Worried About Running Out of Food in the Last Year: Never true    Ran Out of Food in the Last Year: Never true  Transportation Needs: No Transportation Needs (04/05/2023)   PRAPARE - Administrator, Civil Service (Medical): No    Lack of Transportation (Non-Medical): No  Physical Activity: Inactive (04/05/2023)   Exercise Vital Sign    Days of Exercise per  Week: 0 days    Minutes of Exercise per Session: 0 min  Stress: No Stress Concern Present (04/05/2023)   [employer] of Occupational Health - Occupational Stress Questionnaire    Feeling of Stress : Not at all  Social Connections: Moderately Isolated (04/05/2023)   Social Connection and Isolation Panel [NHANES]    Frequency of Communication with Friends and Family: More than three times a week    Frequency of Social Gatherings with Friends and Family: More than three times a week    Attends Religious Services: Never    Active Member  of Clubs or Organizations: Yes    Attends Banker Meetings: Never    Marital Status: Widowed    Tobacco Counseling Counseling given: Not Answered Tobacco comments: tried snuff as a child, casual smoker as young adult   Clinical Intake:  Pre-visit preparation completed: Yes  Pain : No/denies pain     BMI - recorded: 34.74 Nutritional Status: BMI > 30  Obese Nutritional Risks: None Diabetes: No  How often do you need to have someone help you when you read instructions, pamphlets, or other written materials from your doctor or pharmacy?: 1 - Never  Interpreter Needed?: No  Information entered by :: Yong Grieser, RMA   Activities of Daily Living    04/05/2023    8:14 AM  In your present state of health, do you have any difficulty performing the following activities:  Hearing? 0  Vision? 0  Difficulty concentrating or making decisions? 0  Walking or climbing stairs? 0  Dressing or bathing? 0  Doing errands, shopping? 0  Comment SCAT transportation  Preparing Food and eating ? N  Using the Toilet? N  In the past six months, have you accidently leaked urine? N  Do you have problems with loss of bowel control? N  Managing your Medications? N  Managing your Finances? N  Housekeeping or managing your Housekeeping? N    Patient Care Team: Corwin Levins, MD as PCP - General Sherene Sires Charlaine Dalton, MD as Consulting Physician  (Pulmonary Disease) Ollen Gross, MD as Consulting Physician (Orthopedic Surgery) Ander Purpura, OD as Consulting Physician (Optometry)  Indicate any recent Medical Services you may have received from other than [facility] providers in the past year (date may be approximate).     Assessment:   This is a routine wellness examination for [facility].  Hearing/Vision screen Hearing Screening - Comments:: Denies hearing difficulties   Vision Screening - Comments:: Wears eyeglasses   Goals Addressed   None   Depression Screen    04/05/2023    8:32 AM 02/23/2023    2:03 PM 04/03/2022    2:04 PM 10/27/2021    9:59 AM 04/16/2021    2:22 PM 03/14/2021   10:50 AM 04/02/2020   12:35 PM  PHQ 2/9 Scores  PHQ - 2 Score 2 0 1 0 0 0 0  PHQ- 9 Score 2  1 0       Fall Risk    04/05/2023    8:24 AM 02/23/2023    2:03 PM 04/03/2022    2:07 PM 10/27/2021    9:59 AM 04/16/2021    2:21 PM  Fall Risk   Falls in the past year? 0 0 0 0 0  Number falls in past yr: 0 0 0  0  Injury with Fall? 0 0 0 0 0  Risk for fall due to : No Fall Risks No Fall Risks No Fall Risks No Fall Risks   Follow up Falls prevention discussed;Falls evaluation completed Falls evaluation completed Falls prevention discussed;Falls evaluation completed Falls evaluation completed     MEDICARE RISK AT HOME: Medicare Risk at Home Any stairs in or around the home?: No Home free of loose throw rugs in walkways, pet beds, electrical cords, etc?: Yes Adequate lighting in your home to reduce risk of falls?: Yes Life alert?: No Use of a cane, walker or w/c?: Yes (both) Grab bars in the bathroom?: Yes Shower chair or bench in shower?: Yes Elevated toilet seat or a handicapped toilet?: Yes  TIMED UP  AND GO:  Was the test performed?  No    Cognitive Function:    08/24/2017   11:37 AM 08/14/2016   11:44 AM 01/14/2015    1:59 PM  MMSE - Mini Mental State Exam  Orientation to time 5 5 5   Orientation to Place 5 5 5   Registration 3 3 3    Attention/ Calculation 5 4 2   Recall 2 2 2   Language- name 2 objects 2 2 2   Language- repeat 1 1 1   Language- follow 3 step command 3 3 3   Language- read & follow direction 1 1 1   Write a sentence 1 1 1   Copy design 1 1 1   Total score 29 28 26         04/05/2023    8:15 AM 04/03/2022    2:12 PM  6CIT Screen  What Year? 0 points 0 points  What month? 3 points 0 points  What time? 0 points 0 points  Count back from 20 0 points 0 points  Months in reverse 0 points 0 points  Repeat phrase 10 points 10 points  Total Score 13 points 10 points    Immunizations Immunization History  Administered Date(s) Administered   Moderna Sars-Covid-2 Vaccination 03/22/2019, 03/31/2019, 04/12/2019, 04/28/2019, 01/23/2020   Td 07/21/1995, 11/18/2009    TDAP status: Due, Education has been provided regarding the importance of this vaccine. Advised may receive this vaccine at local pharmacy or Health Dept. Aware to provide a copy of the vaccination record if obtained from local pharmacy or Health Dept. Verbalized acceptance and understanding.  Flu Vaccine status: Declined, Education has been provided regarding the importance of this vaccine but patient still declined. Advised may receive this vaccine at local pharmacy or Health Dept. Aware to provide a copy of the vaccination record if obtained from local pharmacy or Health Dept. Verbalized acceptance and understanding.  Pneumococcal vaccine status: Declined,  Education has been provided regarding the importance of this vaccine but patient still declined. Advised may receive this vaccine at local pharmacy or Health Dept. Aware to provide a copy of the vaccination record if obtained from local pharmacy or Health Dept. Verbalized acceptance and understanding.   Covid-19 vaccine status: Declined, Education has been provided regarding the importance of this vaccine but patient still declined. Advised may receive this vaccine at local pharmacy or Health  Dept.or vaccine clinic. Aware to provide a copy of the vaccination record if obtained from local pharmacy or Health Dept. Verbalized acceptance and understanding.  Qualifies for Shingles Vaccine? Yes   Zostavax completed  DECLINE   Shingrix Completed?: No.    Education has been provided regarding the importance of this vaccine. Patient has been advised to call insurance company to determine out of pocket expense if they have not yet received this vaccine. Advised may also receive vaccine at local pharmacy or Health Dept. Verbalized acceptance and understanding.  Screening Tests Health Maintenance  Topic Date Due   Zoster Vaccines- Shingrix (1 of 2) Never done   DTaP/Tdap/Td (3 - Tdap) 11/19/2019   COVID-19 Vaccine (6 - 2024-25 season) 10/18/2022   Pneumonia Vaccine 78+ Years old (1 of 1 - PCV) 04/28/2023 (Originally 08/20/2008)   Medicare Annual Wellness (AWV)  04/04/2024   DEXA SCAN  Completed   Hepatitis C Screening  Completed   HPV VACCINES  Aged Out   INFLUENZA VACCINE  Discontinued   Colonoscopy  Discontinued    Health Maintenance  Health Maintenance Due  Topic Date Due   Zoster Vaccines-  Shingrix (1 of 2) Never done   DTaP/Tdap/Td (3 - Tdap) 11/19/2019   COVID-19 Vaccine (6 - 2024-25 season) 10/18/2022    Colorectal cancer screening: No longer required.   Mammogram status: Completed 06/03/2022. Repeat every year  Bone Density status: Ordered 02/23/2023. Pt provided with contact info and advised to call to schedule appt.  Lung Cancer Screening: (Low Dose CT Chest recommended if Age 15-80 years, 20 pack-year currently smoking OR have quit w/in 15years.) does not qualify.   Lung Cancer Screening Referral: NA  Additional Screening:  Hepatitis C Screening: does not qualify; Completed 07/16/2015  Vision Screening: Recommended annual ophthalmology exams for early detection of glaucoma and other disorders of the eye. Is the patient up to date with their annual eye exam?  Yes   Who is the provider or what is the name of the office in which the patient attends annual eye exams? [facility] If pt is not established with a provider, would they like to be referred to a provider to establish care? No .   Dental Screening: Recommended annual dental exams for proper oral hygiene   Community Resource Referral / Chronic Care Management: CRR required this visit?  No   CCM required this visit?  No     Plan:     I have personally reviewed and noted the following in the patient's chart:   Medical and social history Use of alcohol, tobacco or illicit drugs  Current medications and supplements including opioid prescriptions. Patient is not currently taking opioid prescriptions. Functional ability and status Nutritional status Physical activity Advanced directives List of other physicians Hospitalizations, surgeries, and ER visits in previous 12 months Vitals Screenings to include cognitive, depression, and falls Referrals and appointments  In addition, I have reviewed and discussed with patient certain preventive protocols, quality metrics, and best practice recommendations. A written personalized care plan for preventive services as well as general preventive health recommendations were provided to patient.     Jesusita Jocelyn L Dandra Shambaugh, CMA   04/05/2023   After Visit Summary: (MyChart) Due to this being a telephonic visit, the after visit summary with patients personalized plan was offered to patient via MyChart   Nurse Notes: Patient is due for a Tdap, however declines all other vaccines.  She had no other concerns to address today.  I did remind patient to call to schedule for her DEXA screening.

## 2023-04-21 ENCOUNTER — Other Ambulatory Visit: Payer: Self-pay | Admitting: Internal Medicine

## 2023-04-21 ENCOUNTER — Other Ambulatory Visit: Payer: Self-pay

## 2023-04-22 ENCOUNTER — Other Ambulatory Visit: Payer: Medicare HMO

## 2023-05-04 ENCOUNTER — Ambulatory Visit: Payer: Medicare HMO | Admitting: Physician Assistant

## 2023-05-07 ENCOUNTER — Encounter: Payer: Self-pay | Admitting: Gastroenterology

## 2023-05-17 ENCOUNTER — Ambulatory Visit: Payer: Medicare HMO | Admitting: Physician Assistant

## 2023-05-17 ENCOUNTER — Ambulatory Visit
Admission: RE | Admit: 2023-05-17 | Discharge: 2023-05-17 | Disposition: A | Discharge status: Home / Self Care | Source: Ambulatory Visit | Attending: Internal Medicine | Admitting: Internal Medicine

## 2023-05-17 ENCOUNTER — Ambulatory Visit: Payer: Medicare HMO

## 2023-05-17 DIAGNOSIS — Z029 Encounter for administrative examinations, unspecified: Secondary | ICD-10-CM

## 2023-05-17 DIAGNOSIS — R413 Other amnesia: Secondary | ICD-10-CM

## 2023-05-17 NOTE — Progress Notes (Deleted)
 Assessment/Plan:     [name] is a very pleasant 78 y.o. year old RH female with a history of hypertension, hyperlipidemia, hypothyroidism, arthritis, osteoporosis,seen today for evaluation of memory loss. MoCA today is .  Workup is in progress.    Memory Impairment  MRI brain without contrast to assess for underlying structural abnormality and assess vascular load  Neurocognitive testing to further evaluate cognitive concerns and determine other underlying cause of memory changes, including potential contribution from sleep, anxiety, attention, or depression among others  Check B12, TSH Recommend good control of cardiovascular risk factors.   Continue to control mood as per PCP Folllow up in   Subjective:    The patient is accompanied by ***  who supplements the history.    How long did patient have memory difficulties?  For about.  Patient reports some difficulty remembering new information, recent conversations, names. repeats oneself?  Endorsed Disoriented when walking into a room?  Patient denies ***  Leaving objects in unusual places?  denies   Wandering behavior? denies   Any personality changes, or depression, anxiety? denies*** Hallucinations or paranoia? denies   Seizures? denies    Any sleep changes?  Sleeps well ***/does not sleep well. ***vivid dreams, REM behavior or sleepwalking   Sleep apnea? Denies.   Any hygiene concerns?  Denies.   Independent of bathing and dressing? Endorsed  Does the patient need help with medications?  is in charge *** Who is in charge of the finances?  is in charge   *** Any changes in appetite?   Denies. ***   Patient have trouble swallowing?  Denies.   Does the patient cook? No*** Any headaches?  Denies.   Chronic pain Endorsed, loweer back.   Ambulates with difficulty? Denies ***  Needs a cane*** Needs a walker *** to ambulate for stability.   Recent falls or head injuries? Denies.     Vision changes?  Denies any new  issues.  Has a history of*** Any strokelike symptoms? Denies.   Any tremors? Denies. *** Any anosmia? Denies.   Any incontinence of urine? Denies.   Any bowel dysfunction? Denies.      Patient lives with ***  History of heavy alcohol intake? Denies.   History of heavy tobacco use? Denies.   Family history of dementia?   *** with dementia  Does patient drive? No ***  No Known Allergies  Current Outpatient Medications  Medication Instructions   aspirin EC 325 mg, Oral, Daily   cholecalciferol (VITAMIN D) 1,000 Units, Daily   furosemide (LASIX) 20 MG tablet TAKE 1 TABLET(20 MG) BY MOUTH DAILY AS NEEDED   levothyroxine (SYNTHROID) 75 MCG tablet TAKE 1 TABLET EVERY DAY   lovastatin (MEVACOR) 20 mg, Oral, Daily   psyllium (HYDROCIL/METAMUCIL) 95 % PACK 1 packet, Daily PRN   traMADol (ULTRAM) 50 mg, Oral, Every 6 hours PRN   valsartan (DIOVAN) 80 mg, Oral, Daily     VITALS:  There were no vitals filed for this visit.    PHYSICAL EXAM   HEENT:  Normocephalic, atraumatic.  The superficial temporal arteries are without ropiness or tenderness. Cardiovascular: Regular rate and rhythm. Lungs: Clear to auscultation bilaterally. Neck: There are no carotid bruits noted bilaterally.  NEUROLOGICAL:     No data to display             08/24/2017   11:37 AM 08/14/2016   11:44 AM 01/14/2015    1:59 PM  MMSE - Mini Mental State  Exam  Orientation to time 5 5 5   Orientation to Place 5 5 5   Registration 3 3 3   Attention/ Calculation 5 4 2   Recall 2 2 2   Language- name 2 objects 2 2 2   Language- repeat 1 1 1   Language- follow 3 step command 3 3 3   Language- read & follow direction 1 1 1   Write a sentence 1 1 1   Copy design 1 1 1   Total score 29 28 26      Orientation:  Alert and oriented to person, place and not to time***. No aphasia or dysarthria. Fund of knowledge is appropriate. Recent and remote memory impaired.  Attention and concentration are reduced***.  Able to name objects  and repeat phrases /5 ***. Delayed recall  / *** Cranial nerves: There is good facial symmetry. Extraocular muscles are intact and visual fields are full to confrontational testing. Speech is fluent and clear. No tongue deviation. Hearing is intact to conversational tone.*** Tone: Tone is good throughout. Sensation: Sensation is intact to light touch.  Vibration is intact at the bilateral big toe.  Coordination: The patient has no difficulty with RAM's or FNF bilaterally. Normal finger to nose  Motor: Strength is 5/5 in the bilateral upper and lower extremities. There is no pronator drift. There are no fasciculations noted. DTR's: Deep tendon reflexes are 2/4 bilaterally. Gait and Station: The patient is able to ambulate without difficulty. Gait is cautious and narrow. Stride length is normal ***      Thank you for allowing [place] the opportunity to participate in the care of this nice patient. Please do not hesitate to contact us for any questions or concerns.   Total time spent on today's visit was *** minutes dedicated to this patient today, preparing to see patient, examining the patient, ordering tests and/or medications and counseling the patient, documenting clinical information in the EHR or other health record, independently interpreting results and communicating results to the patient/family, discussing treatment and goals, answering patient's questions and coordinating care.  Cc:  Corwin Levins, MD  Marlowe Kays 05/17/2023 7:11 AM

## 2023-05-18 ENCOUNTER — Encounter: Payer: Self-pay | Admitting: Physician Assistant

## 2023-05-25 ENCOUNTER — Telehealth: Payer: Self-pay | Admitting: Internal Medicine

## 2023-05-25 MED ORDER — TRAMADOL HCL 50 MG PO TABS: 50.0000 mg | ORAL_TABLET | Freq: Four times a day (QID) | ORAL | 2 refills | Status: AC | PRN

## 2023-05-25 NOTE — Telephone Encounter (Signed)
 Copied from CRM [id number]. Topic: General - Other >> May 25, 2023  2:57 PM Rodman Pickle T wrote: Reason for CRM: patient daughter tiffany is calling in regarding the mri patient is suppose to have but she has back problems as well and could not lay down for the mri so patient daughter is wanting to know if patient can have something to take for her back are is there another way for her to to do the mri

## 2023-05-25 NOTE — Telephone Encounter (Signed)
 I dont know of any MRI where the patient does not have to lie down, sorry  I can refill the tramadol for pain, though - done erx

## 2023-05-31 NOTE — Telephone Encounter (Signed)
 Called and let Pt daughter know.

## 2023-07-05 ENCOUNTER — Telehealth: Payer: Self-pay | Admitting: Internal Medicine

## 2023-07-05 NOTE — Telephone Encounter (Signed)
 Copied from CRM [id number]. Topic: Clinical - Request for Lab/Test Order >> Jul 05, 2023 12:51 PM Juleen Oakland F wrote: Reason for CRM: Patient daughter Jyl Or needs order for patients MRI to go to either [facility] location or [facility] in [place] since they both are able to do stand up MRI's. Please call Tiffany with an update at [phone]

## 2023-07-05 NOTE — Telephone Encounter (Signed)
 Please clarify -   Patient has a pending order for  Brain MRI, but there is also another message about the lower back   So we need to clarify , does the daughter mean Brain MRI or Lumbar MRI?

## 2023-07-07 NOTE — Telephone Encounter (Signed)
 Called and spoke with Pt and she is going to tell her daughter to call back as she did not know which one , When daughter calls back please relay Dr.John message and provide feedback.

## 2023-08-23 ENCOUNTER — Ambulatory Visit (INDEPENDENT_AMBULATORY_CARE_PROVIDER_SITE_OTHER): Payer: Medicare HMO | Admitting: Internal Medicine

## 2023-08-23 ENCOUNTER — Encounter: Payer: Self-pay | Admitting: Internal Medicine

## 2023-08-23 VITALS — BP 124/76 | HR 90 | Temp 97.6°F | Ht 59.0 in | Wt 156.0 lb

## 2023-08-23 DIAGNOSIS — E538 Deficiency of other specified B group vitamins: Secondary | ICD-10-CM | POA: Insufficient documentation

## 2023-08-23 DIAGNOSIS — R413 Other amnesia: Secondary | ICD-10-CM | POA: Diagnosis not present

## 2023-08-23 DIAGNOSIS — G8929 Other chronic pain: Secondary | ICD-10-CM

## 2023-08-23 DIAGNOSIS — I10 Essential (primary) hypertension: Secondary | ICD-10-CM | POA: Diagnosis not present

## 2023-08-23 DIAGNOSIS — M545 Low back pain, unspecified: Secondary | ICD-10-CM | POA: Diagnosis not present

## 2023-08-23 DIAGNOSIS — R739 Hyperglycemia, unspecified: Secondary | ICD-10-CM | POA: Diagnosis not present

## 2023-08-23 NOTE — Patient Instructions (Signed)
 Please continue all other medications as before, and refills have been done if requested.  Please have the pharmacy call with any other refills you may need.  Please keep your appointments with your specialists as you may have planned  You will be contacted regarding the referral for: MRI with standing or sitting up, and Neurology  We can hold on lab testing today

## 2023-08-23 NOTE — Assessment & Plan Note (Addendum)
 Lab Results  Component Value Date   HGBA1C 6.2 02/23/2023   Stable, pt to continue current medical treatment  - diet, wt control

## 2023-08-23 NOTE — Assessment & Plan Note (Signed)
 BP Readings from Last 3 Encounters:  08/23/23 124/76  02/23/23 130/84  04/28/22 126/74   Stable, pt to continue medical treatment diovan  80 qd

## 2023-08-23 NOTE — Assessment & Plan Note (Signed)
 With worsening, will need brain MRI  - open MRI type per duaghter request, and refer neurology as did not be contacted after last referral.

## 2023-08-23 NOTE — Assessment & Plan Note (Signed)
 Lab Results  Component Value Date   VITAMINB12 221 02/23/2023   Low, to start oral replacement - b12 1000 mcg qd

## 2023-08-23 NOTE — Assessment & Plan Note (Signed)
 Mild to mod intermittent, for tylenol  prn

## 2023-08-23 NOTE — Progress Notes (Signed)
 Patient ID: [name], female   DOB: [birth year], 78 y.o.   MRN: [id number]        Chief Complaint: follow up memory loss, low b12, chronic lbp, hyperglycemia        HPI:  [name] is a 78 y.o. female here with family daughter, pt overall doing ok but has worsening ST memory dysfunction but now agitation, keeps asking same questions x 5 times.  Pt denies chest pain, increased sob or doe, wheezing, orthopnea, PND, increased LE swelling, palpitations, dizziness or syncope.   Pt denies polydipsia, polyuria, or new focal neuro s/s.    Pt denies fever, wt loss, night sweats, loss of appetite, or other constitutional symptoms  Pt continues to have recurring LBP without change in severity, bowel or bladder change, fever, wt loss,  worsening LE pain/numbness/weakness, gait change or falls.        Wt Readings from Last 3 Encounters:  08/23/23 156 lb (70.8 kg)  04/05/23 172 lb (78 kg)  02/23/23 172 lb (78 kg)   BP Readings from Last 3 Encounters:  08/23/23 124/76  02/23/23 130/84  04/28/22 126/74         Past Medical History:  Diagnosis Date   Allergic rhinitis, cause unspecified 12/26/2010   Cataract    1 eye-? which   DEGENERATIVE JOINT DISEASE 11/25/2006   in knees    DEPRESSION 11/25/2006   DIVERTICULOSIS, COLON 12/04/2006   DIZZINESS 11/18/2009   DJD (degenerative joint disease)    Bilateral Hip   HYPERCHOLESTEROLEMIA 11/25/2006   HYPERLIPIDEMIA 11/18/2009   HYPERPARATHYROIDISM, HX OF 11/25/2006   HYPERTENSION 07/15/2007   HYPOTHYROIDISM 11/25/2006   OBESITY 12/04/2006   OSTEOPENIA 11/25/2006   OTITIS MEDIA, ACUTE, LEFT 11/18/2009   PALPITATIONS, RECURRENT 07/20/2008   POSTMENOPAUSAL STATUS 11/25/2006   PULMONARY NODULE 11/25/2006   Vitamin D  deficiency    Past Surgical History:  Procedure Laterality Date   ABDOMINAL HYSTERECTOMY     COLONOSCOPY     ENDOVENOUS ABLATION SAPHENOUS VEIN W/ LASER Right 09/04/2021   endovenous laser ablation right small saphenous vein  and stab phlebectomy 10-20 incisions right leg by Gaile New MD   left knee     PARATHYROIDECTOMY Right 11/20/2014   Procedure: RIGHT PARATHYROIDECTOMY;  Surgeon: Krystal Spinner, MD;  [facility] OR;  Service: General;  Laterality: Right;   POLYPECTOMY     right wrist     THYROID  SURGERY     early 70's   TOTAL HIP ARTHROPLASTY     left 3/05, right 2002   TOTAL HIP ARTHROPLASTY Left 2005   TOTAL KNEE ARTHROPLASTY Left 08/26/2020   Procedure: TOTAL KNEE ARTHROPLASTY;  Surgeon: Melodi Lerner, MD;  Location: [facility] ORS;  Service: Orthopedics;  Laterality: Left;    UMBILICAL HERNIA REPAIR      reports that she quit smoking about 55 years ago. Her smoking use included cigarettes. She started smoking about 56 years ago. She has never been exposed to tobacco smoke. She quit smokeless tobacco use about 48 years ago. She reports that she does not drink alcohol and does not use drugs. family history includes Breast cancer in her mother; Hypertension in an other family member; Stroke in her father. No Known Allergies Current Outpatient Medications on File Prior to Visit  Medication Sig Dispense Refill   aspirin  325 MG EC tablet Take 1 tablet (325 mg total) by mouth daily. (Patient taking differently: Take 325 mg by mouth daily as needed for pain (headache).) 90 tablet 3  cholecalciferol (VITAMIN D ) 1000 UNITS tablet Take 1,000 Units by mouth daily.     furosemide  (LASIX ) 20 MG tablet TAKE 1 TABLET(20 MG) BY MOUTH DAILY AS NEEDED 30 tablet 5   levothyroxine  (SYNTHROID ) 75 MCG tablet TAKE 1 TABLET EVERY DAY 90 tablet 3   lovastatin  (MEVACOR ) 20 MG tablet TAKE 1 TABLET EVERY DAY 90 tablet 3   psyllium (HYDROCIL/METAMUCIL) 95 % PACK Take 1 packet by mouth daily as needed (constipation).      traMADol  (ULTRAM ) 50 MG tablet Take 1 tablet (50 mg total) by mouth every 6 (six) hours as needed. 60 tablet 2   valsartan  (DIOVAN ) 80 MG tablet TAKE 1 TABLET EVERY DAY 90 tablet 3   No current  facility-administered medications on file prior to visit.        ROS:  All others reviewed and negative.  Objective        PE:  BP 124/76 (BP Location: Left Arm, Patient Position: Sitting)   Pulse 90   Temp 97.6 F (36.4 C) (Temporal)   Ht 4' 11 (1.499 m)   Wt 156 lb (70.8 kg)   SpO2 99%   BMI 31.51 kg/m                 Constitutional: Pt appears in NAD               HENT: Head: NCAT.                Right Ear: External ear normal.                 Left Ear: External ear normal.                Eyes: . Pupils are equal, round, and reactive to light. Conjunctivae and EOM are normal               Nose: without d/c or deformity               Neck: Neck supple. Gross normal ROM               Cardiovascular: Normal rate and regular rhythm.                 Pulmonary/Chest: Effort normal and breath sounds without rales or wheezing.                Abd:  Soft, NT, ND, + BS, no organomegaly               Neurological: Pt is alert. At baseline orientation, motor grossly intact but pt confused               Skin: Skin is warm. No rashes, no other new lesions, LE edema - none               Psychiatric: Pt behavior is normal without agitation   Micro: none  Cardiac tracings I have personally interpreted today:  none  Pertinent Radiological findings (summarize): none   Lab Results  Component Value Date   WBC 3.9 (L) 02/23/2023   HGB 13.1 02/23/2023   HCT 39.3 02/23/2023   PLT 258.0 02/23/2023   GLUCOSE 82 02/23/2023   CHOL 199 02/23/2023   TRIG 59.0 02/23/2023   HDL 72.90 02/23/2023   LDLDIRECT 118.5 01/01/2012   LDLCALC 114 (H) 02/23/2023   ALT 7 02/23/2023   AST 17 02/23/2023   NA 142 02/23/2023   K 3.8 02/23/2023   CL 104  02/23/2023   CREATININE 0.64 02/23/2023   BUN 14 02/23/2023   CO2 28 02/23/2023   TSH 4.41 02/23/2023   INR 1.1 08/16/2020   HGBA1C 6.2 02/23/2023   Assessment/Plan:  [name] is a 78 y.o. Black or African American [2] female with  has a past  medical history of Allergic rhinitis, cause unspecified (12/26/2010), Cataract, DEGENERATIVE JOINT DISEASE (11/25/2006), DEPRESSION (11/25/2006), DIVERTICULOSIS, COLON (12/04/2006), DIZZINESS (11/18/2009), DJD (degenerative joint disease), HYPERCHOLESTEROLEMIA (11/25/2006), HYPERLIPIDEMIA (11/18/2009), HYPERPARATHYROIDISM, HX OF (11/25/2006), HYPERTENSION (07/15/2007), HYPOTHYROIDISM (11/25/2006), OBESITY (12/04/2006), OSTEOPENIA (11/25/2006), OTITIS MEDIA, ACUTE, LEFT (11/18/2009), PALPITATIONS, RECURRENT (07/20/2008), POSTMENOPAUSAL STATUS (11/25/2006), PULMONARY NODULE (11/25/2006), and Vitamin D  deficiency.  Chronic low back pain Mild to mod intermittent, for tylenol  prn  Hyperglycemia Lab Results  Component Value Date   HGBA1C 6.2 02/23/2023   Stable, pt to continue current medical treatment  - diet, wt control  Essential hypertension BP Readings from Last 3 Encounters:  08/23/23 124/76  02/23/23 130/84  04/28/22 126/74   Stable, pt to continue medical treatment diovan  80 qd   Memory loss With worsening, will need brain MRI  - open MRI type per duaghter request, and refer neurology as did not be contacted after last referral.  B12 deficiency Lab Results  Component Value Date   VITAMINB12 221 02/23/2023   Low, to start oral replacement - b12 1000 mcg qd   Followup: Return in about 6 months (around 02/23/2024).  Lynwood Rush, MD 08/23/2023 8:24 PM [facility] Internal Medicine

## 2023-10-30 ENCOUNTER — Other Ambulatory Visit: Payer: Self-pay | Admitting: Internal Medicine

## 2024-02-24 ENCOUNTER — Ambulatory Visit: Admitting: Internal Medicine

## 2024-04-05 ENCOUNTER — Ambulatory Visit: Payer: Medicare HMO
